# Patient Record
Sex: Male | Born: 1955 | Race: Black or African American | Hispanic: No | Marital: Married | Smoking: Never smoker
Health system: Southern US, Community
[De-identification: ages and names within clinical notes are randomized; demographics above are authoritative.]

## PROBLEM LIST (undated history)

## (undated) DIAGNOSIS — R05 Cough: Secondary | ICD-10-CM

## (undated) DIAGNOSIS — Z973 Presence of spectacles and contact lenses: Secondary | ICD-10-CM

## (undated) DIAGNOSIS — E739 Lactose intolerance, unspecified: Secondary | ICD-10-CM

## (undated) DIAGNOSIS — M549 Dorsalgia, unspecified: Secondary | ICD-10-CM

## (undated) DIAGNOSIS — M545 Low back pain, unspecified: Secondary | ICD-10-CM

## (undated) DIAGNOSIS — E119 Type 2 diabetes mellitus without complications: Secondary | ICD-10-CM

## (undated) DIAGNOSIS — M791 Myalgia, unspecified site: Secondary | ICD-10-CM

## (undated) DIAGNOSIS — G8929 Other chronic pain: Secondary | ICD-10-CM

## (undated) DIAGNOSIS — C61 Malignant neoplasm of prostate: Secondary | ICD-10-CM

## (undated) DIAGNOSIS — L853 Xerosis cutis: Secondary | ICD-10-CM

## (undated) DIAGNOSIS — J301 Allergic rhinitis due to pollen: Secondary | ICD-10-CM

## (undated) DIAGNOSIS — M7989 Other specified soft tissue disorders: Secondary | ICD-10-CM

## (undated) DIAGNOSIS — Z91018 Allergy to other foods: Secondary | ICD-10-CM

## (undated) DIAGNOSIS — E785 Hyperlipidemia, unspecified: Secondary | ICD-10-CM

## (undated) DIAGNOSIS — M436 Torticollis: Secondary | ICD-10-CM

## (undated) DIAGNOSIS — R059 Cough, unspecified: Secondary | ICD-10-CM

## (undated) DIAGNOSIS — H43399 Other vitreous opacities, unspecified eye: Secondary | ICD-10-CM

## (undated) DIAGNOSIS — H409 Unspecified glaucoma: Secondary | ICD-10-CM

## (undated) DIAGNOSIS — K219 Gastro-esophageal reflux disease without esophagitis: Secondary | ICD-10-CM

## (undated) DIAGNOSIS — G479 Sleep disorder, unspecified: Secondary | ICD-10-CM

## (undated) DIAGNOSIS — N529 Male erectile dysfunction, unspecified: Secondary | ICD-10-CM

## (undated) DIAGNOSIS — Z972 Presence of dental prosthetic device (complete) (partial): Secondary | ICD-10-CM

## (undated) DIAGNOSIS — N189 Chronic kidney disease, unspecified: Secondary | ICD-10-CM

## (undated) DIAGNOSIS — L299 Pruritus, unspecified: Secondary | ICD-10-CM

## (undated) DIAGNOSIS — H539 Unspecified visual disturbance: Secondary | ICD-10-CM

## (undated) DIAGNOSIS — G629 Polyneuropathy, unspecified: Secondary | ICD-10-CM

## (undated) DIAGNOSIS — M2550 Pain in unspecified joint: Secondary | ICD-10-CM

## (undated) DIAGNOSIS — E559 Vitamin D deficiency, unspecified: Secondary | ICD-10-CM

## (undated) DIAGNOSIS — G4730 Sleep apnea, unspecified: Secondary | ICD-10-CM

## (undated) DIAGNOSIS — R972 Elevated prostate specific antigen [PSA]: Secondary | ICD-10-CM

## (undated) DIAGNOSIS — R21 Rash and other nonspecific skin eruption: Secondary | ICD-10-CM

## (undated) DIAGNOSIS — R252 Cramp and spasm: Secondary | ICD-10-CM

## (undated) DIAGNOSIS — N289 Disorder of kidney and ureter, unspecified: Secondary | ICD-10-CM

## (undated) DIAGNOSIS — M1990 Unspecified osteoarthritis, unspecified site: Secondary | ICD-10-CM

## (undated) DIAGNOSIS — I10 Essential (primary) hypertension: Secondary | ICD-10-CM

## (undated) DIAGNOSIS — R12 Heartburn: Secondary | ICD-10-CM

## (undated) HISTORY — DX: Torticollis: M43.6

## (undated) HISTORY — DX: Cough: R05

## (undated) HISTORY — DX: Other vitreous opacities, unspecified eye: H43.399

## (undated) HISTORY — DX: Pruritus, unspecified: L29.9

## (undated) HISTORY — DX: Unspecified visual disturbance: H53.9

## (undated) HISTORY — DX: Myalgia, unspecified site: M79.10

## (undated) HISTORY — DX: Allergy to other foods: Z91.018

## (undated) HISTORY — DX: Sleep disorder, unspecified: G47.9

## (undated) HISTORY — DX: Chronic kidney disease, unspecified: N18.9

## (undated) HISTORY — DX: Other specified soft tissue disorders: M79.89

## (undated) HISTORY — PX: COLONOSCOPY: SHX174

## (undated) HISTORY — PX: EYE SURGERY: SHX253

## (undated) HISTORY — DX: Cough, unspecified: R05.9

## (undated) HISTORY — DX: Type 2 diabetes mellitus without complications: E11.9

## (undated) HISTORY — DX: Rash and other nonspecific skin eruption: R21

## (undated) HISTORY — DX: Allergic rhinitis due to pollen: J30.1

## (undated) HISTORY — DX: Disorder of kidney and ureter, unspecified: N28.9

## (undated) HISTORY — DX: Pain in unspecified joint: M25.50

## (undated) HISTORY — PX: PROSTATE BIOPSY: SHX241

## (undated) HISTORY — DX: Low back pain: M54.5

## (undated) HISTORY — DX: Xerosis cutis: L85.3

## (undated) HISTORY — DX: Vitamin D deficiency, unspecified: E55.9

## (undated) HISTORY — PX: DENTAL SURGERY: SHX609

## (undated) HISTORY — DX: Hyperlipidemia, unspecified: E78.5

## (undated) HISTORY — DX: Heartburn: R12

## (undated) HISTORY — DX: Cramp and spasm: R25.2

## (undated) HISTORY — DX: Lactose intolerance, unspecified: E73.9

## (undated) HISTORY — DX: Low back pain, unspecified: M54.50

---

## 1997-12-12 ENCOUNTER — Ambulatory Visit (HOSPITAL_COMMUNITY): Admission: RE | Admit: 1997-12-12 | Discharge: 1997-12-12 | Payer: Self-pay | Admitting: Nephrology

## 1998-01-04 ENCOUNTER — Emergency Department (HOSPITAL_COMMUNITY): Admission: EM | Admit: 1998-01-04 | Discharge: 1998-01-04 | Payer: Self-pay | Admitting: Emergency Medicine

## 1998-02-12 ENCOUNTER — Ambulatory Visit (HOSPITAL_COMMUNITY): Admission: RE | Admit: 1998-02-12 | Discharge: 1998-02-12 | Payer: Self-pay | Admitting: Nephrology

## 1998-04-10 ENCOUNTER — Ambulatory Visit (HOSPITAL_COMMUNITY): Admission: RE | Admit: 1998-04-10 | Discharge: 1998-04-10 | Payer: Self-pay | Admitting: Neurosurgery

## 1998-04-24 ENCOUNTER — Ambulatory Visit (HOSPITAL_COMMUNITY): Admission: RE | Admit: 1998-04-24 | Discharge: 1998-04-24 | Payer: Self-pay | Admitting: Neurosurgery

## 1998-05-08 ENCOUNTER — Ambulatory Visit (HOSPITAL_COMMUNITY): Admission: RE | Admit: 1998-05-08 | Discharge: 1998-05-08 | Payer: Self-pay | Admitting: Neurosurgery

## 1998-09-08 ENCOUNTER — Encounter: Admission: RE | Admit: 1998-09-08 | Discharge: 1998-12-07 | Payer: Self-pay

## 1998-09-30 ENCOUNTER — Emergency Department (HOSPITAL_COMMUNITY): Admission: EM | Admit: 1998-09-30 | Discharge: 1998-09-30 | Payer: Self-pay | Admitting: Emergency Medicine

## 1998-09-30 ENCOUNTER — Encounter: Payer: Self-pay | Admitting: Emergency Medicine

## 2000-02-04 ENCOUNTER — Encounter: Payer: Self-pay | Admitting: Nephrology

## 2000-02-04 ENCOUNTER — Ambulatory Visit (HOSPITAL_COMMUNITY): Admission: RE | Admit: 2000-02-04 | Discharge: 2000-02-04 | Payer: Self-pay | Admitting: Nephrology

## 2000-02-18 ENCOUNTER — Ambulatory Visit (HOSPITAL_COMMUNITY): Admission: RE | Admit: 2000-02-18 | Discharge: 2000-02-18 | Payer: Self-pay | Admitting: Nephrology

## 2000-02-18 ENCOUNTER — Encounter: Payer: Self-pay | Admitting: Nephrology

## 2000-03-03 ENCOUNTER — Ambulatory Visit (HOSPITAL_COMMUNITY): Admission: RE | Admit: 2000-03-03 | Discharge: 2000-03-03 | Payer: Self-pay | Admitting: Nephrology

## 2000-03-03 ENCOUNTER — Encounter: Payer: Self-pay | Admitting: Nephrology

## 2000-11-20 ENCOUNTER — Ambulatory Visit (HOSPITAL_BASED_OUTPATIENT_CLINIC_OR_DEPARTMENT_OTHER): Admission: RE | Admit: 2000-11-20 | Discharge: 2000-11-20 | Payer: Self-pay | Admitting: Nephrology

## 2001-02-06 ENCOUNTER — Ambulatory Visit (HOSPITAL_BASED_OUTPATIENT_CLINIC_OR_DEPARTMENT_OTHER): Admission: RE | Admit: 2001-02-06 | Discharge: 2001-02-06 | Payer: Self-pay | Admitting: Nephrology

## 2013-01-22 ENCOUNTER — Emergency Department (HOSPITAL_COMMUNITY): Payer: Medicare Other

## 2013-01-22 ENCOUNTER — Emergency Department (HOSPITAL_COMMUNITY)
Admission: EM | Admit: 2013-01-22 | Discharge: 2013-01-22 | Disposition: A | Discharge status: Home / Self Care | Payer: Medicare Other | Attending: Emergency Medicine | Admitting: Emergency Medicine

## 2013-01-22 ENCOUNTER — Encounter (HOSPITAL_COMMUNITY): Payer: Self-pay | Admitting: Emergency Medicine

## 2013-01-22 DIAGNOSIS — Y929 Unspecified place or not applicable: Secondary | ICD-10-CM | POA: Insufficient documentation

## 2013-01-22 DIAGNOSIS — I10 Essential (primary) hypertension: Secondary | ICD-10-CM | POA: Insufficient documentation

## 2013-01-22 DIAGNOSIS — Z79899 Other long term (current) drug therapy: Secondary | ICD-10-CM | POA: Insufficient documentation

## 2013-01-22 DIAGNOSIS — H409 Unspecified glaucoma: Secondary | ICD-10-CM | POA: Insufficient documentation

## 2013-01-22 DIAGNOSIS — S82001A Unspecified fracture of right patella, initial encounter for closed fracture: Secondary | ICD-10-CM

## 2013-01-22 DIAGNOSIS — Z7982 Long term (current) use of aspirin: Secondary | ICD-10-CM | POA: Insufficient documentation

## 2013-01-22 DIAGNOSIS — W19XXXA Unspecified fall, initial encounter: Secondary | ICD-10-CM | POA: Insufficient documentation

## 2013-01-22 DIAGNOSIS — Y9389 Activity, other specified: Secondary | ICD-10-CM | POA: Insufficient documentation

## 2013-01-22 DIAGNOSIS — S82009A Unspecified fracture of unspecified patella, initial encounter for closed fracture: Secondary | ICD-10-CM | POA: Insufficient documentation

## 2013-01-22 HISTORY — DX: Essential (primary) hypertension: I10

## 2013-01-22 HISTORY — DX: Unspecified glaucoma: H40.9

## 2013-01-22 MED ORDER — OXYCODONE-ACETAMINOPHEN 5-325 MG PO TABS: 1.0000 | ORAL_TABLET | ORAL | Status: DC | PRN

## 2013-01-22 MED ORDER — IBUPROFEN 200 MG PO TABS
600.0000 mg | ORAL_TABLET | Freq: Once | ORAL | Status: AC
  Administered 2013-01-22: 600 mg via ORAL
  Filled 2013-01-22: qty 3

## 2013-01-22 MED ORDER — OXYCODONE-ACETAMINOPHEN 5-325 MG PO TABS
2.0000 | ORAL_TABLET | Freq: Once | ORAL | Status: AC
  Administered 2013-01-22: 2 via ORAL
  Filled 2013-01-22: qty 2
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
## 2013-01-23 ENCOUNTER — Other Ambulatory Visit: Payer: Self-pay | Admitting: Orthopedic Surgery

## 2013-01-23 ENCOUNTER — Encounter (HOSPITAL_BASED_OUTPATIENT_CLINIC_OR_DEPARTMENT_OTHER): Payer: Self-pay | Admitting: *Deleted

## 2013-01-23 NOTE — Progress Notes (Signed)
Pt goes to the [facility]-dr woods-has been a yr-no cardiology problems-does have sleep apnea and uses a cpap-to bring all meds and cpap and overnight bag in case anesthesia wants him to stay overnight -will plan to go home if ok-will need istat-ekg-add on here after 430pm

## 2013-01-23 NOTE — Progress Notes (Signed)
Pt has had very little surgeries-no problems
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
## 2013-01-24 ENCOUNTER — Ambulatory Visit (HOSPITAL_BASED_OUTPATIENT_CLINIC_OR_DEPARTMENT_OTHER): Payer: Medicare Other | Admitting: *Deleted

## 2013-01-24 ENCOUNTER — Encounter (HOSPITAL_BASED_OUTPATIENT_CLINIC_OR_DEPARTMENT_OTHER): Payer: Self-pay | Admitting: *Deleted

## 2013-01-24 ENCOUNTER — Ambulatory Visit (HOSPITAL_BASED_OUTPATIENT_CLINIC_OR_DEPARTMENT_OTHER)
Admission: RE | Admit: 2013-01-24 | Discharge: 2013-01-24 | Disposition: A | Discharge status: Home / Self Care | Payer: Medicare Other | Source: Ambulatory Visit | Attending: Orthopedic Surgery | Admitting: Orthopedic Surgery

## 2013-01-24 ENCOUNTER — Encounter (HOSPITAL_BASED_OUTPATIENT_CLINIC_OR_DEPARTMENT_OTHER)
Admission: RE | Disposition: A | Discharge status: Home / Self Care | Payer: Self-pay | Source: Ambulatory Visit | Attending: Orthopedic Surgery

## 2013-01-24 DIAGNOSIS — Z7982 Long term (current) use of aspirin: Secondary | ICD-10-CM | POA: Insufficient documentation

## 2013-01-24 DIAGNOSIS — M545 Low back pain, unspecified: Secondary | ICD-10-CM | POA: Insufficient documentation

## 2013-01-24 DIAGNOSIS — H409 Unspecified glaucoma: Secondary | ICD-10-CM | POA: Insufficient documentation

## 2013-01-24 DIAGNOSIS — I10 Essential (primary) hypertension: Secondary | ICD-10-CM | POA: Insufficient documentation

## 2013-01-24 DIAGNOSIS — G4730 Sleep apnea, unspecified: Secondary | ICD-10-CM | POA: Insufficient documentation

## 2013-01-24 DIAGNOSIS — N289 Disorder of kidney and ureter, unspecified: Secondary | ICD-10-CM | POA: Insufficient documentation

## 2013-01-24 DIAGNOSIS — S82001A Unspecified fracture of right patella, initial encounter for closed fracture: Secondary | ICD-10-CM

## 2013-01-24 DIAGNOSIS — S82009A Unspecified fracture of unspecified patella, initial encounter for closed fracture: Secondary | ICD-10-CM | POA: Insufficient documentation

## 2013-01-24 DIAGNOSIS — Y9389 Activity, other specified: Secondary | ICD-10-CM | POA: Insufficient documentation

## 2013-01-24 DIAGNOSIS — K219 Gastro-esophageal reflux disease without esophagitis: Secondary | ICD-10-CM | POA: Insufficient documentation

## 2013-01-24 DIAGNOSIS — Z79899 Other long term (current) drug therapy: Secondary | ICD-10-CM | POA: Insufficient documentation

## 2013-01-24 DIAGNOSIS — W010XXA Fall on same level from slipping, tripping and stumbling without subsequent striking against object, initial encounter: Secondary | ICD-10-CM | POA: Insufficient documentation

## 2013-01-24 HISTORY — DX: Presence of dental prosthetic device (complete) (partial): Z97.2

## 2013-01-24 HISTORY — DX: Other chronic pain: G89.29

## 2013-01-24 HISTORY — DX: Polyneuropathy, unspecified: G62.9

## 2013-01-24 HISTORY — DX: Dorsalgia, unspecified: M54.9

## 2013-01-24 HISTORY — DX: Unspecified osteoarthritis, unspecified site: M19.90

## 2013-01-24 HISTORY — PX: ORIF PATELLA: SHX5033

## 2013-01-24 HISTORY — DX: Gastro-esophageal reflux disease without esophagitis: K21.9

## 2013-01-24 HISTORY — DX: Sleep apnea, unspecified: G47.30

## 2013-01-24 HISTORY — DX: Presence of spectacles and contact lenses: Z97.3

## 2013-01-24 LAB — POCT I-STAT, CHEM 8
Creatinine, Ser: 1.2 mg/dL (ref 0.50–1.35)
HCT: 44 % (ref 39.0–52.0)
Hemoglobin: 15 g/dL (ref 13.0–17.0)
Potassium: 3.8 mEq/L (ref 3.5–5.1)
Sodium: 141 mEq/L (ref 135–145)
TCO2: 25 mmol/L (ref 0–100)

## 2013-01-24 SURGERY — OPEN REDUCTION INTERNAL FIXATION (ORIF) PATELLA
Anesthesia: General | Site: Knee | Laterality: Right | Wound class: Clean

## 2013-01-24 MED ORDER — OXYCODONE-ACETAMINOPHEN 5-325 MG PO TABS
1.0000 | ORAL_TABLET | ORAL | Status: DC | PRN
Start: 2013-01-24 — End: 2013-06-11

## 2013-01-24 MED ORDER — DEXAMETHASONE SODIUM PHOSPHATE 10 MG/ML IJ SOLN
INTRAMUSCULAR | Status: DC | PRN
  Administered 2013-01-24: 10 mg via INTRAVENOUS

## 2013-01-24 MED ORDER — OXYCODONE HCL 5 MG PO TABS: 5.0000 mg | ORAL_TABLET | Freq: Once | ORAL | Status: DC | PRN

## 2013-01-24 MED ORDER — FENTANYL CITRATE 0.05 MG/ML IJ SOLN
INTRAMUSCULAR | Status: DC | PRN
  Administered 2013-01-24 (×4): 25 ug via INTRAVENOUS

## 2013-01-24 MED ORDER — LIDOCAINE HCL (CARDIAC) 20 MG/ML IV SOLN
INTRAVENOUS | Status: DC | PRN
  Administered 2013-01-24: 60 mg via INTRAVENOUS

## 2013-01-24 MED ORDER — METOCLOPRAMIDE HCL 5 MG/ML IJ SOLN: 10.0000 mg | Freq: Once | INTRAMUSCULAR | Status: DC | PRN

## 2013-01-24 MED ORDER — BUPIVACAINE-EPINEPHRINE PF 0.5-1:200000 % IJ SOLN
INTRAMUSCULAR | Status: DC | PRN
  Administered 2013-01-24: 25 mL

## 2013-01-24 MED ORDER — HYDROMORPHONE HCL PF 1 MG/ML IJ SOLN: 0.2500 mg | INTRAMUSCULAR | Status: DC | PRN

## 2013-01-24 MED ORDER — OXYCODONE HCL 5 MG/5ML PO SOLN: 5.0000 mg | Freq: Once | ORAL | Status: DC | PRN

## 2013-01-24 MED ORDER — DEXTROSE-NACL 5-0.45 % IV SOLN: INTRAVENOUS | Status: DC

## 2013-01-24 MED ORDER — ONDANSETRON HCL 4 MG/2ML IJ SOLN
INTRAMUSCULAR | Status: DC | PRN
  Administered 2013-01-24: 4 mg via INTRAVENOUS

## 2013-01-24 MED ORDER — PROPOFOL 10 MG/ML IV BOLUS
INTRAVENOUS | Status: DC | PRN
  Administered 2013-01-24: 250 mg via INTRAVENOUS

## 2013-01-24 MED ORDER — LACTATED RINGERS IV SOLN
INTRAVENOUS | Status: DC
  Administered 2013-01-24 (×2): via INTRAVENOUS

## 2013-01-24 MED ORDER — FENTANYL CITRATE 0.05 MG/ML IJ SOLN
50.0000 ug | INTRAMUSCULAR | Status: DC | PRN
  Administered 2013-01-24: 100 ug via INTRAVENOUS

## 2013-01-24 MED ORDER — CEFAZOLIN SODIUM-DEXTROSE 2-3 GM-% IV SOLR
2.0000 g | INTRAVENOUS | Status: AC
  Administered 2013-01-24: 2 g via INTRAVENOUS

## 2013-01-24 MED ORDER — MIDAZOLAM HCL 2 MG/2ML IJ SOLN
1.0000 mg | INTRAMUSCULAR | Status: DC | PRN
  Administered 2013-01-24: 2 mg via INTRAVENOUS

## 2013-01-24 SURGICAL SUPPLY — 85 items
BANDAGE ELASTIC 4 VELCRO ST LF (GAUZE/BANDAGES/DRESSINGS) ×2 IMPLANT
BANDAGE ELASTIC 6 VELCRO ST LF (GAUZE/BANDAGES/DRESSINGS) IMPLANT
BANDAGE ESMARK 6X9 LF (GAUZE/BANDAGES/DRESSINGS) IMPLANT
BIT DRILL 3/32DIAX5INL DISPOSE (BIT) IMPLANT
BIT DRILL 3/32DX5IN DISP (BIT) ×1
BIT DRILL 5/64X5 DISP (BIT) ×1 IMPLANT
BIT DRILL CANN 3.5X160 QC (BIT) ×1 IMPLANT
BIT DRILL JACOB END 9/64INX5IN (BIT) ×1 IMPLANT
BLADE SURG 10 STRL SS (BLADE) ×3 IMPLANT
BLADE SURG 15 STRL LF DISP TIS (BLADE) ×1 IMPLANT
BLADE SURG 15 STRL SS (BLADE) ×2
BNDG CMPR 9X4 STRL LF SNTH (GAUZE/BANDAGES/DRESSINGS)
BNDG CMPR 9X6 STRL LF SNTH (GAUZE/BANDAGES/DRESSINGS) ×1
BNDG COHESIVE 4X5 TAN STRL (GAUZE/BANDAGES/DRESSINGS) ×2 IMPLANT
BNDG ESMARK 4X9 LF (GAUZE/BANDAGES/DRESSINGS) IMPLANT
BNDG ESMARK 6X9 LF (GAUZE/BANDAGES/DRESSINGS) ×2
CHLORAPREP W/TINT 26ML (MISCELLANEOUS) ×2 IMPLANT
COVER TABLE BACK 60X90 (DRAPES) ×1 IMPLANT
CUFF TOURNIQUET SINGLE 34IN LL (TOURNIQUET CUFF) ×1 IMPLANT
DECANTER SPIKE VIAL GLASS SM (MISCELLANEOUS) IMPLANT
DRAPE EXTREMITY T 121X128X90 (DRAPE) ×2 IMPLANT
DRAPE INCISE IOBAN 66X45 STRL (DRAPES) IMPLANT
DRAPE OEC MINIVIEW 54X84 (DRAPES) ×2 IMPLANT
DRAPE U 20/CS (DRAPES) ×1 IMPLANT
DRAPE U-SHAPE 47X51 STRL (DRAPES) ×2 IMPLANT
DRILL BIT 3/32DIAX5INL DISPOSE (BIT) ×2
ELECT REM PT RETURN 9FT ADLT (ELECTROSURGICAL) ×2
ELECTRODE REM PT RTRN 9FT ADLT (ELECTROSURGICAL) ×1 IMPLANT
GAUZE SPONGE 4X4 16PLY XRAY LF (GAUZE/BANDAGES/DRESSINGS) IMPLANT
GAUZE XEROFORM 1X8 LF (GAUZE/BANDAGES/DRESSINGS) ×2 IMPLANT
GLOVE BIO SURGEON STRL SZ7 (GLOVE) ×2 IMPLANT
GLOVE BIO SURGEON STRL SZ7.5 (GLOVE) ×2 IMPLANT
GLOVE BIOGEL PI IND STRL 7.0 (GLOVE) ×1 IMPLANT
GLOVE BIOGEL PI IND STRL 8 (GLOVE) ×1 IMPLANT
GLOVE BIOGEL PI INDICATOR 7.0 (GLOVE) ×1
GLOVE BIOGEL PI INDICATOR 8 (GLOVE) ×1
GOWN PREVENTION PLUS XLARGE (GOWN DISPOSABLE) ×2 IMPLANT
GUIDEWIRE THREADED 150MM (WIRE) ×3 IMPLANT
IMMOBILIZER KNEE 24 THIGH 36 (MISCELLANEOUS) IMPLANT
IMMOBILIZER KNEE 24 UNIV (MISCELLANEOUS)
KNEE WRAP E Z 3 GEL PACK (MISCELLANEOUS) IMPLANT
KWIRE 4.0 X .062IN (WIRE) IMPLANT
NDL 1/2 CIR CATGUT .05X1.09 (NEEDLE) IMPLANT
NDL HYPO 25X1 1.5 SAFETY (NEEDLE) IMPLANT
NEEDLE 1/2 CIR CATGUT .05X1.09 (NEEDLE) ×4 IMPLANT
NEEDLE HYPO 25X1 1.5 SAFETY (NEEDLE) IMPLANT
NS IRRIG 1000ML POUR BTL (IV SOLUTION) ×2 IMPLANT
PACK ARTHROSCOPY DSU (CUSTOM PROCEDURE TRAY) ×1 IMPLANT
PAD CAST 4YDX4 CTTN HI CHSV (CAST SUPPLIES) ×1 IMPLANT
PADDING CAST ABS 4INX4YD NS (CAST SUPPLIES) ×1
PADDING CAST ABS COTTON 4X4 ST (CAST SUPPLIES) ×1 IMPLANT
PADDING CAST COTTON 4X4 STRL (CAST SUPPLIES) ×2
PASSER SUT SWANSON 36MM LOOP (INSTRUMENTS) ×1 IMPLANT
PENCIL BUTTON HOLSTER BLD 10FT (ELECTRODE) ×2 IMPLANT
SCREW CANN P.T. 24MM (Screw) ×1 IMPLANT
SCREW CANN P.T. 30MM (Screw) ×2 IMPLANT
SLEEVE SCD COMPRESS KNEE MED (MISCELLANEOUS) IMPLANT
SPONGE GAUZE 4X4 12PLY (GAUZE/BANDAGES/DRESSINGS) ×2 IMPLANT
SPONGE LAP 18X18 X RAY DECT (DISPOSABLE) ×2 IMPLANT
SPONGE LAP 4X18 X RAY DECT (DISPOSABLE) IMPLANT
STAPLER VISISTAT 35W (STAPLE) IMPLANT
STOCKINETTE IMPERVIOUS LG (DRAPES) ×1 IMPLANT
SUCTION FRAZIER TIP 10 FR DISP (SUCTIONS) IMPLANT
SUT FIBERWIRE #2 38 T-5 BLUE (SUTURE) ×4
SUT FIBERWIRE #5 38 CONV NDL (SUTURE)
SUT MON AB 4-0 PC3 18 (SUTURE) IMPLANT
SUT VIC AB 0 CT1 27 (SUTURE) ×2
SUT VIC AB 0 CT1 27XBRD ANBCTR (SUTURE) ×1 IMPLANT
SUT VIC AB 1 CT1 27 (SUTURE) ×4
SUT VIC AB 1 CT1 27XBRD ANBCTR (SUTURE) IMPLANT
SUT VIC AB 2-0 CT1 27 (SUTURE)
SUT VIC AB 2-0 CT1 TAPERPNT 27 (SUTURE) IMPLANT
SUT VIC AB 2-0 SH 27 (SUTURE)
SUT VIC AB 2-0 SH 27XBRD (SUTURE) IMPLANT
SUT VIC AB 3-0 FS2 27 (SUTURE) ×1 IMPLANT
SUTURE FIBERWR #2 38 T-5 BLUE (SUTURE) IMPLANT
SUTURE FIBERWR #5 38 CONV NDL (SUTURE) IMPLANT
SYR BULB 3OZ (MISCELLANEOUS) ×2 IMPLANT
SYR BULB IRRIGATION 50ML (SYRINGE) ×2 IMPLANT
SYR CONTROL 10ML LL (SYRINGE) IMPLANT
TAP PITCH 25 3.5MM (TAP) ×1 IMPLANT
TOWEL OR 17X24 6PK STRL BLUE (TOWEL DISPOSABLE) ×6 IMPLANT
TUBE CONNECTING 20X1/4 (TUBING) IMPLANT
UNDERPAD 30X30 INCONTINENT (UNDERPADS AND DIAPERS) ×2 IMPLANT
YANKAUER SUCT BULB TIP NO VENT (SUCTIONS) ×2 IMPLANT

## 2013-01-24 NOTE — Progress Notes (Signed)
Assisted Dr. Gelene Mink with right, ultrasound guided, femoral block. Side rails up, monitors on throughout procedure. See vital signs in flow sheet. Tolerated Procedure well.

## 2013-01-24 NOTE — Op Note (Signed)
Pre Op Dx: Badly comminuted right patella fracture  Post Op Dx: Same  Procedure: Open reduction internal fixation right patella fracture using 3 distal to proximal parallel 3.5 mm cannulated screws and a #2 FiberWire running interlocking sutures.  Surgeon: Nestor Lewandowsky, MD  Assistant: Shirl Harris PA-C  Anesthesia: General, right femoral nerve block  EBL: Minimal  Fluids: 1500 cc of crystalloid  Tourniquet Time: One hour and 30 minutes  Indications: Patient fell 2 days ago and sustained a badly comminuted displaced right patellar fracture with a least 6 fragments the proximal fragment involved about half of the articular surface was in one piece. The distal fragments were in multiple pieces. In order to preserve function and decrease pain he is taken for open reduction and internal fixation risks and benefits of surgery discussed at length with the patient. The injury was closed and the skin is in good condition.  Procedure: Patient was identified by arm band and received preoperative IV antibiotics in the holding area as well as right femoral nerve block anesthesia. Taken to operating room one appropriate anesthetic monitors were attached and general LMA anesthesia induced with the patient in the supine position. Tourniquet applied high to the right thigh and right lower Charney prepped and draped in usual sterile fashion from the ankle to the tourniquet. Timeout procedure performed. Right lower trembly wrapped with an Esmarch bandage knee bent to 90 and tourniquet inflated to 350 mm of mercury. We began the operation by making an anterior midline incision starting 5 cm above the patella going over the patella and fracture and going down just distal to the tibial tubercle. Small bleeders in the skin and subcutaneous tissue identified and cauterized. We immediately encountered the fracture site as well as the medial and lateral retinacular tears. Soft tissue was trimmed back to the edge of  the proximal fracture and distally we noted 2 large fragments or amenable to lag screw fixation to the proximal fracture allowing [name] to reconstitute 90% of the articular surface. This was accomplished with 33.5 mm cannulated lag screws and good firm fixation was accomplished. We then set about repairing the smaller distal fragments which were still attached to the patellar tendon to the bed of the distal patella. This was accomplished using #2 FiberWire with medial to central running whipstitch and then a second one central to lateral running whipstitch. These were repaired back to the patella through parallel drill holes going through the repaired fragments and the major fragment and exiting proximally. After the 2 core sutures were tied the knee was taken through range of motion from 0-60 and there is no gapping noticed at the fracture or repair sites. We then took the suture over the top of the patella and placed 2 more whipstitch is in the tendon tying those distally doubling the strength of the repair. Photographic and x-ray documentation was made of the anatomic reduction of the articular surface tear at this point the wound is irrigated out normal saline solution the parapatellar arthrotomy and retinacular tears closed with running #1 Vicryl suture. The tourniquet was let down no significant bleeding was noted. The subcutaneous tissue was then closed with 0 and 3-0 undyed Vicryl suture with a subcuticular repair as the final layer. A dressing of Xerofoam 4 x 4 dressing sponges web roll Ace wrap and a knee immobilizer were applied. The patient was then awakened extubated and taken to the recovery without difficulty.

## 2013-01-24 NOTE — Transfer of Care (Signed)
Immediate Anesthesia Transfer of Care Note  Patient: Tanner Young  Procedure(s) Performed: Procedure(s) with comments: OPEN REDUCTION INTERNAL (ORIF) FIXATION PATELLA (Right) - orif right patella,patellectomy   Patient Location: PACU  Anesthesia Type:GA combined with regional for post-op pain  Level of Consciousness: sedated and patient cooperative  Airway & Oxygen Therapy: Patient Spontanous Breathing and Patient connected to face mask oxygen  Post-op Assessment: Report given to PACU RN and Post -op Vital signs reviewed and stable  Post vital signs: Reviewed and stable  Complications: No apparent anesthesia complications

## 2013-01-24 NOTE — Anesthesia Preprocedure Evaluation (Signed)
Anesthesia Evaluation  Patient identified by MRN, date of birth, ID band Patient awake    Reviewed: Allergy & Precautions, H&P , NPO status , Patient's Chart, lab work & pertinent test results, reviewed documented beta blocker date and time   Airway Mallampati: II TM Distance: >3 FB Neck ROM: full    Dental   Pulmonary sleep apnea ,  breath sounds clear to auscultation        Cardiovascular hypertension, On Medications Rhythm:regular     Neuro/Psych negative neurological ROS  negative psych ROS   GI/Hepatic negative GI ROS, Neg liver ROS, GERD-  Medicated and Controlled,  Endo/Other  negative endocrine ROS  Renal/GU negative Renal ROS  negative genitourinary   Musculoskeletal   Abdominal   Peds  Hematology negative hematology ROS (+)   Anesthesia Other Findings See surgeon's H&P   Reproductive/Obstetrics negative OB ROS                           Anesthesia Physical Anesthesia Plan  ASA: III  Anesthesia Plan: General   Post-op Pain Management:    Induction: Intravenous  Airway Management Planned: LMA  Additional Equipment:   Intra-op Plan:   Post-operative Plan: Extubation in OR  Informed Consent: I have reviewed the patients History and Physical, chart, labs and discussed the procedure including the risks, benefits and alternatives for the proposed anesthesia with the patient or authorized representative who has indicated his/her understanding and acceptance.   Dental Advisory Given  Plan Discussed with: CRNA and Surgeon  Anesthesia Plan Comments:         Anesthesia Quick Evaluation

## 2013-01-24 NOTE — Anesthesia Procedure Notes (Addendum)
Anesthesia Regional Block:  Femoral nerve block  Pre-Anesthetic Checklist: ,, timeout performed, Correct Patient, Correct Site, Correct Laterality, Correct Procedure, Correct Position, site marked, Risks and benefits discussed,  Surgical consent,  Pre-op evaluation,  At surgeon's request and post-op pain management  Laterality: Right  Prep: chloraprep       Needles:   Needle Type: Other     Needle Length: 9cm  Needle Gauge: 21    Additional Needles:  Procedures: ultrasound guided (picture in chart) and nerve stimulator Femoral nerve block  Nerve Stimulator or Paresthesia:  Response: quads, 0.3 mA,   Additional Responses:   Narrative:  Start time: 01/24/2013 12:35 PM End time: 01/24/2013 12:42 PM Injection made incrementally with aspirations every 5 mL.  Performed by: Personally  Anesthesiologist: C. Frederick MD  Additional Notes: Ultrasound guidance used to: id relevant anatomy, confirm needle position, local anesthetic spread, avoidance of vascular puncture. Picture saved. No complications. Block performed personally by Janetta Hora. Gelene Mink, MD    Femoral nerve block Procedure Name: LMA Insertion Date/Time: 01/24/2013 1:56 PM Performed by: Meyer Russel Pre-anesthesia Checklist: Patient identified, Emergency Drugs available, Suction available and Patient being monitored Patient Re-evaluated:Patient Re-evaluated prior to inductionOxygen Delivery Method: Circle System Utilized Preoxygenation: Pre-oxygenation with 100% oxygen Intubation Type: IV induction Ventilation: Mask ventilation without difficulty LMA: LMA inserted LMA Size: 5.0 Number of attempts: 1 Airway Equipment and Method: bite block Placement Confirmation: positive ETCO2 and breath sounds checked- equal and bilateral Tube secured with: Tape Dental Injury: Teeth and Oropharynx as per pre-operative assessment

## 2013-01-24 NOTE — Anesthesia Postprocedure Evaluation (Signed)
Anesthesia Post Note  Patient: Tanner Young  Procedure(s) Performed: Procedure(s) (LRB): OPEN REDUCTION INTERNAL (ORIF) FIXATION PATELLA (Right)  Anesthesia type: General  Patient location: PACU  Post pain: Pain level controlled  Post assessment: Patient's Cardiovascular Status Stable  Last Vitals:  Filed Vitals:   01/24/13 1637  BP:   Pulse: 71  Temp:   Resp: 14    Post vital signs: Reviewed and stable  Level of consciousness: alert  Complications: No apparent anesthesia complications

## 2013-01-24 NOTE — Interval H&P Note (Signed)
History and Physical Interval Note:  01/24/2013 12:38 PM  Tanner Young  has presented today for surgery, with the diagnosis of right patella fx  The various methods of treatment have been discussed with the patient and family. After consideration of risks, benefits and other options for treatment, the patient has consented to  Procedure(s) with comments: OPEN REDUCTION INTERNAL (ORIF) FIXATION PATELLA (Right) - orif right patella,patellectomy  as a surgical intervention .  The patient's history has been reviewed, patient examined, no change in status, stable for surgery.  I have reviewed the patient's chart and labs.  Questions were answered to the patient's satisfaction.     Nestor Lewandowsky

## 2013-01-25 ENCOUNTER — Encounter (HOSPITAL_BASED_OUTPATIENT_CLINIC_OR_DEPARTMENT_OTHER): Payer: Self-pay | Admitting: Orthopedic Surgery

## 2013-06-11 ENCOUNTER — Encounter (HOSPITAL_COMMUNITY): Payer: Self-pay | Admitting: Emergency Medicine

## 2013-06-11 ENCOUNTER — Emergency Department (HOSPITAL_COMMUNITY)
Admission: EM | Admit: 2013-06-11 | Discharge: 2013-06-11 | Disposition: A | Discharge status: Home / Self Care | Payer: Medicare Other | Attending: Emergency Medicine | Admitting: Emergency Medicine

## 2013-06-11 ENCOUNTER — Emergency Department (HOSPITAL_COMMUNITY): Payer: Medicare Other

## 2013-06-11 DIAGNOSIS — W010XXA Fall on same level from slipping, tripping and stumbling without subsequent striking against object, initial encounter: Secondary | ICD-10-CM | POA: Insufficient documentation

## 2013-06-11 DIAGNOSIS — Y929 Unspecified place or not applicable: Secondary | ICD-10-CM | POA: Insufficient documentation

## 2013-06-11 DIAGNOSIS — K219 Gastro-esophageal reflux disease without esophagitis: Secondary | ICD-10-CM | POA: Insufficient documentation

## 2013-06-11 DIAGNOSIS — Z79899 Other long term (current) drug therapy: Secondary | ICD-10-CM | POA: Insufficient documentation

## 2013-06-11 DIAGNOSIS — M129 Arthropathy, unspecified: Secondary | ICD-10-CM | POA: Insufficient documentation

## 2013-06-11 DIAGNOSIS — I10 Essential (primary) hypertension: Secondary | ICD-10-CM | POA: Insufficient documentation

## 2013-06-11 DIAGNOSIS — S82209A Unspecified fracture of shaft of unspecified tibia, initial encounter for closed fracture: Secondary | ICD-10-CM | POA: Insufficient documentation

## 2013-06-11 DIAGNOSIS — G4730 Sleep apnea, unspecified: Secondary | ICD-10-CM | POA: Insufficient documentation

## 2013-06-11 DIAGNOSIS — S82191A Other fracture of upper end of right tibia, initial encounter for closed fracture: Secondary | ICD-10-CM

## 2013-06-11 DIAGNOSIS — Y9389 Activity, other specified: Secondary | ICD-10-CM | POA: Insufficient documentation

## 2013-06-11 DIAGNOSIS — Z7982 Long term (current) use of aspirin: Secondary | ICD-10-CM | POA: Insufficient documentation

## 2013-06-11 DIAGNOSIS — G589 Mononeuropathy, unspecified: Secondary | ICD-10-CM | POA: Insufficient documentation

## 2013-06-11 DIAGNOSIS — G8929 Other chronic pain: Secondary | ICD-10-CM | POA: Insufficient documentation

## 2013-06-11 DIAGNOSIS — W108XXA Fall (on) (from) other stairs and steps, initial encounter: Secondary | ICD-10-CM | POA: Insufficient documentation

## 2013-06-11 MED ORDER — OXYCODONE-ACETAMINOPHEN 5-325 MG PO TABS: 1.0000 | ORAL_TABLET | Freq: Four times a day (QID) | ORAL | Status: DC | PRN

## 2013-06-11 NOTE — ED Notes (Signed)
Patient transported to X-ray 

## 2013-06-11 NOTE — ED Notes (Addendum)
Pt  missed a step while going downstairs, felt a "pop" in his R knee and tumbled down 1 step.  No loc.   Denies back, neck pain.  Hx of surgery to R knee in June and is still in therapy.  Per ems, only minor abrasions.  Though pt has abrasions to L knee, it is his R knee that hurts.

## 2013-06-11 NOTE — Progress Notes (Signed)
Orthopedic Tech Progress Note Patient Details:  Tanner Young 15-May-1956 [id number]  Ortho Devices Type of Ortho Device: Knee Immobilizer Ortho Device/Splint Interventions: Application   Shawnie Pons 06/11/2013, 3:39 PM

## 2013-06-11 NOTE — ED Notes (Signed)
Patient is alert and orientedx4.  Patient was explained discharge instructions and they understood them with no questions.  The patient's wife, Eriel Doyon is taking the patient home.

## 2013-06-11 NOTE — ED Provider Notes (Signed)
CSN: [id number]     Arrival date & time 06/11/13  1040 History   First MD Initiated Contact with Patient 06/11/13 1051     Chief Complaint  Patient presents with  . Fall  . Knee Pain   (Consider location/radiation/quality/duration/timing/severity/associated sxs/prior Treatment) Patient is a 57 y.o. male presenting with fall and knee pain. The history is provided by the patient and a relative. No language interpreter was used.  Fall This is a new problem. The current episode started less than 1 hour ago. The problem occurs rarely. The problem has not changed since onset.Pertinent negatives include no chest pain, no abdominal pain, no headaches and no shortness of breath. Nothing aggravates the symptoms. Nothing relieves the symptoms. He has tried nothing for the symptoms. The treatment provided no relief.  Knee Pain Location:  Knee Time since incident:  1 hour Injury: yes   Mechanism of injury: fall   Fall:    Fall occurred:  Down stairs   Height of fall:  3 ft   Impact surface:  Hard floor   Point of impact:  Knees   Entrapped after fall: no   Knee location:  R knee Pain details:    Quality:  Dull   Radiates to:  Does not radiate   Duration:  1 hour   Timing:  Constant   Progression:  Unchanged Chronicity:  Recurrent Dislocation: no   Foreign body present:  No foreign bodies Tetanus status:  Up to date Relieved by:  Rest Worsened by:  Bearing weight and flexion Associated symptoms: no back pain, no fatigue and no fever   Risk factors comment:  Recent patella fracuture   Past Medical History  Diagnosis Date  . Hypertension   . Glaucoma, both eyes   . Sleep apnea     uses a cpap  . Arthritis   . Chronic back pain   . Wears glasses   . Wears partial dentures     top mid partial-flipper  . Neuropathy     rt leg from DDD  . GERD (gastroesophageal reflux disease)    Past Surgical History  Procedure Laterality Date  . Dental surgery      extractions  . Colonoscopy     . Orif patella Right 01/24/2013    Procedure: OPEN REDUCTION INTERNAL (ORIF) FIXATION PATELLA;  Surgeon: Nestor Lewandowsky, MD;  Location: [facility];  Service: Orthopedics;  Laterality: Right;  orif right patella,patellectomy    No family history on file. History  Substance Use Topics  . Smoking status: Never Smoker   . Smokeless tobacco: Not on file  . Alcohol Use: No    Review of Systems  Constitutional: Negative for fever, activity change, appetite change and fatigue.  HENT: Negative for congestion, facial swelling, rhinorrhea and trouble swallowing.   Eyes: Negative for photophobia and pain.  Respiratory: Negative for cough, chest tightness and shortness of breath.   Cardiovascular: Negative for chest pain and leg swelling.  Gastrointestinal: Negative for nausea, vomiting, abdominal pain, diarrhea and constipation.  Endocrine: Negative for polydipsia and polyuria.  Genitourinary: Negative for dysuria, urgency, decreased urine volume and difficulty urinating.  Musculoskeletal: Negative for back pain and gait problem.  Skin: Negative for color change, rash and wound.  Allergic/Immunologic: Negative for immunocompromised state.  Neurological: Negative for dizziness, facial asymmetry, speech difficulty, weakness, numbness and headaches.  Psychiatric/Behavioral: Negative for confusion, decreased concentration and agitation.    Allergies  Shellfish allergy  Home Medications   Current Outpatient  Rx  Name  Route  Sig  Dispense  Refill  . amLODipine (NORVASC) 10 MG tablet   Oral   Take 10 mg by mouth daily.          [name] aspirin EC 81 MG tablet   Oral   Take 81 mg by mouth at bedtime.          . benazepril (LOTENSIN) 40 MG tablet   Oral   Take 20 mg by mouth daily.         . brimonidine (ALPHAGAN) 0.15 % ophthalmic solution   Both Eyes   Place 1 drop into both eyes 2 (two) times daily at 10 AM and 5 PM.         . cholecalciferol (VITAMIN D) 1000 UNITS  tablet   Oral   Take 1,000 Units by mouth daily.         . dorzolamide-timolol (COSOPT) 22.3-6.8 MG/ML ophthalmic solution   Both Eyes   Place 1 drop into both eyes 2 (two) times daily.          [name] gabapentin (NEURONTIN) 600 MG tablet   Oral   Take 600 mg by mouth 3 (three) times daily as needed (for nerve pain).         [name] HYDROcodone-acetaminophen (NORCO/VICODIN) 5-325 MG per tablet   Oral   Take 1 tablet by mouth every 6 (six) hours as needed for pain.         [name] ketotifen (REFRESH EYE ITCH RELIEF) 0.025 % ophthalmic solution   Both Eyes   Place 1 drop into both eyes daily as needed (for eyes).         [name] latanoprost (XALATAN) 0.005 % ophthalmic solution   Both Eyes   Place 1 drop into both eyes at bedtime.         [name] omeprazole (PRILOSEC) 20 MG capsule   Oral   Take 20 mg by mouth at bedtime.         [name] PRESCRIPTION MEDICATION   Topical   Apply 1 application topically at bedtime. "face wash"         . PRESCRIPTION MEDICATION   Topical   Apply 1 application topically daily. Shampoo to head         . PRESCRIPTION MEDICATION   Topical   Apply 1 application topically daily. Cream to feet         . PRESCRIPTION MEDICATION   Topical   Apply 1 application topically daily. Cream to back         . Rizatriptan Benzoate (MAXALT PO)   Oral   Take 1 capsule by mouth daily.         . simvastatin (ZOCOR) 20 MG tablet   Oral   Take 10 mg by mouth at bedtime.         . triamterene-hydrochlorothiazide (MAXZIDE) 75-50 MG per tablet   Oral   Take 0.5 tablets by mouth daily.         . Zinc Oxide (DESITIN CREAMY EX)   Apply externally   Apply 1 application topically daily as needed (to face and chest).         [name] oxyCODONE-acetaminophen (PERCOCET) 5-325 MG per tablet   Oral   Take 1 tablet by mouth every 6 (six) hours as needed for pain.   10 tablet   0    BP 104/70  Pulse 53  Temp(Src) 98.5 F (36.9 C) (Oral)  Resp 18  Ht 5\' 10"  (1.778  m)   Wt 250 lb (113.399 kg)  BMI 35.87 kg/m2  SpO2 99% Physical Exam  Constitutional: He is oriented to person, place, and time. He appears well-developed and well-nourished. No distress.  HENT:  Head: Normocephalic and atraumatic.  Mouth/Throat: No oropharyngeal exudate.  Eyes: Pupils are equal, round, and reactive to light.  Neck: Normal range of motion. Neck supple.  Cardiovascular: Normal rate, regular rhythm and normal heart sounds.  Exam reveals no gallop and no friction rub.   No murmur heard. Pulmonary/Chest: Effort normal and breath sounds normal. No respiratory distress. He has no wheezes. He has no rales.  Abdominal: Soft. Bowel sounds are normal. He exhibits no distension and no mass. There is no tenderness. There is no rebound and no guarding.  Musculoskeletal: Normal range of motion. He exhibits no edema.       Right knee: Tenderness found. Lateral joint line and patellar tendon tenderness noted.  Neurological: He is alert and oriented to person, place, and time.  Skin: Skin is warm and dry.  Psychiatric: He has a normal mood and affect.    ED Course  Procedures (including critical care time) Labs Review Labs Reviewed - No data to display Imaging Review Dg Knee Complete 4 Views Right  06/11/2013   CLINICAL DATA:  Recent traumatic injury with knee pain  EXAM: RIGHT KNEE - COMPLETE 4+ VIEW  COMPARISON:  01/22/2013  FINDINGS: There are changes consistent with the patient's known history of prior patellar fracture with fixation. Multiple fixation screws are seen. A lucency is noted in the tibial tubercle which is new from the prior exam which may be related to the most recent injury. Correlation to point tenderness is recommended. No other focal abnormality is seen.  IMPRESSION: Postoperative changes.  Questionable avulsion fracture from the tibial tubercle. Clinical correlation is recommended.   Electronically Signed   By: Alcide Clever M.D.   On: 06/11/2013 12:07    EKG  Interpretation   None       MDM   1. Fracture of tibial tubercle, right, initial encounter    Pt is a 57 y.o. male with Pmhx as above who presents with R knee pain after a mechanical fall just PTA down 1 step.  Otherwise well.  +ttp over lateral knee line & patella.  Hx of patellar fx.  NVI distally.  XR shows stable patella, questionable avulsion fracture from the tibial tubercle. He does have ttp over this site.   Have spoken w/ pt's orthopedist, Dr. Turner Daniels who will see him in office this week, recommends knee immobilizer.  Return precautions given for new or worsening symptoms including worsening pain, redness, fever.         Shanna Cisco, MD 06/11/13 1622

## 2014-06-28 IMAGING — CR DG KNEE COMPLETE 4+V*R*
4 series · 4 of 4 positions shown · non-contrast
Comparison: None.

CLINICAL DATA: Fall, anterior knee pain, patellar fracture

RIGHT KNEE - COMPLETE 4+ VIEW

[x knee ap right]
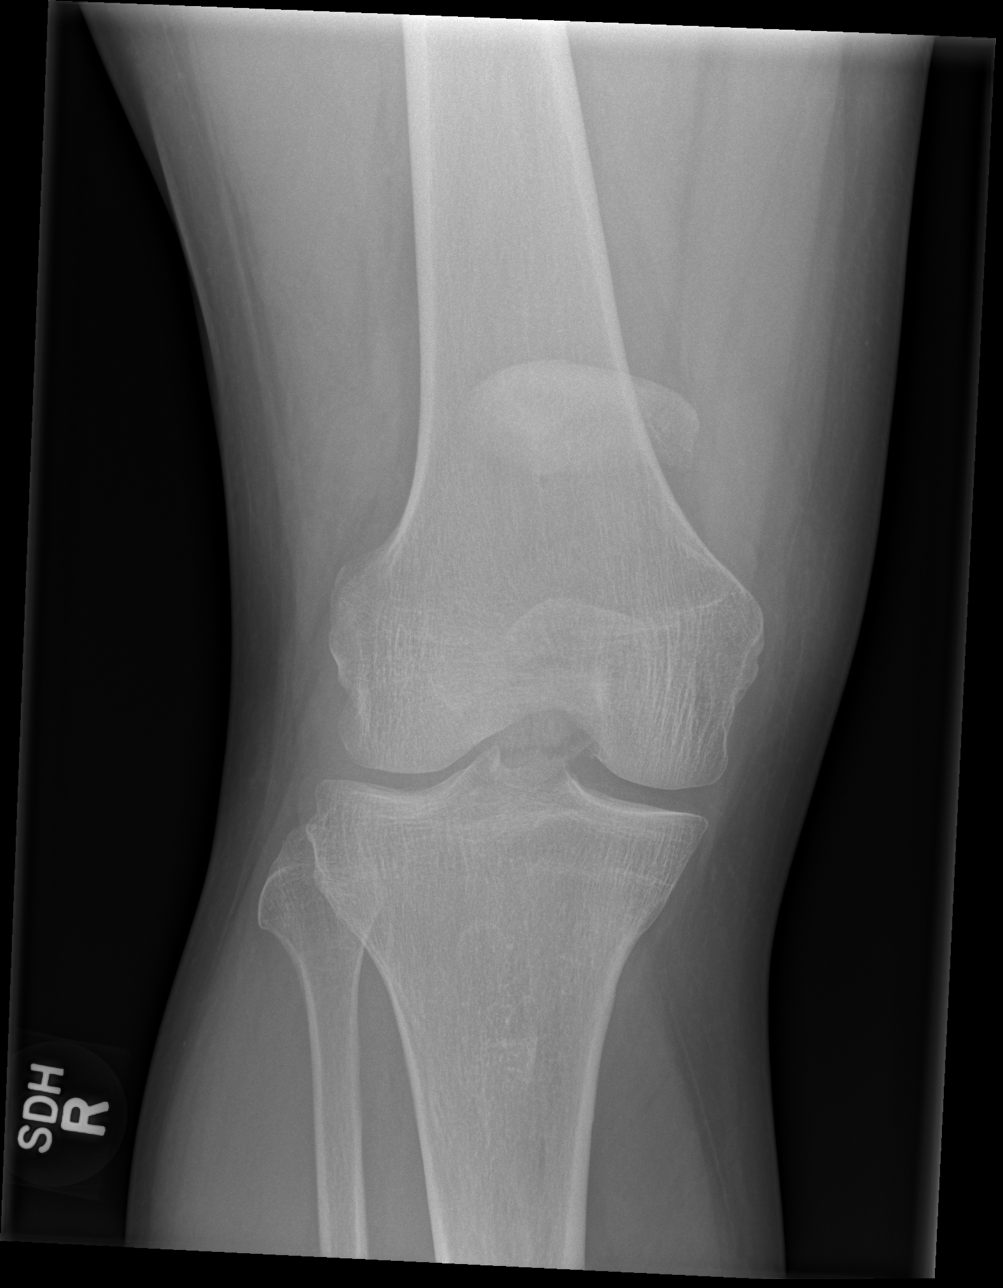

[x knee obl right (1 of 2)]
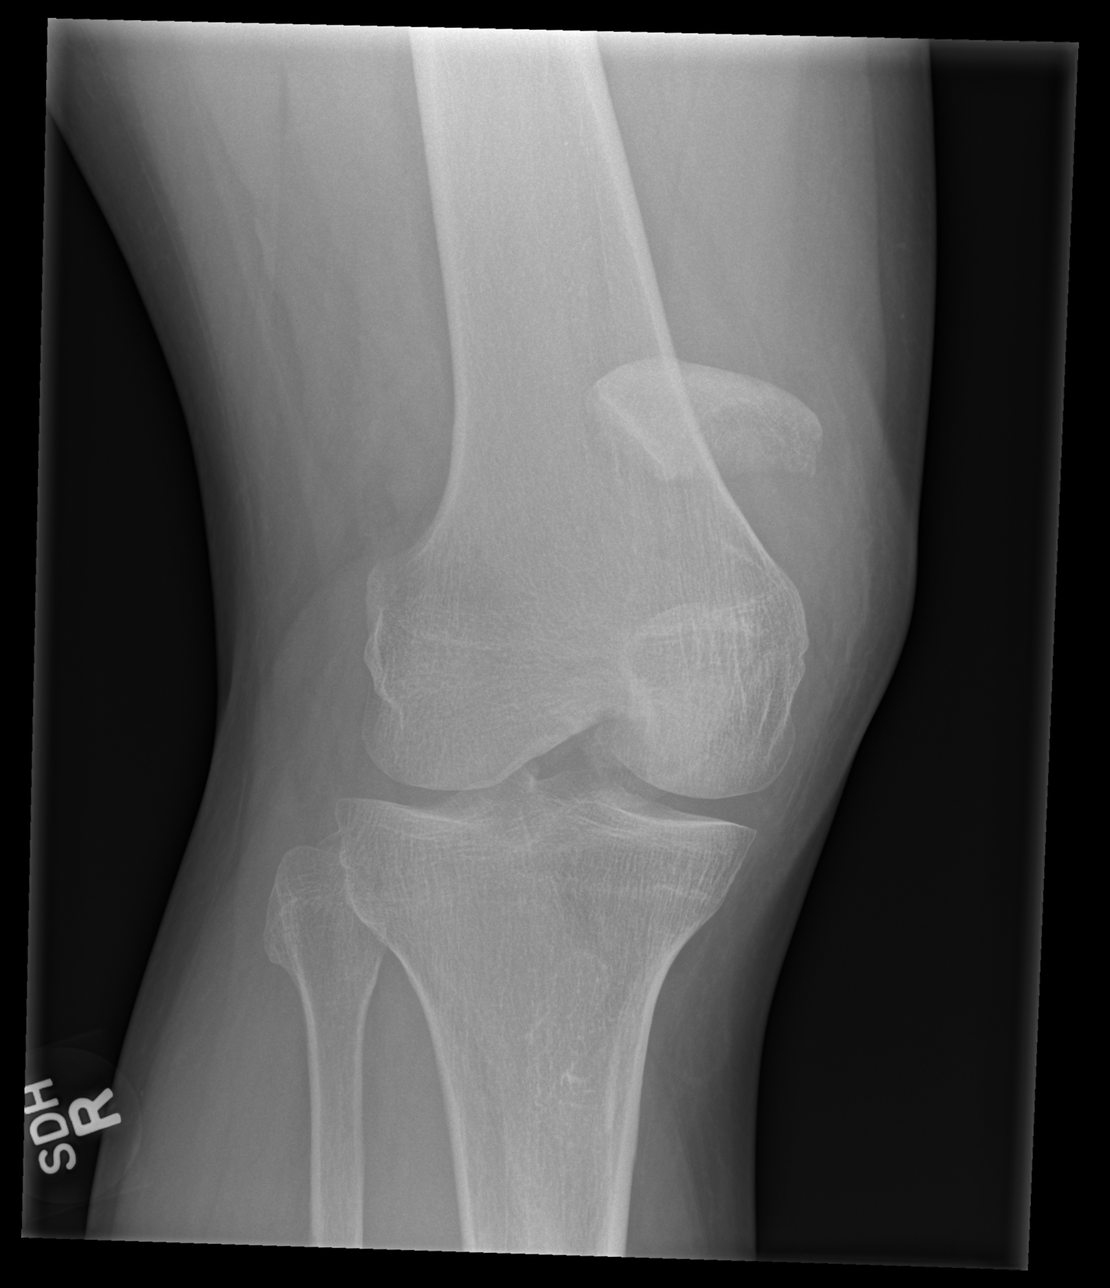

[x knee obl right (2 of 2)]
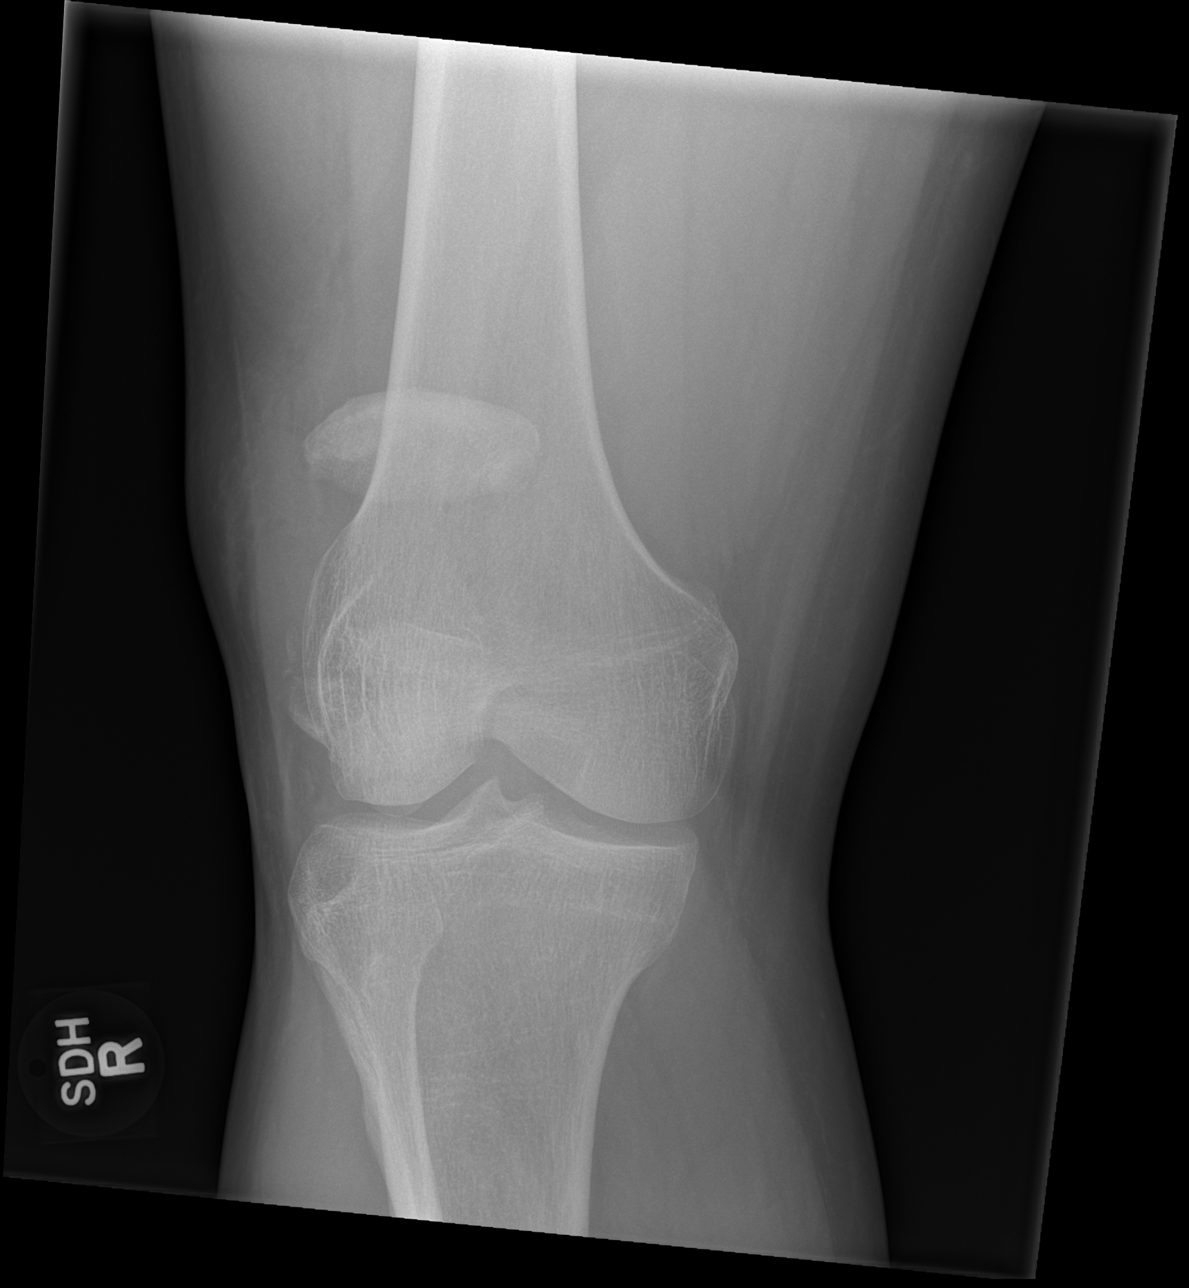

[x knee lat right]
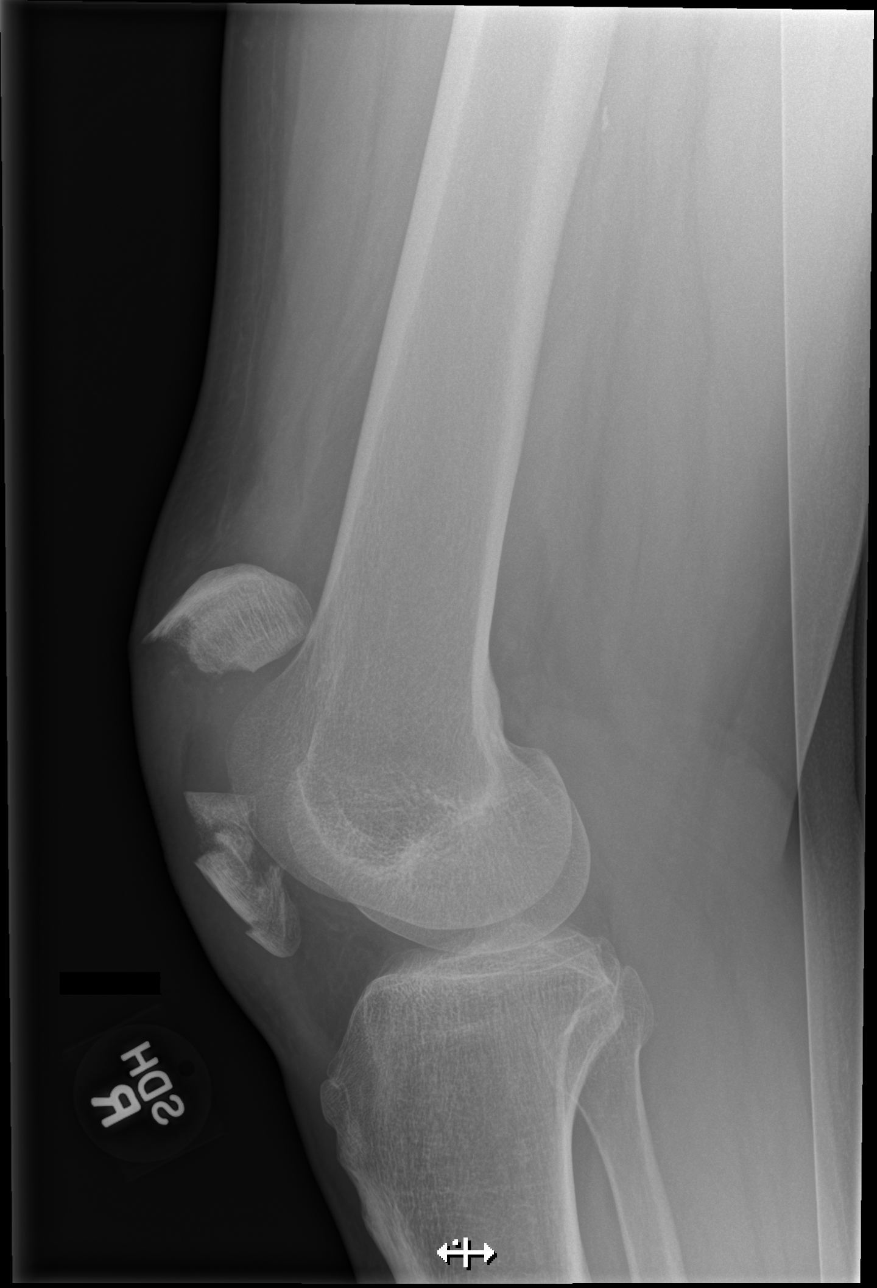

[4 of 4 positions shown; findings below may reference images not displayed]

FINDINGS: Comminuted patellar fracture.  Approximately 4.5 cm of
distraction of the dominant fracture fragments.

Suspected lipohemarthrosis.
IMPRESSION: Comminuted patellar fracture, as described above.

## 2015-11-06 DIAGNOSIS — M62838 Other muscle spasm: Secondary | ICD-10-CM | POA: Diagnosis not present

## 2015-11-06 DIAGNOSIS — M533 Sacrococcygeal disorders, not elsewhere classified: Secondary | ICD-10-CM | POA: Diagnosis not present

## 2015-11-06 DIAGNOSIS — M4806 Spinal stenosis, lumbar region: Secondary | ICD-10-CM | POA: Diagnosis not present

## 2015-11-06 DIAGNOSIS — G8929 Other chronic pain: Secondary | ICD-10-CM | POA: Diagnosis not present

## 2015-11-06 DIAGNOSIS — M5137 Other intervertebral disc degeneration, lumbosacral region: Secondary | ICD-10-CM | POA: Diagnosis not present

## 2015-11-06 DIAGNOSIS — M545 Low back pain: Secondary | ICD-10-CM | POA: Diagnosis not present

## 2015-11-11 DIAGNOSIS — M25561 Pain in right knee: Secondary | ICD-10-CM | POA: Diagnosis not present

## 2015-12-15 DIAGNOSIS — H40051 Ocular hypertension, right eye: Secondary | ICD-10-CM | POA: Diagnosis not present

## 2015-12-15 DIAGNOSIS — H40052 Ocular hypertension, left eye: Secondary | ICD-10-CM | POA: Diagnosis not present

## 2015-12-15 DIAGNOSIS — H47239 Glaucomatous optic atrophy, unspecified eye: Secondary | ICD-10-CM | POA: Diagnosis not present

## 2015-12-15 DIAGNOSIS — H401133 Primary open-angle glaucoma, bilateral, severe stage: Secondary | ICD-10-CM | POA: Diagnosis not present

## 2016-03-08 DIAGNOSIS — H401123 Primary open-angle glaucoma, left eye, severe stage: Secondary | ICD-10-CM | POA: Diagnosis not present

## 2016-03-08 DIAGNOSIS — H401113 Primary open-angle glaucoma, right eye, severe stage: Secondary | ICD-10-CM | POA: Diagnosis not present

## 2016-03-08 DIAGNOSIS — H04123 Dry eye syndrome of bilateral lacrimal glands: Secondary | ICD-10-CM | POA: Diagnosis not present

## 2016-03-08 DIAGNOSIS — H47239 Glaucomatous optic atrophy, unspecified eye: Secondary | ICD-10-CM | POA: Diagnosis not present

## 2016-04-01 DIAGNOSIS — Z23 Encounter for immunization: Secondary | ICD-10-CM | POA: Diagnosis not present

## 2016-05-20 DIAGNOSIS — M545 Low back pain: Secondary | ICD-10-CM | POA: Diagnosis not present

## 2016-05-20 DIAGNOSIS — G8929 Other chronic pain: Secondary | ICD-10-CM | POA: Diagnosis not present

## 2016-05-20 DIAGNOSIS — M48061 Spinal stenosis, lumbar region without neurogenic claudication: Secondary | ICD-10-CM | POA: Diagnosis not present

## 2016-05-20 DIAGNOSIS — M5137 Other intervertebral disc degeneration, lumbosacral region: Secondary | ICD-10-CM | POA: Diagnosis not present

## 2016-08-30 DIAGNOSIS — H401123 Primary open-angle glaucoma, left eye, severe stage: Secondary | ICD-10-CM | POA: Diagnosis not present

## 2016-08-30 DIAGNOSIS — H401113 Primary open-angle glaucoma, right eye, severe stage: Secondary | ICD-10-CM | POA: Diagnosis not present

## 2016-08-30 DIAGNOSIS — H2511 Age-related nuclear cataract, right eye: Secondary | ICD-10-CM | POA: Diagnosis not present

## 2016-08-30 DIAGNOSIS — H2513 Age-related nuclear cataract, bilateral: Secondary | ICD-10-CM | POA: Diagnosis not present

## 2016-08-30 DIAGNOSIS — H25013 Cortical age-related cataract, bilateral: Secondary | ICD-10-CM | POA: Diagnosis not present

## 2016-09-14 DIAGNOSIS — H2511 Age-related nuclear cataract, right eye: Secondary | ICD-10-CM | POA: Diagnosis not present

## 2016-09-14 DIAGNOSIS — H25811 Combined forms of age-related cataract, right eye: Secondary | ICD-10-CM | POA: Diagnosis not present

## 2016-09-14 DIAGNOSIS — H25011 Cortical age-related cataract, right eye: Secondary | ICD-10-CM | POA: Diagnosis not present

## 2016-10-04 DIAGNOSIS — H2512 Age-related nuclear cataract, left eye: Secondary | ICD-10-CM | POA: Diagnosis not present

## 2016-10-04 DIAGNOSIS — H25012 Cortical age-related cataract, left eye: Secondary | ICD-10-CM | POA: Diagnosis not present

## 2016-10-19 DIAGNOSIS — H2512 Age-related nuclear cataract, left eye: Secondary | ICD-10-CM | POA: Diagnosis not present

## 2016-10-19 DIAGNOSIS — H25812 Combined forms of age-related cataract, left eye: Secondary | ICD-10-CM | POA: Diagnosis not present

## 2016-11-02 DIAGNOSIS — M48061 Spinal stenosis, lumbar region without neurogenic claudication: Secondary | ICD-10-CM | POA: Diagnosis not present

## 2016-11-02 DIAGNOSIS — M545 Low back pain: Secondary | ICD-10-CM | POA: Diagnosis not present

## 2016-11-02 DIAGNOSIS — M5137 Other intervertebral disc degeneration, lumbosacral region: Secondary | ICD-10-CM | POA: Diagnosis not present

## 2016-11-02 DIAGNOSIS — G8929 Other chronic pain: Secondary | ICD-10-CM | POA: Diagnosis not present

## 2016-11-02 DIAGNOSIS — M533 Sacrococcygeal disorders, not elsewhere classified: Secondary | ICD-10-CM | POA: Diagnosis not present

## 2017-01-13 DIAGNOSIS — H40043 Steroid responder, bilateral: Secondary | ICD-10-CM | POA: Diagnosis not present

## 2017-01-13 DIAGNOSIS — H40053 Ocular hypertension, bilateral: Secondary | ICD-10-CM | POA: Diagnosis not present

## 2017-01-13 DIAGNOSIS — H401132 Primary open-angle glaucoma, bilateral, moderate stage: Secondary | ICD-10-CM | POA: Diagnosis not present

## 2017-02-14 DIAGNOSIS — H40043 Steroid responder, bilateral: Secondary | ICD-10-CM | POA: Diagnosis not present

## 2017-02-14 DIAGNOSIS — H401132 Primary open-angle glaucoma, bilateral, moderate stage: Secondary | ICD-10-CM | POA: Diagnosis not present

## 2017-02-14 DIAGNOSIS — H40053 Ocular hypertension, bilateral: Secondary | ICD-10-CM | POA: Diagnosis not present

## 2017-02-14 DIAGNOSIS — Z961 Presence of intraocular lens: Secondary | ICD-10-CM | POA: Diagnosis not present

## 2017-04-21 DIAGNOSIS — H401132 Primary open-angle glaucoma, bilateral, moderate stage: Secondary | ICD-10-CM | POA: Diagnosis not present

## 2017-04-21 DIAGNOSIS — H40043 Steroid responder, bilateral: Secondary | ICD-10-CM | POA: Diagnosis not present

## 2017-04-21 DIAGNOSIS — H40053 Ocular hypertension, bilateral: Secondary | ICD-10-CM | POA: Diagnosis not present

## 2017-04-21 DIAGNOSIS — H04123 Dry eye syndrome of bilateral lacrimal glands: Secondary | ICD-10-CM | POA: Diagnosis not present

## 2017-05-23 DIAGNOSIS — H4051X3 Glaucoma secondary to other eye disorders, right eye, severe stage: Secondary | ICD-10-CM | POA: Diagnosis not present

## 2017-05-23 DIAGNOSIS — H4052X2 Glaucoma secondary to other eye disorders, left eye, moderate stage: Secondary | ICD-10-CM | POA: Diagnosis not present

## 2017-05-23 DIAGNOSIS — H4010X4 Unspecified open-angle glaucoma, indeterminate stage: Secondary | ICD-10-CM | POA: Insufficient documentation

## 2017-05-30 DIAGNOSIS — H4052X2 Glaucoma secondary to other eye disorders, left eye, moderate stage: Secondary | ICD-10-CM | POA: Diagnosis not present

## 2017-05-30 DIAGNOSIS — H4051X3 Glaucoma secondary to other eye disorders, right eye, severe stage: Secondary | ICD-10-CM | POA: Diagnosis not present

## 2017-05-30 DIAGNOSIS — M5137 Other intervertebral disc degeneration, lumbosacral region: Secondary | ICD-10-CM | POA: Diagnosis not present

## 2017-05-30 DIAGNOSIS — G8929 Other chronic pain: Secondary | ICD-10-CM | POA: Diagnosis not present

## 2017-05-30 DIAGNOSIS — M545 Low back pain: Secondary | ICD-10-CM | POA: Diagnosis not present

## 2017-06-22 ENCOUNTER — Encounter: Payer: Self-pay | Admitting: Neurology

## 2017-06-23 DIAGNOSIS — M48061 Spinal stenosis, lumbar region without neurogenic claudication: Secondary | ICD-10-CM | POA: Diagnosis not present

## 2017-06-23 DIAGNOSIS — Z91013 Allergy to seafood: Secondary | ICD-10-CM | POA: Diagnosis not present

## 2017-06-23 DIAGNOSIS — E7849 Other hyperlipidemia: Secondary | ICD-10-CM | POA: Diagnosis not present

## 2017-06-23 DIAGNOSIS — K219 Gastro-esophageal reflux disease without esophagitis: Secondary | ICD-10-CM | POA: Diagnosis not present

## 2017-06-23 DIAGNOSIS — H4089 Other specified glaucoma: Secondary | ICD-10-CM | POA: Diagnosis not present

## 2017-06-23 DIAGNOSIS — I129 Hypertensive chronic kidney disease with stage 1 through stage 4 chronic kidney disease, or unspecified chronic kidney disease: Secondary | ICD-10-CM | POA: Diagnosis not present

## 2017-06-23 DIAGNOSIS — G4730 Sleep apnea, unspecified: Secondary | ICD-10-CM | POA: Diagnosis not present

## 2017-06-23 DIAGNOSIS — Z79899 Other long term (current) drug therapy: Secondary | ICD-10-CM | POA: Diagnosis not present

## 2017-06-23 DIAGNOSIS — M5137 Other intervertebral disc degeneration, lumbosacral region: Secondary | ICD-10-CM | POA: Diagnosis not present

## 2017-06-23 DIAGNOSIS — N189 Chronic kidney disease, unspecified: Secondary | ICD-10-CM | POA: Diagnosis not present

## 2017-06-23 DIAGNOSIS — G4733 Obstructive sleep apnea (adult) (pediatric): Secondary | ICD-10-CM | POA: Diagnosis not present

## 2017-06-23 DIAGNOSIS — H4051X3 Glaucoma secondary to other eye disorders, right eye, severe stage: Secondary | ICD-10-CM | POA: Diagnosis not present

## 2017-08-02 ENCOUNTER — Ambulatory Visit (INDEPENDENT_AMBULATORY_CARE_PROVIDER_SITE_OTHER): Payer: Medicare Other | Admitting: Neurology

## 2017-08-02 ENCOUNTER — Encounter: Payer: Self-pay | Admitting: Neurology

## 2017-08-02 VITALS — BP 155/104 | HR 70 | Ht 70.0 in | Wt 302.0 lb

## 2017-08-02 DIAGNOSIS — Z6841 Body Mass Index (BMI) 40.0 and over, adult: Secondary | ICD-10-CM | POA: Diagnosis not present

## 2017-08-02 DIAGNOSIS — G4719 Other hypersomnia: Secondary | ICD-10-CM

## 2017-08-02 DIAGNOSIS — G4733 Obstructive sleep apnea (adult) (pediatric): Secondary | ICD-10-CM

## 2017-08-02 DIAGNOSIS — Z789 Other specified health status: Secondary | ICD-10-CM

## 2017-08-02 NOTE — Progress Notes (Signed)
Subjective:    Patient ID: Tanner Young is a 61 y.o. male.  HPI     Star Age, MD, PhD [facility] [address] [place], [place] [address]  Dear Dr. Rosana Hoes,   I saw your patient, Tanner Young, upon your kind request, in my neurologic clinic today for initial consultation of his sleep disorder, in particular, concern for underlying obstructive sleep apnea. The patient is accompanied by his wife today. As you know, Tanner Young is a 61 year old right-handed gentleman with an underlying medical history of hypertension, reflux disease, chronic low back pain, arthritis, glaucoma, and obesity, who was previously diagnosed with obstructive sleep apnea. Prior sleep study results are not available for my review today. I reviewed your office notes, which you kindly included. He has been on CPAP therapy, but has not been using his machinve very often. A CPAP download was reviewed today from 06/28/2016 through 06/22/2017 which is a total of one year during which time he used his CPAP 4 days, indicating noncompliance, pressure set at 16 cm. Leak was very high with the 95th percentile at 90.9 L/m. Residual AHI at 4.5 per hour. His Epworth sleepiness score is 15 out of 24 today, fatigue score is 44 out of 63. He is married and lives with his wife. They have 5 children. He is a nonsmoker and drinks alcohol in the form of wine occasionally. He drinks caffeine in the form of tea  He has no telltale hx of RLS symptoms. No FHx known of OSA to him. He does not wake up rested and per wife is very restless, has breathing pauses and very loud snoring, which bothers her. He has no night to night nocturia. He does not typically have morning headaches. He has been having allergies and congestion and difficulty breathing through his nose, he has had drainage. He has most of his care through the [place] and has seen an allergy specialist as well. As far as his CPAP, he eventually gave  up on it because it was difficult for him to use it, the mask was leaking. He believes that he had sleep study testing nearly 10 years ago. His current machine is at least 63 years old. He has used a wide Mirage Fx nasal mask.   His Past Medical History Is Significant For: Past Medical History:  Diagnosis Date  . Arthritis   . Chronic back pain   . GERD (gastroesophageal reflux disease)   . Glaucoma, both eyes   . Hypertension   . Neuropathy    rt leg from DDD  . Sleep apnea    uses a cpap  . Wears glasses   . Wears partial dentures    top mid partial-flipper    His Past Surgical History Is Significant For: Past Surgical History:  Procedure Laterality Date  . COLONOSCOPY    . DENTAL SURGERY     extractions  . ORIF PATELLA Right 01/24/2013   Procedure: OPEN REDUCTION INTERNAL (ORIF) FIXATION PATELLA;  Surgeon: Kerin Salen, MD;  Location: [facility];  Service: Orthopedics;  Laterality: Right;  orif right patella,patellectomy     His Family History Is Significant For: No family history on file.  His Social History Is Significant For: Social History   Socioeconomic History  . Marital status: Married    Spouse name: Not on file  . Number of children: Not on file  . Years of education: Not on file  . Highest education  level: Not on file  Social Needs  . Financial resource strain: Not on file  . Food insecurity - worry: Not on file  . Food insecurity - inability: Not on file  . Transportation needs - medical: Not on file  . Transportation needs - non-medical: Not on file  Occupational History  . Not on file  Tobacco Use  . Smoking status: Never Smoker  . Smokeless tobacco: Never Used  Substance and Sexual Activity  . Alcohol use: No  . Drug use: No  . Sexual activity: Not on file  Other Topics Concern  . Not on file  Social History Narrative  . Not on file    His Allergies Are:  Allergies  Allergen Reactions  . Shellfish Allergy Itching and  Nausea And Vomiting    Shrimp   :   His Current Medications Are:  Outpatient Encounter Medications as of 08/02/2017  Medication Sig  . amLODipine (NORVASC) 10 MG tablet Take 10 mg by mouth daily.   [name] aspirin EC 81 MG tablet Take 81 mg by mouth at bedtime.   . benazepril (LOTENSIN) 40 MG tablet Take 20 mg by mouth daily.  . brimonidine (ALPHAGAN) 0.15 % ophthalmic solution Place 1 drop into both eyes 2 (two) times daily at 10 AM and 5 PM.  . cetirizine (ZYRTEC) 10 MG tablet Take 10 mg by mouth daily.  . cholecalciferol (VITAMIN D) 1000 UNITS tablet Take 1,000 Units by mouth daily.  . dorzolamide-timolol (COSOPT) 22.3-6.8 MG/ML ophthalmic solution Place 1 drop into both eyes 2 (two) times daily.   . fluticasone (FLONASE) 50 MCG/ACT nasal spray Place 2 sprays into both nostrils daily.  [name] gabapentin (NEURONTIN) 300 MG capsule Take 300 mg by mouth at bedtime.  [name] ketotifen (REFRESH EYE ITCH RELIEF) 0.025 % ophthalmic solution Place 1 drop into both eyes daily as needed (for eyes).  [name] latanoprost (XALATAN) 0.005 % ophthalmic solution Place 1 drop into both eyes at bedtime.  . meloxicam (MOBIC) 15 MG tablet Take 15 mg by mouth daily.  [name] moxifloxacin (VIGAMOX) 0.5 % ophthalmic solution 1 drop 3 (three) times daily.  [name] omeprazole (PRILOSEC) 20 MG capsule Take 20 mg by mouth at bedtime.  [name] PRESCRIPTION MEDICATION Apply 1 application topically at bedtime. "face wash"  . PRESCRIPTION MEDICATION Apply 1 application topically daily. Shampoo to head  . PRESCRIPTION MEDICATION Apply 1 application topically daily. Cream to feet  . PRESCRIPTION MEDICATION Apply 1 application topically daily. Cream to back  . simvastatin (ZOCOR) 20 MG tablet Take 10 mg by mouth at bedtime.  . triamterene-hydrochlorothiazide (MAXZIDE) 75-50 MG per tablet Take 0.5 tablets by mouth daily.  . Zinc Oxide (DESITIN CREAMY EX) Apply 1 application topically daily as needed (to face and chest).  [name] HYDROcodone-acetaminophen  (NORCO/VICODIN) 5-325 MG per tablet Take 1 tablet by mouth every 6 (six) hours as needed for pain.  . [DISCONTINUED] gabapentin (NEURONTIN) 600 MG tablet Take 600 mg by mouth 3 (three) times daily as needed (for nerve pain).  . [DISCONTINUED] oxyCODONE-acetaminophen (PERCOCET) 5-325 MG per tablet Take 1 tablet by mouth every 6 (six) hours as needed for pain.  . [DISCONTINUED] Rizatriptan Benzoate (MAXALT PO) Take 1 capsule by mouth daily.   No facility-administered encounter medications on file as of 08/02/2017.   : Review of Systems:  Out of a complete 14 point review of systems, all are reviewed and negative with the exception of these symptoms as listed below:  Review of Systems  Neurological:  Pt presents today to discuss his sleep. Pt has been on cpap for at least 5 years. He uses it sporadically. Pt's last sleep study has been greater than 5 years ago. Pt uses [facility].  Epworth Sleepiness Scale 0= would never doze 1= slight chance of dozing 2= moderate chance of dozing 3= high chance of dozing  Sitting and reading: 3 Watching TV: 3 Sitting inactive in a public place (ex. Theater or meeting): 2 As a passenger in a car for an hour without a break: 1 Lying down to rest in the afternoon: 2 Sitting and talking to someone: 1 Sitting quietly after lunch (no alcohol): 3 In a car, while stopped in traffic: 0 Total: 15     Objective:  Neurological Exam  Physical Exam Physical Examination:   Vitals:   08/02/17 0904  BP: (!) 155/104  Pulse: 70    General Examination: The patient is a very pleasant 61 y.o. male in no acute distress. He appears well-developed and well-nourished and well groomed.   HEENT: Normocephalic, atraumatic, pupils are equal, round and reactive to light and accommodation. He wears corrective eyeglasses. He had recent eye surgery on the right with stent/tube placement for glaucoma. with sharp disc margins noted. Extraocular tracking is good without  limitation to gaze excursion or nystagmus noted. Normal smooth pursuit is noted. Hearing is grossly intact. Face is symmetric with normal facial animation and normal facial sensation. Speech is clear with no dysarthria noted. There is no hypophonia. There is no lip, neck/head, jaw or voice tremor. Neck is supple with full range of passive and active motion. There are no carotid bruits on auscultation. Oropharynx exam reveals: moderate mouth dryness, adequate dental hygiene and moderate airway crowding, due tonsils of 2+ bilaterally, uvula is mildly larger. Mallampati is class II. Tongue protrudes centrally and palate elevates symmetrically. Neck circumference is 19-5/8 inches. He has a slight under and skewed bite. Nasal inspection reveals bilateral inferior turbinate hypertrophy, mild erythema, mild mucosal bogginess, no septal deviation.   Chest: Clear to auscultation without wheezing, rhonchi or crackles noted.  Heart: S1+S2+0, regular and normal without murmurs, rubs or gallops noted.   Abdomen: Soft, non-tender and non-distended with normal bowel sounds appreciated on auscultation.  Extremities: There is no pitting edema in the distal lower extremities bilaterally. Pedal pulses are intact.  Skin: Warm and dry without trophic changes noted.  Musculoskeletal: exam reveals no obvious joint deformities, tenderness or joint swelling or erythema, with the exception of hardware in place right knee, right knee slightly enlarged.   Neurologically:  Mental status: The patient is awake, alert and oriented in all 4 spheres. His immediate and remote memory, attention, language skills and fund of knowledge are appropriate. There is no evidence of aphasia, agnosia, apraxia or anomia. Speech is clear with normal prosody and enunciation. Thought process is linear. Mood is normal and affect is normal.  Cranial nerves II - XII are as described above under HEENT exam. In addition: shoulder shrug is normal with  equal shoulder height noted. Motor exam: Normal bulk, strength and tone is noted. There is no drift, tremor or rebound. Romberg is negative. Reflexes are 2+ throughout. Fine motor skills and coordination: intact with normal finger taps, normal hand movements, normal rapid alternating patting, normal foot taps and normal foot agility.  Cerebellar testing: No dysmetria or intention tremor on finger to nose testing. Heel to shin is unremarkable bilaterally. There is no truncal or gait ataxia.  Sensory exam: intact to light touch in  the upper and lower extremities.  Gait, station and balance: He stands easily. No veering to one side is noted. No leaning to one side is noted. Posture is age-appropriate and stance is narrow based. Gait shows normal stride length and normal pace. No problems turning are noted. Tandem walk is Somewhat difficult in the beginning.                Assessment and Plan:  In summary, Tanner Young is a very pleasant 61 y.o.-year old male with an underlying medical history of hypertension, reflux disease, chronic low back pain, arthritis, glaucoma, and obesity, who presents for consultation of his obstructive sleep apnea. He carries a prior diagnosis of this but has not been on treatment with his CPAP. He has an older machine, sleep study testing may of been 5-10 years ago. We did not find records in our system. He is currently not using his CPAP machine. Pressure is also high at 16, leak was very high as well during the 4 days last year he used his machine. He will need reevaluation and may benefit from CPAP therapy versus BiPAP therapy depending on the pressure and severe day of his OSA. He would be willing to get retested with a sleep study.  I had a long chat with the patient and his wife about my findings and the diagnosis of OSA, its prognosis and treatment options. We talked about medical treatments, surgical interventions and non-pharmacological approaches. I explained in  particular the risks and ramifications of untreated moderate to severe OSA, especially with respect to developing cardiovascular disease down the Road, including congestive heart failure, difficult to treat hypertension, cardiac arrhythmias, or stroke. Even type 2 diabetes has, in part, been linked to untreated OSA. Symptoms of untreated OSA include daytime sleepiness, memory problems, mood irritability and mood disorder such as depression and anxiety, lack of energy, as well as recurrent headaches, especially morning headaches. We talked about trying to maintain a healthy lifestyle in general, as well as the importance of weight control. I encouraged the patient to eat healthy, exercise daily and keep well hydrated, to keep a scheduled bedtime and wake time routine, to not skip any meals and eat healthy snacks in between meals. I advised the patient not to drive when feeling sleepy. I recommended the following at this time: sleep study with potential positive airway pressure titration. (We will score hypopneas at 4%).   I explained the sleep test procedure to the patient and also outlined possible surgical and non-surgical treatment options of OSA, including the use of a custom-made dental device (which would require a referral to a specialist dentist or oral surgeon), upper airway surgical options, such as pillar implants, radiofrequency surgery, tongue base surgery, and UPPP (which would involve a referral to an ENT surgeon). Rarely, jaw surgery such as mandibular advancement may be considered.  I also explained the CPAP treatment option to the patient, who indicated that he would be willing to try CPAP again if the need arises. I explained the importance of being compliant with PAP treatment, not only for insurance purposes but primarily to improve His symptoms, and for the patient's long term health benefit, including to reduce Her cardiovascular risks. I answered all their questions today and the patient  and his wife were in agreement. I would like to see him back after the sleep study is completed and encouraged him to call with any interim questions, concerns, problems or updates.   Thank you very much for  allowing me to participate in the care of this nice patient. If I can be of any further assistance to you please do not hesitate to call me at [phone].  Sincerely,   Star Age, MD, PhD

## 2017-08-02 NOTE — Patient Instructions (Signed)
Thank you for choosing [facility] for your sleep related care! It was nice to meet you today! I appreciate that you entrust me with your sleep related healthcare concerns. I hope, I was able to address at least some of your concerns today, and that I can help you feel reassured and also get better.    Here is what we discussed today and what we came up with as our plan for you:    Based on your symptoms and your exam I believe you are still at risk for obstructive sleep apnea or OSA, and I think we should proceed with a sleep study to determine how severe your sleep apnea is and to get you back on track with CPAP. You may qualify for a new machine.  Please remember, the risks and ramifications of moderate to severe obstructive sleep apnea or OSA are: Cardiovascular disease, including congestive heart failure, stroke, difficult to control hypertension, arrhythmias, and even type 2 diabetes has been linked to untreated OSA. Sleep apnea causes disruption of sleep and sleep deprivation in most cases, which, in turn, can cause recurrent headaches, problems with memory, mood, concentration, focus, and vigilance. Most people with untreated sleep apnea report excessive daytime sleepiness, which can affect their ability to drive. Please do not drive if you feel sleepy.   I will likely see you back after your sleep study to go over the test results and where to go from there. We will call you after your sleep study to advise about the results (most likely, you will hear from Garner, my nurse) and to set up an appointment at the time, as necessary.    Our sleep lab administrative assistant, Arrie Aran will call you to schedule your sleep study. If you don't hear back from her by about 2 weeks from now, please feel free to call her at [phone]. You can leave a message with your phone number and concerns, if you get the voicemail box. She will call back as soon as possible.

## 2017-08-17 DIAGNOSIS — H35361 Drusen (degenerative) of macula, right eye: Secondary | ICD-10-CM | POA: Diagnosis not present

## 2017-08-17 DIAGNOSIS — H43811 Vitreous degeneration, right eye: Secondary | ICD-10-CM | POA: Diagnosis not present

## 2017-08-17 DIAGNOSIS — H353131 Nonexudative age-related macular degeneration, bilateral, early dry stage: Secondary | ICD-10-CM | POA: Diagnosis not present

## 2017-08-17 DIAGNOSIS — H35362 Drusen (degenerative) of macula, left eye: Secondary | ICD-10-CM | POA: Diagnosis not present

## 2017-09-15 ENCOUNTER — Ambulatory Visit (INDEPENDENT_AMBULATORY_CARE_PROVIDER_SITE_OTHER): Payer: Medicare Other | Admitting: Neurology

## 2017-09-15 DIAGNOSIS — Z6841 Body Mass Index (BMI) 40.0 and over, adult: Secondary | ICD-10-CM

## 2017-09-15 DIAGNOSIS — G4733 Obstructive sleep apnea (adult) (pediatric): Secondary | ICD-10-CM | POA: Diagnosis not present

## 2017-09-15 DIAGNOSIS — G4720 Circadian rhythm sleep disorder, unspecified type: Secondary | ICD-10-CM

## 2017-09-15 DIAGNOSIS — G4719 Other hypersomnia: Secondary | ICD-10-CM

## 2017-09-15 DIAGNOSIS — Z789 Other specified health status: Secondary | ICD-10-CM

## 2017-09-19 ENCOUNTER — Other Ambulatory Visit: Payer: Self-pay | Admitting: Neurology

## 2017-09-19 DIAGNOSIS — Z6841 Body Mass Index (BMI) 40.0 and over, adult: Secondary | ICD-10-CM

## 2017-09-19 DIAGNOSIS — G4719 Other hypersomnia: Secondary | ICD-10-CM

## 2017-09-19 DIAGNOSIS — G4733 Obstructive sleep apnea (adult) (pediatric): Secondary | ICD-10-CM

## 2017-09-19 NOTE — Progress Notes (Signed)
Patient referred by his dentist, Dr. Everlene Farrier, seen by me on 08/02/17, split night sleep study on 09/15/17. Please call and notify patient that the recent sleep study confirmed the diagnosis of severe OSA. He did fairly well with CPAP during the study with improvement of the respiratory events. But an optimal pressure was not determined. Therefore, I would like to bring patient back for a full night titration study with CPAP or BiPAP. In the interim, I would like to start him on CPAP therapy at home by prescribing a machine for home use at a pressure of 18 cm. I placed the order in the chart.  Please advise patient that we need a follow up appointment with either myself or one of our nurse practitioners in about 10 weeks post set-up to check for how the patient is feeling and how well the patient is using the machine, etc. Please go ahead and schedule the appointment, while you have the patient on the phone and make sure patient understands the importance of keeping this window for the FU appointment, as it is often an insurance requirement. Failing to adhere to this may result in losing coverage for sleep apnea treatment, at which point most patients are left with a choice of returning the machine or paying out of pocket (and we want neither of this to happen!).  Please re-enforce the importance of compliance with treatment and the need for [name] to monitor compliance data - again an insurance requirement and usually a good feedback for the patient as far as how they are doing.  Also remind patient, that any PAP machine or mask issues should be first addressed with the DME company, who provided the machine/mask.  Please ask if patient has a preference regarding DME company, may depend on the insurance too.  Please arrange for CPAP set up at home through a DME company of patient's choice.  Once you have spoken to the patient you can close the phone encounter. Please fax/route report to referring provider, thanks,    Star Age, MD, PhD [facility])

## 2017-09-19 NOTE — Procedures (Signed)
PATIENT'S NAME:  Tanner Young, Tanner Young DOB:      06/08/1956      MR#:    [id number]     DATE OF RECORDING: 09/15/2017 REFERRING M.D.:  Ranelle Oyster, DMD Study Performed:  Split-Night Titration Study HISTORY: 62 year old man with a history of hypertension, reflux disease, chronic low back pain, arthritis, glaucoma, and obesity, who was previously diagnosed with obstructive sleep apnea. He has not been using his CPAP. The patient endorsed the Epworth Sleepiness Scale at 15 points. The patient's weight 302 pounds with a height of 70 (inches), resulting in a BMI of 43.2 kg/m2. The patient's neck circumference measured 19.5 inches.  CURRENT MEDICATIONS: Norvasc, Aspirin, Lotensin, Alphagan, Zyrtec, Vitamin D, Cosopt, Flonase, Neurontin, Ketotifen, Xalatan, Mobic, Vigomax, Prilosec, Maxzide, Zinc oxide cream, Vicodin  PROCEDURE:  This is a multichannel digital polysomnogram utilizing the Somnostar 11.2 system.  Electrodes and sensors were applied and monitored per AASM Specifications.   EEG, EOG, Chin and Limb EMG, were sampled at 200 Hz.  ECG, Snore and Nasal Pressure, Thermal Airflow, Respiratory Effort, CPAP Flow and Pressure, Oximetry was sampled at 50 Hz. Digital video and audio were recorded.      BASELINE STUDY WITHOUT CPAP RESULTS:  Lights Out was at 22:14 and Lights On at 05:13, split start at 00:20, epoch 262. Total recording time (TRT) was 130.5, with a total sleep time (TST) of 120 minutes.   The patient's sleep latency was 4 minutes.  REM sleep was absent. The sleep efficiency was 92. %.    SLEEP ARCHITECTURE: WASO (Wake after sleep onset) was 5 minutes, Stage N1 was 4.5 minutes, Stage N2 was 115.5 minutes, Stage N3 was 0 minutes and Stage R (REM sleep) was 0 minutes.  The percentages were Stage N1 3.8%, Stage N2 96.3%, which is increased, Stage N3 and Stage R (REM sleep) were absent.  The arousals were noted as: 8 were spontaneous, 0 were associated with PLMs, 87 were associated with respiratory  events.  Audio and video analysis did not show any abnormal or unusual movements, behaviors, phonations or vocalizations. The patient took 1 bathroom break for the night. Moderate to loud snoring was noted. The EKG was in keeping with normal sinus rhythm (NSR).   RESPIRATORY ANALYSIS:  There were a total of 140 respiratory events:  113 obstructive apneas, 0 central apneas and 5 mixed apneas with a total of 118 apneas and an apnea index (AI) of 59. There were 22 hypopneas with a hypopnea index of 11. The patient also had 0 respiratory event related arousals (RERAs).  Snoring was noted.     The total APNEA/HYPOPNEA INDEX (AHI) was 70/hour and the total RESPIRATORY DISTURBANCE INDEX was 70 /hour.  0 events occurred in REM sleep and 49 events in NREM. The REM AHI was n/a versus a non-REM AHI of 70 /hour. The patient spent 353 minutes sleep time in the supine position 18 minutes in non-supine. The supine AHI was 69.1 /hour versus a non-supine AHI of 77.2 /hour.  OXYGEN SATURATION & C02:  The wake baseline 02 saturation was 96%, with the lowest being 73%. Time spent below 89% saturation equaled 108 minutes.  PERIODIC LIMB MOVEMENTS: The patient had a total of 0 Periodic Limb Movements.  The Periodic Limb Movement (PLM) index was 0 /hour and the PLM Arousal index was 0 /hour.  TITRATION STUDY WITH CPAP RESULTS:   The patient was fitted with a medium Simplus FFM, CPAP was initiated at 5 cmH20 with heated humidity per AASM split  night standards and pressure was advanced to 18 cmH20 because of hypopneas, apneas and desaturations. He did not achieve any sleep on 18 cm. At a PAP pressure of 16 cmH20, the AHI was 45.9/hour and O2 nadir was 85% with supine REM sleep achieved.    Total recording time (TRT) was 289 minutes, with a total sleep time (TST) of 250.5 minutes. The patient's sleep latency was 24 minutes. REM latency was 32 minutes.  The sleep efficiency was 86.7 %.    SLEEP ARCHITECTURE: Wake after  sleep was 31 minutes, Stage N1 14.5 minutes, Stage N2 137.5 minutes, Stage N3 29 minutes and Stage R (REM sleep) 69.5 minutes. The percentages were: Stage N1 5.8%, Stage N2 54.9%, Stage N3 11.6% and Stage R (REM sleep) 27.7%.   The arousals were noted as: 25 were spontaneous, 0 were associated with PLMs, 31 were associated with respiratory events.  RESPIRATORY ANALYSIS:  There were a total of 88 respiratory events: 2 obstructive apneas, 25 central apneas and 2 mixed apneas with a total of 29 apneas and an apnea index (AI) of 6.9. There were 59 hypopneas with a hypopnea index of 14.1 /hour. The patient also had 0 respiratory event related arousals (RERAs).      The total APNEA/HYPOPNEA INDEX  (AHI) was 21.1 /hour and the total RESPIRATORY DISTURBANCE INDEX was 21.1 /hour.  31 events occurred in REM sleep and 57 events in NREM. The REM AHI was 26.8 /hour versus a non-REM AHI of 18.9 /hour. REM sleep was achieved on a pressure of  cm/h2o (AHI was  .) The patient spent 99% of total sleep time in the supine position. The supine AHI was 20.6 /hour, versus a non-supine AHI of 51.4/hour.  OXYGEN SATURATION & C02:  The wake baseline 02 saturation was 94%, with the lowest being 78%. Time spent below 89% saturation equaled 21 minutes.  PERIODIC LIMB MOVEMENTS: The patient had a total of 0 Periodic Limb Movements. The Periodic Limb Movement (PLM) index was 0 /hour and the PLM Arousal index was 0 /hour.  Post-study, the patient indicated that sleep was better than usual.  POLYSOMNOGRAPHY IMPRESSION :   1. Obstructive Sleep Apnea (OSA)  2. Dysfunctions associated with sleep stages or arousals from sleep  RECOMMENDATIONS:  1. This patient has severe obstructive sleep apnea and responded to CPAP therapy. However, the titration portion of the study was not fully successful. I will recommend a full night CPAP/BiPAP titration study to optimize treatment. In the interim, I will start the patient on home CPAP  therapy at a pressure of 18 cm via medium FFM, with heated humidity. The patient should be reminded to be fully compliant with PAP therapy to improve sleep related symptoms and decrease long term cardiovascular risks. Please note that untreated obstructive sleep apnea carries additional perioperative morbidity. Patients with significant obstructive sleep apnea should receive perioperative PAP therapy and the surgeons and particularly the anesthesiologist should be informed of the diagnosis and the severity of the sleep disordered breathing. 2. The use of narcotic pain medication should be minimized or eliminated, if possible. Aggressive weight loss should be promoted.  3. This study shows sleep fragmentation and abnormal sleep stage percentages; these are nonspecific findings and per se do not signify an intrinsic sleep disorder or a cause for the patient's sleep-related symptoms. Causes include (but are not limited to) the first night effect of the sleep study, circadian rhythm disturbances, medication effect or an underlying mood disorder or medical problem.  4. The  patient should be cautioned not to drive, work at heights, or operate dangerous or heavy equipment when tired or sleepy. Review and reiteration of good sleep hygiene measures should be pursued with any patient. 5. The patient will be seen in follow-up by Dr. Rexene Alberts at [facility] for discussion of the test results and further management strategies. The referring provider will be notified of the test results.  I certify that I have reviewed the entire raw data recording prior to the issuance of this report in accordance with the Standards of Accreditation of the American Academy of Sleep Medicine (AASM)   Star Age, MD, PhD Diplomat, American Board of Psychiatry and Neurology (Neurology and Sleep Medicine)

## 2017-09-19 NOTE — Addendum Note (Signed)
Addended by: Star Age on: 09/19/2017 08:03 AM   Modules accepted: Orders

## 2017-09-20 ENCOUNTER — Telehealth: Payer: Self-pay

## 2017-09-20 NOTE — Telephone Encounter (Signed)
-----   Message from Star Age, MD sent at 09/19/2017  8:01 AM EST ----- Patient referred by his dentist, Dr. Everlene Farrier, seen by me on 08/02/17, split night sleep study on 09/15/17. Please call and notify patient that the recent sleep study confirmed the diagnosis of severe OSA. He did fairly well with CPAP during the study with improvement of the respiratory events. But an optimal pressure was not determined. Therefore, I would like to bring patient back for a full night titration study with CPAP or BiPAP. In the interim, I would like to start him on CPAP therapy at home by prescribing a machine for home use at a pressure of 18 cm. I placed the order in the chart.  Please advise patient that we need a follow up appointment with either myself or one of our nurse practitioners in about 10 weeks post set-up to check for how the patient is feeling and how well the patient is using the machine, etc. Please go ahead and schedule the appointment, while you have the patient on the phone and make sure patient understands the importance of keeping this window for the FU appointment, as it is often an insurance requirement. Failing to adhere to this may result in losing coverage for sleep apnea treatment, at which point most patients are left with a choice of returning the machine or paying out of pocket (and we want neither of this to happen!).  Please re-enforce the importance of compliance with treatment and the need for [name] to monitor compliance data - again an insurance requirement and usually a good feedback for the patient as far as how they are doing.  Also remind patient, that any PAP machine or mask issues should be first addressed with the DME company, who provided the machine/mask.  Please ask if patient has a preference regarding DME company, may depend on the insurance too.  Please arrange for CPAP set up at home through a DME company of patient's choice.  Once you have spoken to the patient you can close the  phone encounter. Please fax/route report to referring provider, thanks,   Star Age, MD, PhD [facility])

## 2017-09-20 NOTE — Telephone Encounter (Signed)
I called pt to discuss. No answer, left a message asking him to call me back. 

## 2017-09-20 NOTE — Telephone Encounter (Signed)
I called pt. I advised pt that Dr. Rexene Alberts reviewed their sleep study results and found that pt has severe osa, did fairly well with the cpap during his study, but an optimal pressure was not found. Dr. Rexene Alberts recommends that pt start a cpap at home and also return for a cpap or bipap titration as well. I reviewed PAP compliance expectations with the pt. Pt is agreeable to starting a CPAP. I advised pt that an order will be sent to a DME, [facility], and [facility] will call the pt within about one week after they file with the pt's insurance. [facility] will show the pt how to use the machine, fit for masks, and troubleshoot the CPAP if needed. A follow up appt was made for insurance purposes with Dr. Rexene Alberts on 11/21/17 at 1:00pm. Pt verbalized understanding to arrive 15 minutes early and bring their CPAP. A letter with all of this information in it will be mailed to the pt as a reminder. I verified with the pt that the address we have on file is correct. Pt verbalized understanding of results. Pt had no questions at this time but was encouraged to call back if questions arise.

## 2017-10-18 ENCOUNTER — Ambulatory Visit (INDEPENDENT_AMBULATORY_CARE_PROVIDER_SITE_OTHER): Payer: Medicare Other | Admitting: Neurology

## 2017-10-18 DIAGNOSIS — G4719 Other hypersomnia: Secondary | ICD-10-CM

## 2017-10-18 DIAGNOSIS — G4733 Obstructive sleep apnea (adult) (pediatric): Secondary | ICD-10-CM | POA: Diagnosis not present

## 2017-10-18 DIAGNOSIS — Z6841 Body Mass Index (BMI) 40.0 and over, adult: Secondary | ICD-10-CM

## 2017-10-18 DIAGNOSIS — G4720 Circadian rhythm sleep disorder, unspecified type: Secondary | ICD-10-CM

## 2017-10-24 ENCOUNTER — Other Ambulatory Visit: Payer: Self-pay | Admitting: Neurology

## 2017-10-24 DIAGNOSIS — G4733 Obstructive sleep apnea (adult) (pediatric): Secondary | ICD-10-CM

## 2017-10-24 NOTE — Procedures (Signed)
PATIENT'S NAME:  Tanner Young, Tanner Young DOB:      1956-06-08      MR#:    [id number]     DATE OF RECORDING: 10/18/2017 REFERRING M.D.:  Ranelle Oyster, DMD Study Performed:   CPAP  Titration HISTORY: 62 year old man with a history of hypertension, reflux disease, chronic low back pain, arthritis, glaucoma, and obesity, who returns for a PAP titration study as his attempted split night study on 09/15/17 was not fully successful. His baseline AHI was 70/hour and O2 nadir was 73%. The patient endorsed the Epworth Sleepiness Scale at 15/24 points. The patient's weight 302 pounds with a height of 70 (inches), resulting in a BMI of 43.2 kg/m2. The patient's neck circumference measured 19 inches.  CURRENT MEDICATIONS: Norvasc, Aspirin, Lotensin, Alphagan, Zyrtec, Vitamin D, Cosopt, Flonase, Neurontin, Ketotifen, Xalatan, Mobic, Vigomax, Prilosec, Maxzide, Zinc oxide cream, Vicodin.   PROCEDURE:  This is a multichannel digital polysomnogram utilizing the SomnoStar 11.2 system.  Electrodes and sensors were applied and monitored per AASM Specifications.   EEG, EOG, Chin and Limb EMG, were sampled at 200 Hz.  ECG, Snore and Nasal Pressure, Thermal Airflow, Respiratory Effort, CPAP Flow and Pressure, Oximetry was sampled at 50 Hz. Digital video and audio were recorded.      The patient used a medium F30 FFM. CPAP was initiated at 8 cmH20 with heated humidity per AASM standards and pressure was advanced to 16 cmH20 because of hypopneas, apneas and desaturations.  At a PAP pressure of 16 cmH20, there was a reduction of the AHI to 0/hour with supine REM sleep achieved and O2 nadir of 90%.   Lights Out was at 21:57 and Lights On at 05:18. Total recording time (TRT) was 433.5 minutes, with a total sleep time (TST) of 392 minutes. The patient's sleep latency was 7.5 minutes with 1.5 minutes of wake time after sleep onset. REM latency was 46.5 minutes. The sleep efficiency was 90.4 %.    SLEEP ARCHITECTURE: WASO (Wake after sleep  onset) was 41.5 minutes with overall mild sleep fragmentation noted and one longer period of wakefulness. There were 16.5 minutes in Stage N1, 277.5 minutes Stage N2, 0 minutes Stage N3 and 100 minutes in Stage REM.  The percentage of Stage N1 was 4.2%, Stage N2 was 70.4%, which is increased, Stage N3 was absent, and Stage R (REM sleep) was 25.4%, which is high normal. The arousals were noted as: 20 were spontaneous, 0 were associated with PLMs, 48 were associated with respiratory events.  Audio and video analysis did not show any abnormal or unusual movements, behaviors, phonations or vocalizations. The patient took no bathroom breaks. The EKG was in keeping with normal sinus rhythm (NSR).  RESPIRATORY ANALYSIS:  There was a total of 45 respiratory events: 0 obstructive apneas, 0 central apneas and 0 mixed apneas with a total of 0 apneas and an apnea index (AI) of 0 /hour. There were 45 hypopneas with a hypopnea index of 6.9/hour. The patient also had 0 respiratory event related arousals (RERAs).      The total APNEA/HYPOPNEA INDEX  (AHI) was 6.9 /hour and the total RESPIRATORY DISTURBANCE INDEX was 6.9 .hour  24 events occurred in REM sleep and 21 events in NREM. The REM AHI was 14.4 /hour versus a non-REM AHI of 4.3 /hour.  The patient spent 394 minutes of total sleep time in the supine position and 0 minutes in non-supine. The supine AHI was 6.9, versus a non-supine AHI of 0.0.  OXYGEN SATURATION & C02:  The baseline 02 saturation was 0%, with the lowest being 78%. Time spent below 89% saturation equaled 51 minutes.  PERIODIC LIMB MOVEMENTS: The patient had a total of 6 Periodic Limb Movements. The Periodic Limb Movement (PLM) index was .9 and the PLM Arousal index was 0 /hour.  Post-study, the patient indicated that sleep was better than usual.   POLYSOMNOGRAPHY IMPRESSION :  1. Obstructive Sleep Apnea (OSA)  2. Dysfunctions associated with sleep stages or arousals from sleep    RECOMMENDATIONS: 1. This study demonstrates resolution of the patient's obstructive sleep apnea with CPAP therapy. I will, therefore, start the patient on home CPAP treatment at a pressure of 16 cm via medium FFM with heated humidity. The patient should be reminded to be fully compliant with PAP therapy to improve sleep related symptoms and decrease long term cardiovascular risks. The patient should be reminded, that it may take up to 3 months to get fully used to using PAP with all planned sleep. The earlier full compliance is achieved, the better long term compliance tends to be. Please note that untreated obstructive sleep apnea carries additional perioperative morbidity. Patients with significant obstructive sleep apnea should receive perioperative PAP therapy and the surgeons and particularly the anesthesiologist should be informed of the diagnosis and the severity of the sleep disordered breathing. 2. The use of narcotic pain medication should be minimized or eliminated, if possible. Aggressive weight loss should be promoted.  3. This study shows sleep fragmentation and abnormal sleep stage percentages; these are nonspecific findings and per se do not signify an intrinsic sleep disorder or a cause for the patient's sleep-related symptoms. Causes include (but are not limited to) the first night effect of the sleep study, circadian rhythm disturbances, medication effect or an underlying mood disorder or medical problem.  4. The patient should be cautioned not to drive, work at heights, or operate dangerous or heavy equipment when tired or sleepy. Review and reiteration of good sleep hygiene measures should be pursued with any patient. 5. The patient will be seen in follow-up by Dr. Rexene Alberts at [facility] for discussion of the test results and further management strategies. The referring provider will be notified of the test results.   I certify that I have reviewed the entire raw data recording prior to the  issuance of this report in accordance with the Standards of Accreditation of the American Academy of Sleep Medicine (AASM)    Star Age, MD, PhD Diplomat, American Board of Psychiatry and Neurology (Neurology and Sleep Medicine)

## 2017-10-24 NOTE — Progress Notes (Signed)
Patient referred by his dentist, Dr. Everlene Farrier, seen by me on 08/02/17, attempted split night sleep study on 09/15/17. Patient had a CPAP titration study on 10/18/17.  Please call and inform patient that I have entered an order for treatment with positive airway pressure (PAP) treatment for obstructive sleep apnea (OSA). He did well during the latest sleep study with CPAP. We will, therefore have him use CPAP at 16 cm at home. he may have a DME co by now. I will see the patient back in follow-up in about 10 weeks. Please also explain to the patient that I will be looking out for compliance data, which can be downloaded from the machine (stored on an SD card, that is inserted in the machine) or via remote access through a modem, that is built into the machine. At the time of the followup appointment we will discuss sleep study results and how it is going with PAP treatment at home. Please advise patient to bring His machine at the time of the first FU visit, even though this is cumbersome. Bringing the machine for every visit after that will likely not be needed, but often helps for the first visit to troubleshoot if needed. Please re-enforce the importance of compliance with treatment and the need for [name] to monitor compliance data - often an insurance requirement and actually good feedback for the patient as far as how they are doing.  Also remind patient, that any interim PAP machine or mask issues should be first addressed with the DME company, as they can often help better with technical and mask fit issues. Please ask if patient has a preference regarding DME company.  Please also make sure, the patient has a follow-up appointment with me in about 10 weeks from the setup date, thanks. May see one of our nurse practitioners if needed for proper timing of the FU appointment.  Please fax or rout report to the referring provider. Thanks,   Star Age, MD, PhD [facility])

## 2017-10-25 ENCOUNTER — Telehealth: Payer: Self-pay

## 2017-10-25 NOTE — Telephone Encounter (Signed)
I called Tanner Young. I advised Tanner Young that Dr. Rexene Alberts reviewed their sleep study results and found that Tanner Young Tanner Young did well with the cpap during his latest sleep study. Dr. Rexene Alberts recommends that Tanner Young start a cpap at 16 cm H2O at home. I reviewed PAP compliance expectations with the Tanner Young. Tanner Young is agreeable to starting a CPAP. I advised Tanner Young that an order will be sent to a DME, [facility], and [facility] will call the Tanner Young within about one week after they file with the Tanner Young's insurance. [facility] will show the Tanner Young how to use the machine, fit for masks, and troubleshoot the CPAP if needed. A follow up appt was made for insurance purposes with Dr. Rexene Alberts on 11/21/17 at 1:00pm. Tanner Young verbalized understanding to arrive 15 minutes early and bring their CPAP. A letter with all of this information in it will be mailed to the Tanner Young as a reminder. I verified with the Tanner Young that the address we have on file is correct. Tanner Young verbalized understanding of results. Tanner Young had no questions at this time but was encouraged to call back if questions arise.

## 2017-10-25 NOTE — Telephone Encounter (Signed)
-----   Message from Star Age, MD sent at 10/24/2017 12:07 PM EDT ----- Patient referred by his dentist, Dr. Everlene Farrier, seen by me on 08/02/17, attempted split night sleep study on 09/15/17. Patient had a CPAP titration study on 10/18/17.  Please call and inform patient that I have entered an order for treatment with positive airway pressure (PAP) treatment for obstructive sleep apnea (OSA). He did well during the latest sleep study with CPAP. We will, therefore have him use CPAP at 16 cm at home. he may have a DME co by now. I will see the patient back in follow-up in about 10 weeks. Please also explain to the patient that I will be looking out for compliance data, which can be downloaded from the machine (stored on an SD card, that is inserted in the machine) or via remote access through a modem, that is built into the machine. At the time of the followup appointment we will discuss sleep study results and how it is going with PAP treatment at home. Please advise patient to bring His machine at the time of the first FU visit, even though this is cumbersome. Bringing the machine for every visit after that will likely not be needed, but often helps for the first visit to troubleshoot if needed. Please re-enforce the importance of compliance with treatment and the need for [name] to monitor compliance data - often an insurance requirement and actually good feedback for the patient as far as how they are doing.  Also remind patient, that any interim PAP machine or mask issues should be first addressed with the DME company, as they can often help better with technical and mask fit issues. Please ask if patient has a preference regarding DME company.  Please also make sure, the patient has a follow-up appointment with me in about 10 weeks from the setup date, thanks. May see one of our nurse practitioners if needed for proper timing of the FU appointment.  Please fax or rout report to the referring provider. Thanks,    Star Age, MD, PhD [facility])

## 2017-11-17 ENCOUNTER — Encounter: Payer: Self-pay | Admitting: Neurology

## 2017-11-21 ENCOUNTER — Encounter: Payer: Self-pay | Admitting: Neurology

## 2017-11-21 ENCOUNTER — Ambulatory Visit (INDEPENDENT_AMBULATORY_CARE_PROVIDER_SITE_OTHER): Payer: Medicare Other | Admitting: Neurology

## 2017-11-21 VITALS — BP 149/85 | HR 67 | Ht 70.0 in | Wt 315.0 lb

## 2017-11-21 DIAGNOSIS — G4733 Obstructive sleep apnea (adult) (pediatric): Secondary | ICD-10-CM

## 2017-11-21 DIAGNOSIS — H4051X3 Glaucoma secondary to other eye disorders, right eye, severe stage: Secondary | ICD-10-CM | POA: Diagnosis not present

## 2017-11-21 DIAGNOSIS — Z9989 Dependence on other enabling machines and devices: Secondary | ICD-10-CM | POA: Diagnosis not present

## 2017-11-21 DIAGNOSIS — H4052X2 Glaucoma secondary to other eye disorders, left eye, moderate stage: Secondary | ICD-10-CM | POA: Diagnosis not present

## 2017-11-21 NOTE — Progress Notes (Signed)
Subjective:    Patient ID: Tanner Young is a 62 y.o. male.  HPI     Interim history:   Mr. Weide is a 62 year old right-handed gentleman with an underlying medical history of hypertension, reflux disease, chronic low back pain, arthritis, glaucoma, and obesity, who presents for follow-up consultation of his obstructive sleep apnea, after recent sleep study testing and starting CPAP therapy. The patient is unaccompanied today. I first met him on 08/02/2017 at the request of his dentist, at which time he reported a prior diagnosis of obstructive sleep apnea but he does not using CPAP. He was invited for sleep study testing. He had a split-night sleep study, followed by a full night titration study. I went over his test results with him in detail today. Split-night sleep study from 09/15/2017 showed a baseline sleep efficiency of 92%, sleep latency was 4 minutes, REM sleep was absent. He had a elevated AHI of 70 per hour, average oxygen saturation of 96%, nadir was 73%. He was then fitted with a medium fullface mask. CPAP was started at 5 cm and advanced to 18 cm. He did not achieve any sleep on the final pressure, on a treatment pressure of 16 cm his AHI was highly elevated. Since he did not have a fully successful titration portion of the study, he was advised to return for a full night CPAP titration test separately. He had this on 10/18/2017, sleep efficiency was 90.4%, sleep latency 7.5 minutes and REM latency was 46.5 minutes. CPAP was initiated at 8 cm and titrated to 16 cm. On the final titration pressure his AHI was 0 per hour, supine REM sleep was achieved and with a nadir was 90%. He had no slow-wave sleep during the study but REM sleep was 25.4%. He had no significant PLMS. Based on his test results he was advised to start home CPAP therapy at a pressure of 16 cm. We cautioned him regarding ongoing use of narcotic pain medication and the importance of losing weight to reduce the severity of  his sleep-disordered breathing.   Today, 11/21/2017: I reviewed his CPAP compliance data from 10/19/2017 to 11/17/2017 which is a total of 30 days, during which time he used his CPAP 30 days with percent used days greater than 4 hours at 100%, indicating superb compliance with an average usage of 7 hours and 10 minutes, residual AHI at goal at 3.4 per hour, leak acceptable with the 95th percentile at 11.7 L/m on a pressure of 16 cm with EPR of 3. He reports doing better, and feeling improved, less sleepy during the day, sleeps quietly. Still has some Norco, but only as needed, not daily. Able to tolerate the pressure in the mask. Wife is very pleased with how well he is sleeping and how quiet the machine is.  The patient's allergies, current medications, family history, past medical history, past social history, past surgical history and problem list were reviewed and updated as appropriate.   Previously:   08/02/2017: (He) was previously diagnosed with obstructive sleep apnea. Prior sleep study results are not available for my review today. I reviewed your office notes, which you kindly included. He has been on CPAP therapy, but has not been using his machine very often. A CPAP download was reviewed today from 06/28/2016 through 06/22/2017 which is a total of one year during which time he used his CPAP 4 days, indicating noncompliance, pressure set at 16 cm. Leak was very high with the 95th percentile at 90.9 L/m.  Residual AHI at 4.5 per hour. His Epworth sleepiness score is 15 out of 24 today, fatigue score is 44 out of 63. He is married and lives with his wife. They have 5 children. He is a nonsmoker and drinks alcohol in the form of wine occasionally. He drinks caffeine in the form of tea  He has no telltale hx of RLS symptoms. No FHx known of OSA to him. He does not wake up rested and per wife is very restless, has breathing pauses and very loud snoring, which bothers her. He has no night to night  nocturia. He does not typically have morning headaches. He has been having allergies and congestion and difficulty breathing through his nose, he has had drainage. He has most of his care through the [place] and has seen an allergy specialist as well. As far as his CPAP, he eventually gave up on it because it was difficult for him to use it, the mask was leaking. He believes that he had sleep study testing nearly 10 years ago. His current machine is at least 64 years old. He has used a wide Mirage Fx nasal mask.    His Past Medical History Is Significant For: Past Medical History:  Diagnosis Date  . Arthritis   . Chronic back pain   . GERD (gastroesophageal reflux disease)   . Glaucoma, both eyes   . Hypertension   . Neuropathy    rt leg from DDD  . Sleep apnea    uses a cpap  . Wears glasses   . Wears partial dentures    top mid partial-flipper    His Past Surgical History Is Significant For: Past Surgical History:  Procedure Laterality Date  . COLONOSCOPY    . DENTAL SURGERY     extractions  . ORIF PATELLA Right 01/24/2013   Procedure: OPEN REDUCTION INTERNAL (ORIF) FIXATION PATELLA;  Surgeon: Kerin Salen, MD;  Location: [facility];  Service: Orthopedics;  Laterality: Right;  orif right patella,patellectomy     His Family History Is Significant For: No family history on file.  His Social History Is Significant For: Social History   Socioeconomic History  . Marital status: Married    Spouse name: Not on file  . Number of children: Not on file  . Years of education: Not on file  . Highest education level: Not on file  Occupational History  . Not on file  Social Needs  . Financial resource strain: Not on file  . Food insecurity:    Worry: Not on file    Inability: Not on file  . Transportation needs:    Medical: Not on file    Non-medical: Not on file  Tobacco Use  . Smoking status: Never Smoker  . Smokeless tobacco: Never Used  Substance and Sexual  Activity  . Alcohol use: No  . Drug use: No  . Sexual activity: Not on file  Lifestyle  . Physical activity:    Days per week: Not on file    Minutes per session: Not on file  . Stress: Not on file  Relationships  . Social connections:    Talks on phone: Not on file    Gets together: Not on file    Attends religious service: Not on file    Active member of club or organization: Not on file    Attends meetings of clubs or organizations: Not on file    Relationship status: Not on file  Other Topics Concern  .  Not on file  Social History Narrative  . Not on file    His Allergies Are:  Allergies  Allergen Reactions  . Shellfish Allergy Itching and Nausea And Vomiting    Shrimp   :   His Current Medications Are:  Outpatient Encounter Medications as of 11/21/2017  Medication Sig  . amLODipine (NORVASC) 10 MG tablet Take 10 mg by mouth daily.   . brimonidine (ALPHAGAN) 0.15 % ophthalmic solution Place 1 drop into both eyes 2 (two) times daily at 10 AM and 5 PM.  . cetirizine (ZYRTEC) 10 MG tablet Take 10 mg by mouth daily.  . cholecalciferol (VITAMIN D) 1000 UNITS tablet Take 1,000 Units by mouth daily.  . dorzolamide-timolol (COSOPT) 22.3-6.8 MG/ML ophthalmic solution Place 1 drop into both eyes 2 (two) times daily.   [name] Fexofenadine HCl (MUCINEX ALLERGY PO) Take by mouth.  . fluticasone (FLONASE) 50 MCG/ACT nasal spray Place 2 sprays into both nostrils daily.  [name] gabapentin (NEURONTIN) 300 MG capsule Take 300 mg by mouth at bedtime.  [name] guaifenesin (HUMIBID E) 400 MG TABS tablet Take 800 mg by mouth 2 (two) times daily.  [name] HYDROcodone-acetaminophen (NORCO/VICODIN) 5-325 MG per tablet Take 1 tablet by mouth every 6 (six) hours as needed for pain.  [name] ketotifen (REFRESH EYE ITCH RELIEF) 0.025 % ophthalmic solution Place 1 drop into both eyes daily as needed (for eyes).  Donnie Coffin POTASSIUM PO Take by mouth.  . meloxicam (MOBIC) 15 MG tablet Take 15 mg by mouth daily.  [name]  moxifloxacin (VIGAMOX) 0.5 % ophthalmic solution 1 drop 3 (three) times daily.  [name] omeprazole (PRILOSEC) 20 MG capsule Take 20 mg by mouth at bedtime.  [name] PRESCRIPTION MEDICATION Apply 1 application topically at bedtime. "face wash"  . PRESCRIPTION MEDICATION Apply 1 application topically daily. Shampoo to head  . PRESCRIPTION MEDICATION Apply 1 application topically daily. Cream to feet  . PRESCRIPTION MEDICATION Apply 1 application topically daily. Cream to back  . simvastatin (ZOCOR) 20 MG tablet Take 10 mg by mouth at bedtime.  . triamterene-hydrochlorothiazide (MAXZIDE) 75-50 MG per tablet Take 0.5 tablets by mouth daily.  . Zinc Oxide (DESITIN CREAMY EX) Apply 1 application topically daily as needed (to face and chest).  . [DISCONTINUED] aspirin EC 81 MG tablet Take 81 mg by mouth at bedtime.   . [DISCONTINUED] benazepril (LOTENSIN) 40 MG tablet Take 20 mg by mouth daily.  . [DISCONTINUED] latanoprost (XALATAN) 0.005 % ophthalmic solution Place 1 drop into both eyes at bedtime.   No facility-administered encounter medications on file as of 11/21/2017.   :  Review of Systems:  Out of a complete 14 point review of systems, all are reviewed and negative with the exception of these symptoms as listed below: Review of Systems  Neurological:       Pt presents today to discuss his cpap. Pt and wife report that pt is doing much better.    Objective:  Neurological Exam  Physical Exam Physical Examination:   Vitals:   11/21/17 1308  BP: (!) 149/85  Pulse: 67    General Examination: The patient is a very pleasant 62 y.o. male in no acute distress. He appears well-developed and well-nourished and well groomed. Good spirits.   HEENT: Normocephalic, atraumatic, pupils are equal, round and reactive to light and accommodation. He wears corrective eyeglasses. He had recent eye surgery on the right with stent/tube placement for glaucoma. Extraocular tracking is good without limitation to gaze  excursion or nystagmus noted. Normal  smooth pursuit is noted. Hearing is grossly intact. Face is symmetric with normal facial animation and normal facial sensation. Speech is clear with no dysarthria noted. There is no hypophonia. There is no lip, neck/head, jaw or voice tremor. Neck is supple with full range of passive and active motion. There are no carotid bruits on auscultation. Oropharynx exam reveals: no significant mouth dryness, adequate dental hygiene and moderate airway crowding. Mallampati is class II. Tongue protrudes centrally and palate elevates symmetrically.   Chest: Clear to auscultation without wheezing, rhonchi or crackles noted.  Heart: S1+S2+0, regular and normal without murmurs, rubs or gallops noted.   Abdomen: Soft, non-tender and non-distended with normal bowel sounds appreciated on auscultation.  Extremities: There is no obvious change.   Skin: Warm and dry without trophic changes noted.  Musculoskeletal: exam reveals no obvious joint deformities, tenderness or joint swelling or erythema, with the exception of hardware in place right knee, no obvious change.    Neurologically:  Mental status: The patient is awake, alert and oriented in all 4 spheres. His immediate and remote memory, attention, language skills and fund of knowledge are appropriate. There is no evidence of aphasia, agnosia, apraxia or anomia. Speech is clear with normal prosody and enunciation. Thought process is linear. Mood is normal and affect is normal.  Cranial nerves II - XII are as described above under HEENT exam.  Motor exam: Normal bulk, strength and tone is noted. There is no drift, tremor or rebound. Romberg is negative. Reflexes are 2+ throughout. Fine motor skills and coordination: intact with normal finger taps, normal hand movements, normal rapid alternating patting, normal foot taps and normal foot agility.  Cerebellar testing: No dysmetria or intention tremor. There is no truncal or  gait ataxia.  Sensory exam: intact to light touch in the upper and lower extremities.  Gait, station and balance: He stands easily. No veering to one side is noted. No leaning to one side is noted. Posture is age-appropriate and stance is narrow based. Gait shows normal stride length and normal pace. No problems turning are noted.           Assessment and Plan:  In summary, STEAVEN WHOLEY is a very pleasant 62 year old male with an underlying medical history of hypertension, reflux disease, chronic low back pain, arthritis, glaucoma, and obesity, who presents for follow-up consultation of his severe obstructive sleep apnea. He had difficulty with his prior CPAP. He had a split-night sleep study since then in January 2019 and a subsequent full night titration study in March 2019. He is fully compliant with his CPAP and commended for his superb treatment adherence. He also indicates good results with respect to sleep consolidation, sleep quality and daytime somnolence, his nocturia is also better. His wife is very pleased with how he is doing and observes that he sleeps much quieter, his machine is very quiet as well. I again explained the risks and ramifications of untreated moderate to severe OSA, especially with respect to developing cardiovascular disease down the Road, including congestive heart failure, difficult to treat hypertension, cardiac arrhythmias, or stroke. Even type 2 diabetes has, in part, been linked to untreated OSA. Symptoms of untreated OSA include daytime sleepiness, memory problems, mood irritability and mood disorder such as depression and anxiety, lack of energy, as well as recurrent headaches, especially morning headaches. We talked about trying to maintain a healthy lifestyle in general, as well as the importance of weight control. I encouraged the patient to Continue to  work on weight loss. I again explained the importance of being compliant with PAP treatment, not only for  insurance purposes but primarily to improve His symptoms, and for the patient's long term health benefit, including to reduce Her cardiovascular risks.I suggested a 6 month follow-up, he can see one of our nurse practitioners that time, hopefully we can see him yearly thereafter as long as he continues to do well. I answered all their questions today and the patient and his wife were in agreement. I spent 25 minutes in total face-to-face time with the patient, more than 50% of which was spent in counseling and coordination of care, reviewing test results, reviewing medication and discussing or reviewing the diagnosis of OSA, its prognosis and treatment options. Pertinent laboratory and imaging test results that were available during this visit with the patient were reviewed by me and considered in my medical decision making (see chart for details).

## 2017-11-21 NOTE — Patient Instructions (Signed)
Please continue using your CPAP regularly. While your insurance requires that you use CPAP at least 4 hours each night on 70% of the nights, I recommend, that you not skip any nights and use it throughout the night if you can. Getting used to CPAP and staying with the treatment long term does take time and patience and discipline. Untreated obstructive sleep apnea when it is moderate to severe can have an adverse impact on cardiovascular health and raise her risk for heart disease, arrhythmias, hypertension, congestive heart failure, stroke and diabetes. Untreated obstructive sleep apnea causes sleep disruption, nonrestorative sleep, and sleep deprivation. This can have an impact on your day to day functioning and cause daytime sleepiness and impairment of cognitive function, memory loss, mood disturbance, and problems focussing. Using CPAP regularly can improve these symptoms.  Keep up the good work! We can see you in 6 months and hopefully yearly after that. You can see one of our nurse practitioners next time, as you are stable. I will see you after that.

## 2018-01-02 DIAGNOSIS — M48061 Spinal stenosis, lumbar region without neurogenic claudication: Secondary | ICD-10-CM | POA: Diagnosis not present

## 2018-01-20 ENCOUNTER — Ambulatory Visit (INDEPENDENT_AMBULATORY_CARE_PROVIDER_SITE_OTHER): Payer: Medicare Other | Admitting: Family Medicine

## 2018-01-20 ENCOUNTER — Encounter: Payer: Self-pay | Admitting: Family Medicine

## 2018-01-20 VITALS — BP 140/90 | HR 69 | Ht 70.0 in | Wt 318.2 lb

## 2018-01-20 DIAGNOSIS — I10 Essential (primary) hypertension: Secondary | ICD-10-CM | POA: Insufficient documentation

## 2018-01-20 DIAGNOSIS — M1991 Primary osteoarthritis, unspecified site: Secondary | ICD-10-CM | POA: Diagnosis not present

## 2018-01-20 DIAGNOSIS — Z9989 Dependence on other enabling machines and devices: Secondary | ICD-10-CM

## 2018-01-20 DIAGNOSIS — N189 Chronic kidney disease, unspecified: Secondary | ICD-10-CM

## 2018-01-20 DIAGNOSIS — Z125 Encounter for screening for malignant neoplasm of prostate: Secondary | ICD-10-CM

## 2018-01-20 DIAGNOSIS — R3911 Hesitancy of micturition: Secondary | ICD-10-CM | POA: Insufficient documentation

## 2018-01-20 DIAGNOSIS — G4733 Obstructive sleep apnea (adult) (pediatric): Secondary | ICD-10-CM

## 2018-01-20 DIAGNOSIS — M1990 Unspecified osteoarthritis, unspecified site: Secondary | ICD-10-CM | POA: Insufficient documentation

## 2018-01-20 DIAGNOSIS — M7989 Other specified soft tissue disorders: Secondary | ICD-10-CM

## 2018-01-20 LAB — PSA: PSA: 1.74 ng/mL (ref 0.10–4.00)

## 2018-01-20 LAB — COMPREHENSIVE METABOLIC PANEL
ALBUMIN: 4 g/dL (ref 3.5–5.2)
ALT: 24 U/L (ref 0–53)
AST: 21 U/L (ref 0–37)
Alkaline Phosphatase: 61 U/L (ref 39–117)
BILIRUBIN TOTAL: 0.7 mg/dL (ref 0.2–1.2)
BUN: 14 mg/dL (ref 6–23)
CO2: 25 mEq/L (ref 19–32)
Calcium: 9.5 mg/dL (ref 8.4–10.5)
Chloride: 103 mEq/L (ref 96–112)
Creatinine, Ser: 1.18 mg/dL (ref 0.40–1.50)
GFR: 80.06 mL/min (ref >60.00)
Glucose, Bld: 218 mg/dL — ABNORMAL HIGH (ref 70–99)
POTASSIUM: 4 meq/L (ref 3.5–5.1)
Sodium: 137 mEq/L (ref 135–145)
TOTAL PROTEIN: 6.7 g/dL (ref 6.0–8.3)

## 2018-01-20 LAB — CBC
HCT: 37.9 % — ABNORMAL LOW (ref 39.0–52.0)
Hemoglobin: 13 g/dL (ref 13.0–17.0)
MCHC: 34.4 g/dL (ref 30.0–36.0)
MCV: 88.7 fl (ref 78.0–100.0)
Platelets: 188 10*3/uL (ref 150.0–400.0)
RBC: 4.28 Mil/uL (ref 4.22–5.81)
RDW: 13.6 % (ref 11.5–15.5)
WBC: 5.2 10*3/uL (ref 4.0–10.5)

## 2018-01-20 LAB — TSH: TSH: 1.75 u[IU]/mL (ref 0.35–4.50)

## 2018-01-20 NOTE — Patient Instructions (Signed)
It was very nice to meet you today! Continue current medications We'll call you with lab results once they return You will receive a call to arrange for kidney ultrasound.    Edema Edema is when you have too much fluid in your body or under your skin. Edema may make your legs, feet, and ankles swell up. Swelling is also common in looser tissues, like around your eyes. This is a common condition. It gets more common as you get older. There are many possible causes of edema. Eating too much salt (sodium) and being on your feet or sitting for a long time can cause edema in your legs, feet, and ankles. Hot weather may make edema worse. Edema is usually painless. Your skin may look swollen or shiny. Follow these instructions at home:  Keep the swollen body part raised (elevated) above the level of your heart when you are sitting or lying down.  Do not sit still or stand for a long time.  Do not wear tight clothes. Do not wear garters on your upper legs.  Exercise your legs. This can help the swelling go down.  Wear elastic bandages or support stockings as told by your doctor.  Eat a low-salt (low-sodium) diet to reduce fluid as told by your doctor.  Depending on the cause of your swelling, you may need to limit how much fluid you drink (fluid restriction).  Take over-the-counter and prescription medicines only as told by your doctor. Contact a doctor if:  Treatment is not working.  You have heart, liver, or kidney disease and have symptoms of edema.  You have sudden and unexplained weight gain. Get help right away if:  You have shortness of breath or chest pain.  You cannot breathe when you lie down.  You have pain, redness, or warmth in the swollen areas.  You have heart, liver, or kidney disease and get edema all of a sudden.  You have a fever and your symptoms get worse all of a sudden. Summary  Edema is when you have too much fluid in your body or under your  skin.  Edema may make your legs, feet, and ankles swell up. Swelling is also common in looser tissues, like around your eyes.  Raise (elevate) the swollen body part above the level of your heart when you are sitting or lying down.  Follow your doctor's instructions about diet and how much fluid you can drink (fluid restriction). This information is not intended to replace advice given to you by your health care provider. Make sure you discuss any questions you have with your health care provider. Document Released: 01/19/2008 Document Revised: 08/20/2016 Document Reviewed: 08/20/2016 Elsevier Interactive Patient Education  2017 [employer].

## 2018-01-20 NOTE — Assessment & Plan Note (Signed)
Update renal function Renal US ordered.

## 2018-01-20 NOTE — Assessment & Plan Note (Signed)
Reports compliance with CPAP, followed by [facility].

## 2018-01-20 NOTE — Assessment & Plan Note (Signed)
Given prolonged nature I think DVT is unlikley This is probably from venous insufficiency but I have seen some instances where lymphatic or venous obstruction from prostate cancer/renal abnormalities have caused unilateral swelling.  Update PSA and Renal US ordered.

## 2018-01-20 NOTE — Assessment & Plan Note (Signed)
Would like to restart meloxicam Recheck renal function initially, will restart if appropriate.

## 2018-01-20 NOTE — Progress Notes (Signed)
Tanner Young - 62 y.o. male MRN [id number]  Date of birth: [birth year]  Subjective Chief Complaint  Patient presents with  . Leg Swelling  . Hypertension    HPI Tanner Young is a 62 y.o. male with a history of HTN, OA, CKD, GERD, glaucoma, HLD and OSA here today to establish with new PCP.  He participates in dual care with a PCP through the [place] as well.  He receives the majority of his medications through the [place] pharmacy.  His main concern today is swelling of the L leg.  He tells me that this began about 1 year ago and has been off and on with worsening if on his feet for long periods of time or if it is hot outside.  R leg does swell some but not as much as L leg.  He denies pain associated with this, sob, cough, chest pain/tightness, palpitations.    -HTN:  BP medications managed by PCP through [place].  Reports compliance with current medications and no signficant side effects including symptoms of hypotension.  He denies anginal symptoms, headaches or vision changes.   -CKD:  Previously followed by nephrology, however nephrologist retired.  Unsure of baseline kidney function.  Reports having biopsies a couple of times, unsure if he has had ultrasound previously.  Denies symptoms of urinary obstruction, changes in urinary output, or hematuria.    -OSA:  Followed and managed by [facility] neurology.  Reports compliance with CPAP use.    -OA:  OA of knees and hands.  S/p ORIF of patella of R leg.  Previous tx with meloxicam which worked well, still has a few at home that he uses occasionally.  Would like a full rx again.    ROS:  ROS completed and negative except as noted per HPI Allergies  Allergen Reactions  . Shellfish Allergy Itching and Nausea And Vomiting    Shrimp     Past Medical History:  Diagnosis Date  . Arthritis   . Chronic back pain   . Chronic kidney disease   . GERD (gastroesophageal reflux disease)   . Glaucoma, both eyes   . Hypertension   . Neuropathy    rt leg  from DDD  . Sleep apnea    uses a cpap  . Wears glasses   . Wears partial dentures    top mid partial-flipper    Past Surgical History:  Procedure Laterality Date  . COLONOSCOPY    . DENTAL SURGERY     extractions  . ORIF PATELLA Right 01/24/2013   Procedure: OPEN REDUCTION INTERNAL (ORIF) FIXATION PATELLA;  Surgeon: Kerin Salen, MD;  Location: [facility];  Service: Orthopedics;  Laterality: Right;  orif right patella,patellectomy     Social History   Socioeconomic History  . Marital status: Married    Spouse name: Not on file  . Number of children: Not on file  . Years of education: Not on file  . Highest education level: Not on file  Occupational History  . Not on file  Social Needs  . Financial resource strain: Not on file  . Food insecurity:    Worry: Not on file    Inability: Not on file  . Transportation needs:    Medical: Not on file    Non-medical: Not on file  Tobacco Use  . Smoking status: Never Smoker  . Smokeless tobacco: Never Used  Substance and Sexual Activity  . Alcohol use: No  . Drug use: No  .  Sexual activity: Not on file  Lifestyle  . Physical activity:    Days per week: Not on file    Minutes per session: Not on file  . Stress: Not on file  Relationships  . Social connections:    Talks on phone: Not on file    Gets together: Not on file    Attends religious service: Not on file    Active member of club or organization: Not on file    Attends meetings of clubs or organizations: Not on file    Relationship status: Not on file  Other Topics Concern  . Not on file  Social History Narrative  . Not on file    Family History  Problem Relation Age of Onset  . Arthritis Mother   . Hearing loss Mother   . Hyperlipidemia Mother   . Hypertension Mother   . Cancer Father   . Diabetes Brother   . Hyperlipidemia Brother   . Hypertension Brother     Health Maintenance  Topic Date Due  . Hepatitis C Screening  [birth year], 1955    . TETANUS/TDAP  08/06/1973  . COLONOSCOPY  08/06/2004  . HIV Screening  01/21/2019 (Originally 08/06/1969)  . INFLUENZA VACCINE  03/16/2018    ----------------------------------------------------------------------------------------------------------------------------------------------------------------------------------------------------------------- Physical Exam BP 140/90   Pulse 69   Ht 5\' 10"  (1.778 m)   Wt (!) 318 lb 3.2 oz (144.3 kg)   BMI 45.66 kg/m   Physical Exam  Constitutional: He is oriented to person, place, and time. He appears well-nourished. No distress.  HENT:  Head: Normocephalic and atraumatic.  Mouth/Throat: Oropharynx is clear and moist.  Eyes: No scleral icterus.  Neck: Neck supple. No thyromegaly present.  Cardiovascular: Normal rate, regular rhythm and normal heart sounds.  Pulmonary/Chest: Effort normal and breath sounds normal.  Abdominal: Soft. He exhibits no distension.  Musculoskeletal: Normal range of motion. He exhibits edema (Trace RLE, 1+ LLE).  Neurological: He is alert and oriented to person, place, and time.  Skin: Skin is warm and dry.  Psychiatric: He has a normal mood and affect. His behavior is normal.    ------------------------------------------------------------------------------------------------------------------------------------------------------------------------------------------------------------------- Assessment and Plan  OSA on CPAP Reports compliance with CPAP, followed by [facility].   Essential hypertension BP is a little high Follow low salt diet Continue current medications Update renal function  Chronic kidney disease Update renal function Renal US ordered.   Localized swelling of lower extremity Given prolonged nature I think DVT is unlikley This is probably from venous insufficiency but I have seen some instances where lymphatic or venous obstruction from prostate cancer/renal abnormalities have caused unilateral  swelling.  Update PSA and Renal US ordered.    Osteoarthritis Would like to restart meloxicam Recheck renal function initially, will restart if appropriate.

## 2018-01-20 NOTE — Assessment & Plan Note (Signed)
BP is a little high Follow low salt diet Continue current medications Update renal function

## 2018-01-23 ENCOUNTER — Ambulatory Visit (HOSPITAL_BASED_OUTPATIENT_CLINIC_OR_DEPARTMENT_OTHER)
Admission: RE | Admit: 2018-01-23 | Discharge: 2018-01-23 | Disposition: A | Discharge status: Home / Self Care | Payer: Medicare Other | Source: Ambulatory Visit | Attending: Family Medicine | Admitting: Family Medicine

## 2018-01-23 ENCOUNTER — Encounter (HOSPITAL_BASED_OUTPATIENT_CLINIC_OR_DEPARTMENT_OTHER): Payer: Self-pay

## 2018-01-23 DIAGNOSIS — N189 Chronic kidney disease, unspecified: Secondary | ICD-10-CM | POA: Diagnosis not present

## 2018-01-27 ENCOUNTER — Other Ambulatory Visit (INDEPENDENT_AMBULATORY_CARE_PROVIDER_SITE_OTHER): Payer: Medicare Other

## 2018-01-27 ENCOUNTER — Other Ambulatory Visit: Payer: Self-pay | Admitting: Family Medicine

## 2018-01-27 DIAGNOSIS — R739 Hyperglycemia, unspecified: Secondary | ICD-10-CM

## 2018-01-27 LAB — HEMOGLOBIN A1C: Hgb A1c MFr Bld: 7.6 % — ABNORMAL HIGH (ref 4.6–6.5)

## 2018-01-27 NOTE — Progress Notes (Signed)
Kidney ultrasound is normal Normal renal function Glucose is high and A1c returned showing that he has diabetes.  Please have him follow up with me to discuss treatment for this.

## 2018-01-30 ENCOUNTER — Ambulatory Visit (INDEPENDENT_AMBULATORY_CARE_PROVIDER_SITE_OTHER): Payer: Medicare Other | Admitting: Family Medicine

## 2018-01-30 ENCOUNTER — Encounter: Payer: Self-pay | Admitting: Family Medicine

## 2018-01-30 VITALS — BP 146/86 | HR 64 | Temp 98.9°F | Ht 70.0 in | Wt 308.8 lb

## 2018-01-30 DIAGNOSIS — E119 Type 2 diabetes mellitus without complications: Secondary | ICD-10-CM

## 2018-01-30 LAB — MICROALBUMIN / CREATININE URINE RATIO
CREATININE, U: 189.2 mg/dL
MICROALB/CREAT RATIO: 145.8 mg/g — AB (ref 0.0–30.0)
Microalb, Ur: 276 mg/dL — ABNORMAL HIGH (ref 0.0–1.9)

## 2018-01-30 MED ORDER — METFORMIN HCL ER 500 MG PO TB24: 500.0000 mg | ORAL_TABLET | Freq: Every day | ORAL | 1 refills | Status: DC

## 2018-01-30 MED ORDER — BLOOD GLUCOSE MONITOR KIT: PACK | 0 refills | Status: AC

## 2018-01-30 NOTE — Patient Instructions (Signed)
Type 2 Diabetes Mellitus, Diagnosis, Adult Type 2 diabetes (type 2 diabetes mellitus) is a long-term (chronic) disease. It may be caused by one or both of these problems:  Your body does not make enough of a hormone called insulin.  Your body does not react in a normal way to insulin that it makes.  Insulin lets sugars (glucose) go into cells in the body. This gives you energy. If you have type 2 diabetes, sugars cannot get into cells. This causes high blood sugar (hyperglycemia). Your doctor will set treatment goals for you. Generally, you should have these blood sugar levels:  Before meals (preprandial): 80-130 mg/dL (4.4-7.2 mmol/L).  After meals (postprandial): below 180 mg/dL (10 mmol/L).  A1c (hemoglobin A1c) level: less than 7%.  Follow these instructions at home: Questions to Ask Your Doctor  You may want to ask these questions:  Do I need to meet with a diabetes educator?  Where can I find a support group for people with diabetes?  What equipment will I need to care for myself at home?  What diabetes medicines do I need? When should I take them?  How often do I need to check my blood sugar?  What number can I call if I have questions?  When is my next doctor's visit?  General instructions  Take over-the-counter and prescription medicines only as told by your doctor.  Keep all follow-up visits as told by your doctor. This is important. Contact a doctor if:  Your blood sugar is at or above 240 mg/dL (13.3 mmol/L) for 2 days in a row.  You have been sick or have had a fever for 2 days or more and you are not getting better.  You have any of these problems for more than 6 hours: ? You cannot eat or drink. ? You feel sick to your stomach (nauseous). ? You throw up (vomit). ? You have watery poop (diarrhea). Get help right away if:  Your blood sugar is lower than 54 mg/dL (3 mmol/L).  You get confused.  You have trouble: ? Thinking  clearly. ? Breathing.  You have moderate or large ketone levels in your pee (urine). This information is not intended to replace advice given to you by your health care provider. Make sure you discuss any questions you have with your health care provider. Document Released: 05/11/2008 Document Revised: 01/08/2016 Document Reviewed: 09/05/2015 Elsevier Interactive Patient Education  2018 Elsevier Inc.   

## 2018-01-30 NOTE — Progress Notes (Signed)
Tanner Young - 62 y.o. male MRN [id number]  Date of birth: 02-Jul-1956  Subjective Chief Complaint  Patient presents with  . Follow-up    follow up A1C result. tdap? colonoscopy scheduled 09/19?    HPI Tanner Young is a 62 y.o. male here today for follow up of elevated A1c.  Seen for initial appt recently and had routine labs completed with A1c of 7.6%.  No prior history of diabetes and has not had elevated blood sugars in the past however does have family history of DM.  He has not had any symptoms including polyuria or polydipsia, neuropathic symptoms or increased vision changes.  Has had proteinuria previously with kidney biopsy x2 but recent renal function and [name] were normal.    ROS:  ROS completed and negative except as noted per HPI.  Allergies  Allergen Reactions  . Shellfish Allergy Itching and Nausea And Vomiting    Shrimp     Past Medical History:  Diagnosis Date  . Arthritis   . Chronic back pain   . Chronic kidney disease   . GERD (gastroesophageal reflux disease)   . Glaucoma, both eyes   . Hypertension   . Neuropathy    rt leg from DDD  . Sleep apnea    uses a cpap  . Wears glasses   . Wears partial dentures    top mid partial-flipper    Past Surgical History:  Procedure Laterality Date  . COLONOSCOPY    . DENTAL SURGERY     extractions  . EYE SURGERY    . ORIF PATELLA Right 01/24/2013   Procedure: OPEN REDUCTION INTERNAL (ORIF) FIXATION PATELLA;  Surgeon: Kerin Salen, MD;  Location: [facility];  Service: Orthopedics;  Laterality: Right;  orif right patella,patellectomy     Social History   Socioeconomic History  . Marital status: Married    Spouse name: Not on file  . Number of children: Not on file  . Years of education: Not on file  . Highest education level: Not on file  Occupational History  . Not on file  Social Needs  . Financial resource strain: Not on file  . Food insecurity:    Worry: Not on file    Inability:  Not on file  . Transportation needs:    Medical: Not on file    Non-medical: Not on file  Tobacco Use  . Smoking status: Never Smoker  . Smokeless tobacco: Never Used  Substance and Sexual Activity  . Alcohol use: No  . Drug use: No  . Sexual activity: Not on file  Lifestyle  . Physical activity:    Days per week: Not on file    Minutes per session: Not on file  . Stress: Not on file  Relationships  . Social connections:    Talks on phone: Not on file    Gets together: Not on file    Attends religious service: Not on file    Active member of club or organization: Not on file    Attends meetings of clubs or organizations: Not on file    Relationship status: Not on file  Other Topics Concern  . Not on file  Social History Narrative  . Not on file    Family History  Problem Relation Age of Onset  . Arthritis Mother   . Hearing loss Mother   . Hyperlipidemia Mother   . Hypertension Mother   . Cancer Father   . Diabetes Brother   .  Hyperlipidemia Brother   . Hypertension Brother     Health Maintenance  Topic Date Due  . Hepatitis C Screening  09-Sep-1955  . PNEUMOCOCCAL POLYSACCHARIDE VACCINE (1) 08/06/1956  . FOOT EXAM  08/06/1964  . OPHTHALMOLOGY EXAM  08/06/1964  . TETANUS/TDAP  08/06/1973  . COLONOSCOPY  08/06/2004  . HIV Screening  01/21/2019 (Originally 08/06/1969)  . INFLUENZA VACCINE  03/16/2018  . HEMOGLOBIN A1C  07/29/2018    ----------------------------------------------------------------------------------------------------------------------------------------------------------------------------------------------------------------- Physical Exam BP (!) 146/86   Pulse 64   Temp 98.9 F (37.2 C) (Oral)   Ht 5\' 10"  (1.778 m)   Wt (!) 308 lb 12.8 oz (140.1 kg)   SpO2 97%   BMI 44.31 kg/m   Physical Exam  Constitutional: He is oriented to person, place, and time. He appears well-nourished. No distress.  HENT:  Head: Normocephalic and atraumatic.    Mouth/Throat: Oropharynx is clear and moist.  Eyes: No scleral icterus.  Cardiovascular: Normal rate, regular rhythm and normal heart sounds.  Pulmonary/Chest: Effort normal and breath sounds normal.  Neurological: He is alert and oriented to person, place, and time.  Psychiatric: He has a normal mood and affect. His behavior is normal.    ------------------------------------------------------------------------------------------------------------------------------------------------------------------------------------------------------------------- Assessment and Plan  Type 2 diabetes mellitus without complication, without long-term current use of insulin (HCC) New diagnosis of diabetes Discussed treatment including lifestyle changes- Reduction in carbohydrate intake and regular exercise Recommend metformin initially, rx sent in Rx for glucometer, check glucose daily F/u with me in 6 weeks to review glucose/medication tolerance.  Instructed to call with any questions.

## 2018-01-30 NOTE — Assessment & Plan Note (Signed)
New diagnosis of diabetes Discussed treatment including lifestyle changes- Reduction in carbohydrate intake and regular exercise Recommend metformin initially, rx sent in Rx for glucometer, check glucose daily F/u with me in 6 weeks to review glucose/medication tolerance.  Instructed to call with any questions.

## 2018-02-01 ENCOUNTER — Other Ambulatory Visit: Payer: Self-pay | Admitting: Family Medicine

## 2018-02-01 MED ORDER — LISINOPRIL 10 MG PO TABS: 10.0000 mg | ORAL_TABLET | Freq: Every day | ORAL | 3 refills | Status: DC

## 2018-02-01 MED ORDER — MELOXICAM 15 MG PO TABS: 15.0000 mg | ORAL_TABLET | Freq: Every day | ORAL | 1 refills | Status: DC

## 2018-02-01 NOTE — Progress Notes (Signed)
Elevated microalbumin.  I would recommend that he start on lisinopril to help protect kidneys with diabetes.

## 2018-02-01 NOTE — Progress Notes (Signed)
Rx for lisinopril and meloxicam sent in.

## 2018-02-15 DIAGNOSIS — H4052X2 Glaucoma secondary to other eye disorders, left eye, moderate stage: Secondary | ICD-10-CM | POA: Diagnosis not present

## 2018-02-15 DIAGNOSIS — H4051X3 Glaucoma secondary to other eye disorders, right eye, severe stage: Secondary | ICD-10-CM | POA: Diagnosis not present

## 2018-03-06 ENCOUNTER — Encounter: Payer: Self-pay | Admitting: Family Medicine

## 2018-03-06 ENCOUNTER — Ambulatory Visit (INDEPENDENT_AMBULATORY_CARE_PROVIDER_SITE_OTHER): Payer: Medicare Other | Admitting: Family Medicine

## 2018-03-06 DIAGNOSIS — I10 Essential (primary) hypertension: Secondary | ICD-10-CM

## 2018-03-06 DIAGNOSIS — E119 Type 2 diabetes mellitus without complications: Secondary | ICD-10-CM | POA: Diagnosis not present

## 2018-03-06 NOTE — Assessment & Plan Note (Signed)
Well controlled Recommend continuation of current medication in combination with regular exercise and low salt diet.

## 2018-03-06 NOTE — Assessment & Plan Note (Signed)
Improved Glucometer readings from home indicate better control of blood sugars He will continue to work on healthy eating habits and weight loss.  F/u in 2 months, recheck a1c at that time.

## 2018-03-06 NOTE — Patient Instructions (Signed)
Diabetes Mellitus and Nutrition When you have diabetes (diabetes mellitus), it is very important to have healthy eating habits because your blood sugar (glucose) levels are greatly affected by what you eat and drink. Eating healthy foods in the appropriate amounts, at about the same times every day, can help you:  Control your blood glucose.  Lower your risk of heart disease.  Improve your blood pressure.  Reach or maintain a healthy weight.  Every person with diabetes is different, and each person has different needs for a meal plan. Your health care provider may recommend that you work with a diet and nutrition specialist (dietitian) to make a meal plan that is best for you. Your meal plan may vary depending on factors such as:  The calories you need.  The medicines you take.  Your weight.  Your blood glucose, blood pressure, and cholesterol levels.  Your activity level.  Other health conditions you have, such as heart or kidney disease.  How do carbohydrates affect me? Carbohydrates affect your blood glucose level more than any other type of food. Eating carbohydrates naturally increases the amount of glucose in your blood. Carbohydrate counting is a method for keeping track of how many carbohydrates you eat. Counting carbohydrates is important to keep your blood glucose at a healthy level, especially if you use insulin or take certain oral diabetes medicines. It is important to know how many carbohydrates you can safely have in each meal. This is different for every person. Your dietitian can help you calculate how many carbohydrates you should have at each meal and for snack. Foods that contain carbohydrates include:  Bread, cereal, rice, pasta, and crackers.  Potatoes and corn.  Peas, beans, and lentils.  Milk and yogurt.  Fruit and juice.  Desserts, such as cakes, cookies, ice cream, and candy.  How does alcohol affect me? Alcohol can cause a sudden decrease in blood  glucose (hypoglycemia), especially if you use insulin or take certain oral diabetes medicines. Hypoglycemia can be a life-threatening condition. Symptoms of hypoglycemia (sleepiness, dizziness, and confusion) are similar to symptoms of having too much alcohol. If your health care provider says that alcohol is safe for you, follow these guidelines:  Limit alcohol intake to no more than 1 drink per day for nonpregnant women and 2 drinks per day for men. One drink equals 12 oz of beer, 5 oz of wine, or 1 oz of hard liquor.  Do not drink on an empty stomach.  Keep yourself hydrated with water, diet soda, or unsweetened iced tea.  Keep in mind that regular soda, juice, and other mixers may contain a lot of sugar and must be counted as carbohydrates.  What are tips for following this plan? Reading food labels  Start by checking the serving size on the label. The amount of calories, carbohydrates, fats, and other nutrients listed on the label are based on one serving of the food. Many foods contain more than one serving per package.  Check the total grams (g) of carbohydrates in one serving. You can calculate the number of servings of carbohydrates in one serving by dividing the total carbohydrates by 15. For example, if a food has 30 g of total carbohydrates, it would be equal to 2 servings of carbohydrates.  Check the number of grams (g) of saturated and trans fats in one serving. Choose foods that have low or no amount of these fats.  Check the number of milligrams (mg) of sodium in one serving. Most people   should limit total sodium intake to less than 2,300 mg per day.  Always check the nutrition information of foods labeled as "low-fat" or "nonfat". These foods may be higher in added sugar or refined carbohydrates and should be avoided.  Talk to your dietitian to identify your daily goals for nutrients listed on the label. Shopping  Avoid buying canned, premade, or processed foods. These  foods tend to be high in fat, sodium, and added sugar.  Shop around the outside edge of the grocery store. This includes fresh fruits and vegetables, bulk grains, fresh meats, and fresh dairy. Cooking  Use low-heat cooking methods, such as baking, instead of high-heat cooking methods like deep frying.  Cook using healthy oils, such as olive, canola, or sunflower oil.  Avoid cooking with butter, cream, or high-fat meats. Meal planning  Eat meals and snacks regularly, preferably at the same times every day. Avoid going long periods of time without eating.  Eat foods high in fiber, such as fresh fruits, vegetables, beans, and whole grains. Talk to your dietitian about how many servings of carbohydrates you can eat at each meal.  Eat 4-6 ounces of lean protein each day, such as lean meat, chicken, fish, eggs, or tofu. 1 ounce is equal to 1 ounce of meat, chicken, or fish, 1 egg, or 1/4 cup of tofu.  Eat some foods each day that contain healthy fats, such as avocado, nuts, seeds, and fish. Lifestyle   Check your blood glucose regularly.  Exercise at least 30 minutes 5 or more days each week, or as told by your health care provider.  Take medicines as told by your health care provider.  Do not use any products that contain nicotine or tobacco, such as cigarettes and e-cigarettes. If you need help quitting, ask your health care provider.  Work with a counselor or diabetes educator to identify strategies to manage stress and any emotional and social challenges. What are some questions to ask my health care provider?  Do I need to meet with a diabetes educator?  Do I need to meet with a dietitian?  What number can I call if I have questions?  When are the best times to check my blood glucose? Where to find more information:  American Diabetes Association: diabetes.org/food-and-fitness/food  Academy of Nutrition and Dietetics:  www.eatright.org/resources/health/diseases-and-conditions/diabetes  National Institute of Diabetes and Digestive and Kidney Diseases (NIH): www.niddk.nih.gov/health-information/diabetes/overview/diet-eating-physical-activity Summary  A healthy meal plan will help you control your blood glucose and maintain a healthy lifestyle.  Working with a diet and nutrition specialist (dietitian) can help you make a meal plan that is best for you.  Keep in mind that carbohydrates and alcohol have immediate effects on your blood glucose levels. It is important to count carbohydrates and to use alcohol carefully. This information is not intended to replace advice given to you by your health care provider. Make sure you discuss any questions you have with your health care provider. Document Released: 04/29/2005 Document Revised: 09/06/2016 Document Reviewed: 09/06/2016 Elsevier Interactive Patient Education  2018 Elsevier Inc.  

## 2018-03-06 NOTE — Progress Notes (Signed)
Tanner Young - 62 y.o. male MRN [id number]  Date of birth: [birth year]  Subjective Chief Complaint  Patient presents with  . Follow-up    F/U for DM, been keeping track once a week, been consistant and going down.    HPI Tanner Young is a 62 y.o. male with a history of T2DM, GERD, CKD, HTN, Glaucoma and OSA here today for follow up of DM and HTN.    -DM:  Reports he is doing well.  Using a meter from the [place] and checking glucose weekly.  Readings have steadily decreased since addition of metformin from 140-150 to around 110.  He is tolerating metformin well and denies side effects at this time.  He sees an ophthalmologist regularly for glaucoma and believes he had a pneumovax earlier this year through the [place].  He and his wife are planning on starting a weight loss program and see a nutritionist.   -HTN:  Reports compliance with current medications without side effects.  Denies symptoms of hypotension.  BP at home has been well controlled.  He denies anginal symptoms, headache, increased vision change, shortness of breath, or edema.   ROS:  A comprehensive ROS was completed and negative except as noted per HPI Allergies  Allergen Reactions  . Shellfish Allergy Itching and Nausea And Vomiting    Shrimp     Past Medical History:  Diagnosis Date  . Arthritis   . Chronic back pain   . Chronic kidney disease   . GERD (gastroesophageal reflux disease)   . Glaucoma, both eyes   . Hypertension   . Neuropathy    rt leg from DDD  . Sleep apnea    uses a cpap  . Wears glasses   . Wears partial dentures    top mid partial-flipper    Past Surgical History:  Procedure Laterality Date  . COLONOSCOPY    . DENTAL SURGERY     extractions  . EYE SURGERY    . ORIF PATELLA Right 01/24/2013   Procedure: OPEN REDUCTION INTERNAL (ORIF) FIXATION PATELLA;  Surgeon: Kerin Salen, MD;  Location: [facility];  Service: Orthopedics;  Laterality: Right;  orif right  patella,patellectomy     Social History   Socioeconomic History  . Marital status: Married    Spouse name: Not on file  . Number of children: Not on file  . Years of education: Not on file  . Highest education level: Not on file  Occupational History  . Not on file  Social Needs  . Financial resource strain: Not on file  . Food insecurity:    Worry: Not on file    Inability: Not on file  . Transportation needs:    Medical: Not on file    Non-medical: Not on file  Tobacco Use  . Smoking status: Never Smoker  . Smokeless tobacco: Never Used  Substance and Sexual Activity  . Alcohol use: No  . Drug use: No  . Sexual activity: Not on file  Lifestyle  . Physical activity:    Days per week: Not on file    Minutes per session: Not on file  . Stress: Not on file  Relationships  . Social connections:    Talks on phone: Not on file    Gets together: Not on file    Attends religious service: Not on file    Active member of club or organization: Not on file    Attends meetings of clubs or organizations:  Not on file    Relationship status: Not on file  Other Topics Concern  . Not on file  Social History Narrative  . Not on file    Family History  Problem Relation Age of Onset  . Arthritis Mother   . Hearing loss Mother   . Hyperlipidemia Mother   . Hypertension Mother   . Cancer Father   . Diabetes Brother   . Hyperlipidemia Brother   . Hypertension Brother     Health Maintenance  Topic Date Due  . Hepatitis C Screening  1955/10/28  . PNEUMOCOCCAL POLYSACCHARIDE VACCINE (1) 08/06/1956  . FOOT EXAM  08/06/1964  . OPHTHALMOLOGY EXAM  08/06/1964  . TETANUS/TDAP  08/06/1973  . COLONOSCOPY  08/06/2004  . HIV Screening  01/21/2019 (Originally 08/06/1969)  . INFLUENZA VACCINE  03/16/2018  . HEMOGLOBIN A1C  07/29/2018     ----------------------------------------------------------------------------------------------------------------------------------------------------------------------------------------------------------------- Physical Exam BP 124/78 (BP Location: Left Arm, Patient Position: Sitting, Cuff Size: Large)   Pulse (!) 51   Temp 98.5 F (36.9 C) (Oral)   Ht 5\' 10"  (1.778 m)   Wt 289 lb 14.4 oz (131.5 kg)   SpO2 95%   BMI 41.60 kg/m   Physical Exam  Constitutional: He is oriented to person, place, and time. He appears well-nourished. No distress.  HENT:  Head: Normocephalic and atraumatic.  Mouth/Throat: Oropharynx is clear and moist.  Eyes: No scleral icterus.  Cardiovascular: Normal rate, regular rhythm and normal heart sounds.  Pulses:      Dorsalis pedis pulses are 2+ on the right side.       Posterior tibial pulses are 2+ on the right side.  Pulmonary/Chest: Effort normal and breath sounds normal.  Musculoskeletal: He exhibits no edema.       Right foot: There is normal range of motion and no deformity.       Left foot: There is normal range of motion and no deformity.  Feet:  Right Foot:  Protective Sensation: 4 sites tested. 4 sites sensed.  Skin Integrity: Negative for ulcer, blister, skin breakdown, erythema, warmth, callus or dry skin.  Left Foot:  Protective Sensation: 4 sites tested. 4 sites sensed.  Skin Integrity: Negative for ulcer, blister, skin breakdown, erythema, warmth, callus or dry skin.  Neurological: He is alert and oriented to person, place, and time.  Psychiatric: He has a normal mood and affect. His behavior is normal.    ------------------------------------------------------------------------------------------------------------------------------------------------------------------------------------------------------------------- Assessment and Plan  Essential hypertension Well controlled Recommend continuation of current medication in combination  with regular exercise and low salt diet.   Type 2 diabetes mellitus without complication, without long-term current use of insulin (HCC) Improved Glucometer readings from home indicate better control of blood sugars He will continue to work on healthy eating habits and weight loss.  F/u in 2 months, recheck a1c at that time.

## 2018-03-13 ENCOUNTER — Ambulatory Visit: Payer: 59 | Admitting: Family Medicine

## 2018-03-28 ENCOUNTER — Other Ambulatory Visit: Payer: Self-pay | Admitting: Family Medicine

## 2018-04-27 ENCOUNTER — Other Ambulatory Visit: Payer: Self-pay | Admitting: Family Medicine

## 2018-05-02 ENCOUNTER — Encounter (INDEPENDENT_AMBULATORY_CARE_PROVIDER_SITE_OTHER): Payer: Self-pay

## 2018-05-08 ENCOUNTER — Encounter (INDEPENDENT_AMBULATORY_CARE_PROVIDER_SITE_OTHER): Payer: Self-pay | Admitting: Bariatrics

## 2018-05-08 ENCOUNTER — Ambulatory Visit: Payer: Medicare Other | Admitting: Family Medicine

## 2018-05-08 ENCOUNTER — Ambulatory Visit (INDEPENDENT_AMBULATORY_CARE_PROVIDER_SITE_OTHER): Payer: Medicare Other | Admitting: Bariatrics

## 2018-05-08 VITALS — BP 96/62 | HR 64 | Temp 97.9°F | Ht 69.0 in | Wt 263.0 lb

## 2018-05-08 DIAGNOSIS — I10 Essential (primary) hypertension: Secondary | ICD-10-CM | POA: Diagnosis not present

## 2018-05-08 DIAGNOSIS — Z1331 Encounter for screening for depression: Secondary | ICD-10-CM | POA: Diagnosis not present

## 2018-05-08 DIAGNOSIS — E119 Type 2 diabetes mellitus without complications: Secondary | ICD-10-CM | POA: Diagnosis not present

## 2018-05-08 DIAGNOSIS — G4733 Obstructive sleep apnea (adult) (pediatric): Secondary | ICD-10-CM

## 2018-05-08 DIAGNOSIS — R5383 Other fatigue: Secondary | ICD-10-CM

## 2018-05-08 DIAGNOSIS — E7849 Other hyperlipidemia: Secondary | ICD-10-CM

## 2018-05-08 DIAGNOSIS — Z0289 Encounter for other administrative examinations: Secondary | ICD-10-CM

## 2018-05-08 DIAGNOSIS — E559 Vitamin D deficiency, unspecified: Secondary | ICD-10-CM | POA: Diagnosis not present

## 2018-05-08 DIAGNOSIS — R0602 Shortness of breath: Secondary | ICD-10-CM | POA: Diagnosis not present

## 2018-05-08 DIAGNOSIS — Z6839 Body mass index (BMI) 39.0-39.9, adult: Secondary | ICD-10-CM

## 2018-05-09 DIAGNOSIS — E119 Type 2 diabetes mellitus without complications: Secondary | ICD-10-CM | POA: Diagnosis not present

## 2018-05-09 DIAGNOSIS — H35363 Drusen (degenerative) of macula, bilateral: Secondary | ICD-10-CM | POA: Diagnosis not present

## 2018-05-09 DIAGNOSIS — H47231 Glaucomatous optic atrophy, right eye: Secondary | ICD-10-CM | POA: Diagnosis not present

## 2018-05-09 DIAGNOSIS — H401132 Primary open-angle glaucoma, bilateral, moderate stage: Secondary | ICD-10-CM | POA: Diagnosis not present

## 2018-05-09 LAB — LIPID PANEL WITH LDL/HDL RATIO
Cholesterol, Total: 162 mg/dL (ref 100–199)
HDL: 42 mg/dL (ref >39)
LDL Calculated: 95 mg/dL (ref 0–99)
LDL/HDL RATIO: 2.3 ratio (ref 0.0–3.6)
Triglycerides: 123 mg/dL (ref 0–149)
VLDL Cholesterol Cal: 25 mg/dL (ref 5–40)

## 2018-05-09 LAB — HEMOGLOBIN A1C
Est. average glucose Bld gHb Est-mCnc: 131 mg/dL
HEMOGLOBIN A1C: 6.2 % — AB (ref 4.8–5.6)

## 2018-05-09 LAB — TSH: TSH: 2.67 u[IU]/mL (ref 0.450–4.500)

## 2018-05-09 LAB — T3: T3 TOTAL: 116 ng/dL (ref 71–180)

## 2018-05-09 LAB — INSULIN, RANDOM: INSULIN: 19.3 u[IU]/mL (ref 2.6–24.9)

## 2018-05-09 LAB — VITAMIN D 25 HYDROXY (VIT D DEFICIENCY, FRACTURES): Vit D, 25-Hydroxy: 45.5 ng/mL (ref 30.0–100.0)

## 2018-05-09 LAB — T4, FREE: FREE T4: 1.16 ng/dL (ref 0.82–1.77)

## 2018-05-09 NOTE — Progress Notes (Signed)
Office: [phone]  /  Fax: [phone]   Dear Dr. Zigmund Daniel,   Thank you for referring Tanner Young to our clinic. The following note includes my evaluation and treatment recommendations.  HPI:   Chief Complaint: OBESITY    Tanner Young has been referred by [name], DO for consultation regarding his obesity and obesity related comorbidities.    Tanner Young (MR# [id number]) is a 62 y.o. male who presents on 05/09/2018 for obesity evaluation and treatment. Current BMI is Body mass index is 38.84 kg/m.[name] Tanner Young has been struggling with his weight for many years and has been unsuccessful in either losing weight, maintaining weight loss, or reaching his healthy weight goal. Tanner Young has been snacking between meals and Tanner Young has problems with portion control. Tanner Young does not have excessive polyphagia     Onofrio attended our information session and states Tanner Young is currently in the action stage of change and ready to dedicate time achieving and maintaining a healthier weight. Tanner Young is interested in becoming our patient and working on intensive lifestyle modifications including (but not limited to) diet, exercise and weight loss.    Tanner Young states his family eats meals together Tanner Young thinks his family will eat healthier with  him his desired weight loss is 79 lbs Tanner Young started gaining weight when she broke her knee and hurt her back his heaviest weight ever was 317 lbs. Tanner Young frequently eats larger portions than normal    Fatigue Tanner Young feels his energy is lower than it should be. This has worsened with weight gain and has not worsened recently. Tanner Young admits to daytime somnolence and denies waking up still tired. Patient has a diagnosis of obstructive sleep apnea and Tanner Young wears CPAP at night. His fatigue improved with wearing CPAP. Patent has a history of symptoms of daytime fatigue and hypertension. Patient generally gets 6 1/2 to 7 hours of sleep per night, and states they generally have restful  sleep. Snoring is present. Apneic episodes are present. Epworth Sleepiness Score is 8  Dyspnea on exertion Tanner Young notes increasing shortness of breath with activities such as walking  Or walking up hill and seems to be worsening over time with weight gain. Tanner Young notes getting out of breath sooner with activity than Tanner Young used to. This has not gotten worse recently. Tanner Young denies orthopnea.  Diabetes II  Tanner Young has a diagnosis of diabetes type II and was newly diagnosed June 2019. Tanner Young states fasting BGs range between 90 and 120's and Tanner Young denies any hypoglycemic episodes. Last A1c was at 7.6 on 01/27/18. Zohan is taking metformin without side effects. Tanner Young is attempting to work on intensive lifestyle modifications including diet, exercise, and weight loss to help control his blood glucose levels. Tanner Young denies polydipsia or polyuria.  Hyperlipidemia Tanner Young has hyperlipidemia and Tanner Young is attempting to improve his cholesterol levels with intensive lifestyle modification including a low saturated fat diet, exercise and weight loss. Tanner Young is taking Simvastatin currently without side effects. Tanner Young denies any chest pain, claudication or myalgias.  Hypertension Tanner Young is a 62 y.o. male with hypertension and Tanner Young is taking Amlodipine and Maxzide with no side effects. Tanner Young denies lightheadedness. Tanner Young is attempting to work on weight loss to help control his blood pressure with the goal of decreasing his risk of heart attack and stroke. [name] blood pressure is currently controlled.  Sleep Apnea Tanner Young has a diagnosis of sleep apnea and Tanner Young wears CPAP at night, which helps with energy. His fatigue has improved  with wearing CPAP.  Vitamin D deficiency Tanner Young has a diagnosis of vitamin D deficiency. Tanner Young is not currently taking vit D and denies nausea, vomiting or muscle weakness.  Depression Screen Tanner Young's Food and Mood (modified PHQ-9) score was  Depression screen PHQ 2/9 05/08/2018  Decreased  Interest 0  Down, Depressed, Hopeless 0  PHQ - 2 Score 0  Altered sleeping 2  Tired, decreased energy 2  Change in appetite 0  Feeling bad or failure about yourself  0  Trouble concentrating 0  Moving slowly or fidgety/restless 1  Suicidal thoughts 0  PHQ-9 Score 5  Difficult doing work/chores Not difficult at all    ALLERGIES: Allergies  Allergen Reactions  . Shellfish Allergy Itching and Nausea And Vomiting    Shrimp     MEDICATIONS: Current Outpatient Medications on File Prior to Visit  Medication Sig Dispense Refill  . amLODipine (NORVASC) 10 MG tablet Take 10 mg by mouth daily.     . blood glucose meter kit and supplies KIT Dispense based on patient and insurance preference. Use daily as directed to monitor blood sugar. (FOR ICD-9 250.00, 250.01). 1 each 0  . brimonidine (ALPHAGAN) 0.15 % ophthalmic solution Place 1 drop into both eyes 2 (two) times daily at 10 AM and 5 PM.    . cetirizine (ZYRTEC) 10 MG tablet Take 10 mg by mouth daily.    . cholecalciferol (VITAMIN D) 1000 UNITS tablet Take 1,000 Units by mouth daily.    [name] desonide (DESOWEN) 0.05 % cream Apply topically 2 (two) times daily.    . dorzolamide-timolol (COSOPT) 22.3-6.8 MG/ML ophthalmic solution Place 1 drop into both eyes 2 (two) times daily.     [name] gabapentin (NEURONTIN) 300 MG capsule Take 300 mg by mouth at bedtime.    [name] ketoconazole (NIZORAL) 2 % cream Apply 1 application topically daily.    . metFORMIN (GLUCOPHAGE-XR) 500 MG 24 hr tablet Take 1 tablet (500 mg total) by mouth daily with breakfast. 90 tablet 1  . moxifloxacin (VIGAMOX) 0.5 % ophthalmic solution 1 drop 3 (three) times daily.    [name] omeprazole (PRILOSEC) 20 MG capsule Take 20 mg by mouth at bedtime.    [name] PRESCRIPTION MEDICATION Apply 1 application topically daily. Cream to feet    . PRESCRIPTION MEDICATION Apply 1 application topically daily. Cream to back    . RHOPRESSA 0.02 % SOLN INT 1 GTT IN OU Q NIGHT  3  . sildenafil (VIAGRA) 100 MG  tablet Take 100 mg by mouth daily as needed for erectile dysfunction.    . simvastatin (ZOCOR) 20 MG tablet Take 10 mg by mouth at bedtime.    . terbinafine (LAMISIL) 1 % cream Apply 1 application topically 2 (two) times daily.    [name] triamcinolone ointment (KENALOG) 0.1 % Apply 1 application topically 2 (two) times daily.    [name] triamterene-hydrochlorothiazide (MAXZIDE) 75-50 MG per tablet Take 0.5 tablets by mouth daily.     No current facility-administered medications on file prior to visit.     PAST MEDICAL HISTORY: Past Medical History:  Diagnosis Date  . Arthritis   . Chronic back pain   . Chronic kidney disease   . Cough   . Diabetes mellitus type II, controlled ([place])   . Dry skin   . Floaters in visual field   . GERD (gastroesophageal reflux disease)   . Glaucoma, both eyes   . Hay fever   . Heartburn   . Hyperlipemia   . Hypertension   .  Itching   . Joint pain   . Kidney disease   . Lactose intolerance   . Leg cramp   . Low back pain   . Multiple food allergies   . Muscle pain   . Neuropathy    rt leg from DDD  . Rash   . Sleep apnea    uses a cpap  . Stiff neck   . Swelling of lower extremity   . Trouble in sleeping   . Vision changes   . Vitamin D deficiency   . Wears glasses   . Wears partial dentures    top mid partial-flipper    PAST SURGICAL HISTORY: Past Surgical History:  Procedure Laterality Date  . COLONOSCOPY    . DENTAL SURGERY     extractions  . EYE SURGERY    . ORIF PATELLA Right 01/24/2013   Procedure: OPEN REDUCTION INTERNAL (ORIF) FIXATION PATELLA;  Surgeon: Kerin Salen, MD;  Location: [facility];  Service: Orthopedics;  Laterality: Right;  orif right patella,patellectomy     SOCIAL HISTORY: Social History   Tobacco Use  . Smoking status: Never Smoker  . Smokeless tobacco: Never Used  Substance Use Topics  . Alcohol use: No  . Drug use: No    FAMILY HISTORY: Family History  Problem Relation Age of Onset  .  Arthritis Mother   . Hearing loss Mother   . Hyperlipidemia Mother   . Hypertension Mother   . Diabetes Mother   . Cancer Father   . Hypertension Father   . Diabetes Brother   . Hyperlipidemia Brother   . Hypertension Brother     ROS: Review of Systems  Constitutional: Positive for malaise/fatigue.  Respiratory: Positive for shortness of breath (on exertion).   Cardiovascular: Negative for chest pain, orthopnea and claudication.  Gastrointestinal: Negative for diarrhea, nausea and vomiting.  Genitourinary: Negative for frequency.  Musculoskeletal: Negative for myalgias.       Negative for muscle weakness  Neurological:       Negative for lightheadedness  Endo/Heme/Allergies: Negative for polydipsia.       Negative for hypoglycemia    PHYSICAL EXAM: Blood pressure 96/62, pulse 64, temperature 97.9 F (36.6 C), temperature source Oral, height '5\' 9"'$  (1.753 m), weight 263 lb (119.3 kg), SpO2 97 %. Body mass index is 38.84 kg/m. Physical Exam  Constitutional: Tanner Young is oriented to person, place, and time. Tanner Young appears well-developed and well-nourished.  HENT:  Head: Normocephalic and atraumatic.  Nose: Nose normal.  Mallanpati = 2  Eyes: EOM are normal. No scleral icterus.  Neck: Normal range of motion. Neck supple. No thyromegaly present.  Cardiovascular: Regular rhythm. Bradycardia present.  Pulmonary/Chest: Effort normal. No respiratory distress.  Abdominal: Soft. There is no tenderness.  + Obesity  Musculoskeletal: Normal range of motion. Tanner Young exhibits edema (trace edmea bilateral lower extremities).  Range of Motion normal in all 4 extremities  Neurological: Tanner Young is alert and oriented to person, place, and time. Coordination normal.  Skin: Skin is warm and dry.  Psychiatric: Tanner Young has a normal mood and affect. His behavior is normal.  Vitals reviewed.   RECENT LABS AND TESTS: BMET    Component Value Date/Time   NA 137 01/20/2018 1057   K 4.0 01/20/2018 1057   CL 103  01/20/2018 1057   CO2 25 01/20/2018 1057   GLUCOSE 218 (H) 01/20/2018 1057   BUN 14 01/20/2018 1057   CREATININE 1.18 01/20/2018 1057   CALCIUM 9.5 01/20/2018 1057  Lab Results  Component Value Date   HGBA1C 6.2 (H) 05/08/2018   Lab Results  Component Value Date   INSULIN 19.3 05/08/2018   CBC    Component Value Date/Time   WBC 5.2 01/20/2018 1057   RBC 4.28 01/20/2018 1057   HGB 13.0 01/20/2018 1057   HCT 37.9 (L) 01/20/2018 1057   PLT 188.0 01/20/2018 1057   MCV 88.7 01/20/2018 1057   MCHC 34.4 01/20/2018 1057   RDW 13.6 01/20/2018 1057   Iron/TIBC/Ferritin/ %Sat No results found for: IRON, TIBC, FERRITIN, IRONPCTSAT Lipid Panel     Component Value Date/Time   CHOL 162 05/08/2018 1043   TRIG 123 05/08/2018 1043   HDL 42 05/08/2018 1043   LDLCALC 95 05/08/2018 1043   Hepatic Function Panel     Component Value Date/Time   PROT 6.7 01/20/2018 1057   ALBUMIN 4.0 01/20/2018 1057   AST 21 01/20/2018 1057   ALT 24 01/20/2018 1057   ALKPHOS 61 01/20/2018 1057   BILITOT 0.7 01/20/2018 1057      Component Value Date/Time   TSH 2.670 05/08/2018 1043   TSH 1.75 01/20/2018 1057    ECG  shows NSR with a rate of 46 BPM INDIRECT CALORIMETER done today shows a VO2 of 313 and a REE of 2176.  His calculated basal metabolic rate is 2094 thus his basal metabolic rate is better than expected.    ASSESSMENT AND PLAN: Other fatigue - Plan: EKG 12-Lead, T3, T4, free, TSH  Shortness of breath on exertion  Essential hypertension  Obstructive sleep apnea syndrome  Vitamin D deficiency - Plan: VITAMIN D 25 Hydroxy (Vit-D Deficiency, Fractures)  Other hyperlipidemia - Plan: Lipid Panel With LDL/HDL Ratio  Type 2 diabetes mellitus without complication, without long-term current use of insulin (HCC) - Plan: Hemoglobin A1c, Insulin, random  Depression screening  Class 2 severe obesity with serious comorbidity and body mass index (BMI) of 39.0 to 39.9 in adult, unspecified  obesity type ([place])  PLAN: Fatigue Rice was informed that his fatigue may be related to obesity, depression or many other causes. Labs will be ordered, and in the meanwhile Tanner Young has agreed to work on diet and weight loss to help with fatigue and Tanner Young will continue his current exercise regimen and will increase exercise in the future.. Proper sleep hygiene was discussed including the need for 7-8 hours of quality sleep each night. A sleep study was not ordered based on symptoms and Epworth score.  Dyspnea on exertion Waris's shortness of breath appears to be obesity related and exercise induced. Tanner Young has agreed to work on weight loss and gradually increase exercise to treat his exercise induced shortness of breath. If Tanner Young follows our instructions and loses weight without improvement of his shortness of breath, we will plan to refer to pulmonology. We will monitor this condition regularly. Tanner Young agrees to this plan.  Diabetes II Tanner Young has been given extensive diabetes education by myself today including ideal fasting and post-prandial blood glucose readings, individual ideal Hgb A1c goals and hypoglycemia prevention. We discussed the importance of good blood sugar control to decrease the likelihood of diabetic complications such as nephropathy, neuropathy, limb loss, blindness, coronary artery disease, and death. We discussed the importance of intensive lifestyle modification including diet, exercise and weight loss as the first line treatment for diabetes. Tanner Young will work on decreasing simple carbohydrates and will increase his lean protein intake. We will check Hgb A1c and fasting insulin today. Tanner Young agrees to continue his diabetes  medications and will follow up at the agreed upon time. Krish will work on   Hyperlipidemia Lautaro was informed of the American Heart Association Guidelines emphasizing intensive lifestyle modifications as the first line treatment for hyperlipidemia. We discussed  many lifestyle modifications today in depth, and Hank will continue to work on decreasing carbohydrates for triglycerides. Tanner Young will work on decreasing saturated fats such as fatty red meat, butter and many fried foods for LD. Tanner Young will also increase vegetables and lean protein in his diet and work on exercise and weight loss efforts. Anton will continue Simvastatin. We will check LDL and total cholesterol today and Asa agrees to follow up as directed.  Hypertension We discussed sodium restriction, working on healthy weight loss, and a regular exercise program as the means to achieve improved blood pressure control. Tristian agreed with this plan and agreed to follow up as directed. We will continue to monitor his blood pressure as well as his progress with the above lifestyle modifications. Tanner Young will continue his anti hypertensive medications as prescribed and will watch for signs of hypotension as Tanner Young continues his lifestyle modifications.  Sleep Apnea Dorris will continue wearing his CPAP machine every night. We discussed the importance of CPAP for his general health and weight. Weight loss should improve his sleep apnea.  Vitamin D Deficiency Kipton was informed that low vitamin D levels contributes to fatigue and are associated with obesity, breast, and colon cancer. Tanner Young will follow up for routine testing of vitamin D, at least 2-3 times per year. We will check vitamin D level today and Griffin will increase vitamin D rich foods in his diet.  Depression Screen Elston had a mildly positive depression screening. Depression is commonly associated with obesity and often results in emotional eating behaviors. We will monitor this closely and work on CBT to help improve the non-hunger eating patterns. Referral to Psychology may be required if no improvement is seen as Tanner Young continues in our clinic.  Obesity Bruno is currently in the action stage of change and his goal is to continue with weight loss efforts.  I recommend Demarrion begin the structured treatment plan as follows:  Tanner Young will begin a consistent eating plan and has agreed to follow the Category 3 plan Felton has been instructed to eventually work up to a goal of 150 minutes of combined cardio and strengthening exercise per week for weight loss and overall health benefits. We discussed the following Behavioral Modification Strategies today: Tanner Young will pick better snacking choices, increase H2O intake, no skipping meals, increasing lean protein intake, decreasing simple carbohydrates , increasing vegetables, decrease eating out and work on meal planning and easy cooking plans   Tanner Young was informed of the importance of frequent follow up visits to maximize his success with intensive lifestyle modifications for his multiple health conditions. Tanner Young was informed we would discuss his lab results at his next visit unless there is a critical issue that needs to be addressed sooner. Aayan agreed to keep his next visit at the agreed upon time to discuss these results.    OBESITY BEHAVIORAL INTERVENTION VISIT  Today's visit was # 1   Starting weight: 263 lbs Starting date: 05/08/18 Today's weight : 263 lbs Today's date: 05/08/2018 Total lbs lost to date: 0 At least 15 minutes were spent on discussing the following behavioral intervention visit.   ASK: We discussed the diagnosis of obesity with Tanner Young today and Jaevion agreed to give [name] permission to discuss obesity behavioral modification therapy  today.  ASSESS: Eliyas has the diagnosis of obesity and his BMI today is 38.82 Gaylon is in the action stage of change   ADVISE: Olegario was educated on the multiple health risks of obesity as well as the benefit of weight loss to improve his health. Tanner Young was advised of the need for long term treatment and the importance of lifestyle modifications to improve his current health and to decrease his risk of future health problems.  AGREE: Multiple dietary  modification options and treatment options were discussed and  Jacson agreed to follow the recommendations documented in the above note.  ARRANGE: Kert was educated on the importance of frequent visits to treat obesity as outlined per CMS and USPSTF guidelines and agreed to schedule his next follow up appointment today.  Corey Skains, am acting as Location manager for [employer]. Owens Shark, DO  I have reviewed the above documentation for accuracy and completeness, and I agree with the above. -Jearld Lesch, DO

## 2018-05-10 ENCOUNTER — Encounter: Payer: Self-pay | Admitting: Family Medicine

## 2018-05-10 ENCOUNTER — Ambulatory Visit (INDEPENDENT_AMBULATORY_CARE_PROVIDER_SITE_OTHER): Payer: Medicare Other | Admitting: Family Medicine

## 2018-05-10 VITALS — BP 112/76 | HR 52 | Temp 98.3°F | Ht 70.0 in | Wt 268.6 lb

## 2018-05-10 DIAGNOSIS — E119 Type 2 diabetes mellitus without complications: Secondary | ICD-10-CM

## 2018-05-10 DIAGNOSIS — M1991 Primary osteoarthritis, unspecified site: Secondary | ICD-10-CM

## 2018-05-10 DIAGNOSIS — I10 Essential (primary) hypertension: Secondary | ICD-10-CM

## 2018-05-10 DIAGNOSIS — Z9989 Dependence on other enabling machines and devices: Secondary | ICD-10-CM | POA: Diagnosis not present

## 2018-05-10 DIAGNOSIS — G4733 Obstructive sleep apnea (adult) (pediatric): Secondary | ICD-10-CM | POA: Diagnosis not present

## 2018-05-10 NOTE — Patient Instructions (Signed)
Your diabetes, blood pressure and cholesterol are well controlled!  Keep up the good work!  Ask the [facility] about shingles vaccine.   I will see you back in about 6 months or sooner if needed

## 2018-05-10 NOTE — Progress Notes (Signed)
Tanner Young - 62 y.o. male MRN [id number]  Date of birth: 09-Feb-1956  Subjective Chief Complaint  Patient presents with  . Diabetes    HPI Tanner Young is a 62 y.o. male with history of T2DM, HTN, OSA, and OA here today for follow up.  He is co-managed through the [place] as well.  He is currently receiving the majority of his medications through the [place].  He has also started seeing bariatric medicine to help with weight loss.   -DM:  Current treatment with metformin, doing well with this. Had A1c a couple of days ago that was 6.2%, improved from previous check.  Blood sugars at home are consistently in the low 100's.  He denies symptoms of hypoglycemia, polyuria/polydipsia.  He is seeing a podiatrist.  He denies neuropathic symptoms.  Tanner Young is down about 20 lbs since July.   -HTN:  Current tx with benazepril and amlodipine, doing well with this.  He denies side effects including symptoms of hypotension or chronic cough.  He does monitor BP at home.   He is compliant with CPAP therapy.  He denies a high salt diet.   He has not had anginal symptoms, shortness of breath, palpitations, headache or vision changes.   -OA:  Using meloxicam as needed.  This seems to be working well for him.  Denies side effects from medication including GI upset.   Health maintenance:  Had flu vaccine and colonoscopy through [place] recently.  Has had pneumovax previously.  Interested in shingrix.   ROS:  A comprehensive ROS was completed and negative except as noted per HPI  Allergies  Allergen Reactions  . Shellfish Allergy Itching and Nausea And Vomiting    Shrimp     Past Medical History:  Diagnosis Date  . Arthritis   . Chronic back pain   . Chronic kidney disease   . Cough   . Diabetes mellitus type II, controlled ([place])   . Dry skin   . Floaters in visual field   . GERD (gastroesophageal reflux disease)   . Glaucoma, both eyes   . Hay fever   . Heartburn   . Hyperlipemia   . Hypertension   .  Itching   . Joint pain   . Kidney disease   . Lactose intolerance   . Leg cramp   . Low back pain   . Multiple food allergies   . Muscle pain   . Neuropathy    rt leg from DDD  . Rash   . Sleep apnea    uses a cpap  . Stiff neck   . Swelling of lower extremity   . Trouble in sleeping   . Vision changes   . Vitamin D deficiency   . Wears glasses   . Wears partial dentures    top mid partial-flipper    Past Surgical History:  Procedure Laterality Date  . COLONOSCOPY    . DENTAL SURGERY     extractions  . EYE SURGERY    . ORIF PATELLA Right 01/24/2013   Procedure: OPEN REDUCTION INTERNAL (ORIF) FIXATION PATELLA;  Surgeon: Kerin Salen, MD;  Location: [facility];  Service: Orthopedics;  Laterality: Right;  orif right patella,patellectomy     Social History   Socioeconomic History  . Marital status: Married    Spouse name: Runner, broadcasting/film/video  . Number of children: Not on file  . Years of education: Not on file  . Highest education level: Not on file  Occupational History  . Occupation: Retired  Scientific laboratory technician  . Financial resource strain: Not on file  . Food insecurity:    Worry: Not on file    Inability: Not on file  . Transportation needs:    Medical: Not on file    Non-medical: Not on file  Tobacco Use  . Smoking status: Never Smoker  . Smokeless tobacco: Never Used  Substance and Sexual Activity  . Alcohol use: No  . Drug use: No  . Sexual activity: Not on file  Lifestyle  . Physical activity:    Days per week: Not on file    Minutes per session: Not on file  . Stress: Not on file  Relationships  . Social connections:    Talks on phone: Not on file    Gets together: Not on file    Attends religious service: Not on file    Active member of club or organization: Not on file    Attends meetings of clubs or organizations: Not on file    Relationship status: Not on file  Other Topics Concern  . Not on file  Social History Narrative  . Not on file      Family History  Problem Relation Age of Onset  . Arthritis Mother   . Hearing loss Mother   . Hyperlipidemia Mother   . Hypertension Mother   . Diabetes Mother   . Cancer Father   . Hypertension Father   . Diabetes Brother   . Hyperlipidemia Brother   . Hypertension Brother     Health Maintenance  Topic Date Due  . Hepatitis C Screening  1956-05-27  . PNEUMOCOCCAL POLYSACCHARIDE VACCINE AGE 72-64 HIGH RISK  08/06/1956  . FOOT EXAM  08/06/1964  . TETANUS/TDAP  08/06/1973  . COLONOSCOPY  08/06/2004  . INFLUENZA VACCINE  03/16/2018  . HIV Screening  01/21/2019 (Originally 08/06/1969)  . HEMOGLOBIN A1C  11/06/2018  . URINE MICROALBUMIN  01/31/2019  . OPHTHALMOLOGY EXAM  02/16/2019    ----------------------------------------------------------------------------------------------------------------------------------------------------------------------------------------------------------------- Physical Exam BP 112/76   Pulse (!) 52   Temp 98.3 F (36.8 C)   Ht 5\' 10"  (1.778 m)   Wt 268 lb 9.6 oz (121.8 kg)   SpO2 97%   BMI 38.54 kg/m   Physical Exam  Constitutional: He is oriented to person, place, and time. He appears well-nourished. No distress.  HENT:  Head: Normocephalic and atraumatic.  Mouth/Throat: Oropharynx is clear and moist.  Eyes: No scleral icterus.  Neck: No thyromegaly present.  Cardiovascular: Normal rate, regular rhythm and normal heart sounds.  Pulmonary/Chest: Effort normal and breath sounds normal.  Musculoskeletal: He exhibits no tenderness.  Lymphadenopathy:    He has no cervical adenopathy.  Neurological: He is alert and oriented to person, place, and time. He exhibits normal muscle tone. Coordination normal.  Skin: Skin is warm and dry.  Psychiatric: He has a normal mood and affect. His behavior is normal.     ------------------------------------------------------------------------------------------------------------------------------------------------------------------------------------------------------------------- Assessment and Plan  Essential hypertension Blood pressure is at goal at for age and co-morbidities.  I recommend he continue medications.  In addition they were instructed to follow a low sodium diet with regular exercise to help to maintain adequate control of blood pressure.    Type 2 diabetes mellitus without complication, without long-term current use of insulin (HCC) Most recent A1c of  Lab Results  Component Value Date   HGBA1C 6.2 (H) 05/08/2018   indicates diabetes is well controlled.  he  will continue current medication.  Congratulated on weight loss.   Counseled on healthy, low carb diet and recommend frequent activity to help with maintaining good control of blood sugars.    OSA on CPAP Stable, followed by neurology.   Osteoarthritis Well controlled with meloxicam prn, continue.

## 2018-05-10 NOTE — Assessment & Plan Note (Signed)
Most recent A1c of  Lab Results  Component Value Date   HGBA1C 6.2 (H) 05/08/2018   indicates diabetes is well controlled.  he  will continue current medication.  Congratulated on weight loss.   Counseled on healthy, low carb diet and recommend frequent activity to help with maintaining good control of blood sugars.

## 2018-05-10 NOTE — Assessment & Plan Note (Signed)
Well controlled with meloxicam prn, continue.

## 2018-05-10 NOTE — Assessment & Plan Note (Signed)
Stable, followed by neurology.

## 2018-05-10 NOTE — Assessment & Plan Note (Signed)
Blood pressure is at goal at for age and co-morbidities.  I recommend he continue medications.  In addition they were instructed to follow a low sodium diet with regular exercise to help to maintain adequate control of blood pressure.

## 2018-05-19 DIAGNOSIS — H4051X3 Glaucoma secondary to other eye disorders, right eye, severe stage: Secondary | ICD-10-CM | POA: Diagnosis not present

## 2018-05-19 DIAGNOSIS — H4052X2 Glaucoma secondary to other eye disorders, left eye, moderate stage: Secondary | ICD-10-CM | POA: Diagnosis not present

## 2018-05-19 NOTE — Progress Notes (Addendum)
[facility]  PATIENT: Tanner Young DOB: 12-22-1955   REASON FOR VISIT: Follow-up for obstructive sleep apnea HISTORY FROM: Patient    HISTORY OF PRESENT ILLNESS: [name] is a 62 year old right-handed gentleman with an underlying medical history of hypertension, reflux disease, chronic low back pain, arthritis, glaucoma, and obesity, who presents for follow-up consultation of his obstructive sleep apnea, after recent sleep study testing and starting CPAP therapy. The patient is unaccompanied today. I first met him on 08/02/2017 at the request of his dentist, at which time he reported a prior diagnosis of obstructive sleep apnea but he does not using CPAP. He was invited for sleep study testing. He had a split-night sleep study, followed by a full night titration study. I went over his test results with him in detail today. Split-night sleep study from 09/15/2017 showed a baseline sleep efficiency of 92%, sleep latency was 4 minutes, REM sleep was absent. He had a elevated AHI of 70 per hour, average oxygen saturation of 96%, nadir was 73%. He was then fitted with a medium fullface mask. CPAP was started at 5 cm and advanced to 18 cm. He did not achieve any sleep on the final pressure, on a treatment pressure of 16 cm his AHI was highly elevated. Since he did not have a fully successful titration portion of the study, he was advised to return for a full night CPAP titration test separately. He had this on 10/18/2017, sleep efficiency was 90.4%, sleep latency 7.5 minutes and REM latency was 46.5 minutes. CPAP was initiated at 8 cm and titrated to 16 cm. On the final titration pressure his AHI was 0 per hour, supine REM sleep was achieved and with a nadir was 90%. He had no slow-wave sleep during the study but REM sleep was 25.4%. He had no significant PLMS. Based on his test results he was advised to start home CPAP therapy at a pressure of 16 cm. We cautioned him regarding  ongoing use of narcotic pain medication and the importance of losing weight to reduce the severity of his sleep-disordered breathing.   Today, 11/21/2017: I reviewed his CPAP compliance data from 10/19/2017 to 11/17/2017 which is a total of 30 days, during which time he used his CPAP 30 days with percent used days greater than 4 hours at 100%, indicating superb compliance with an average usage of 7 hours and 10 minutes, residual AHI at goal at 3.4 per hour, leak acceptable with the 95th percentile at 11.7 L/m on a pressure of 16 cm with EPR of 3. He reports doing better, and feeling improved, less sleepy during the day, sleeps quietly. Still has some Norco, but only as needed, not daily. Able to tolerate the pressure in the mask. Wife is very pleased with how well he is sleeping and how quiet the machine is. UPDATE 10/8/2019CM  Tanner Young, 62 year old male returns for follow-up with history of obstructive sleep apnea here for CPAP compliance.  He is doing well with his CPAP machine no issues.  His daytime drowsiness much improved.  Compliance data dated 04/22/2018-05/21/2018 shows compliance greater than 4 hours at 100%.  Average usage 6 hours 5 minutes.  Set pressure 16 cm.  EPR level 3 AHI 3.0 ESS 6 he returns for reevaluation REVIEW OF SYSTEMS: Full 14 system review of systems performed and notable only for those listed, all others are neg:  Constitutional: neg  Cardiovascular: neg Ear/Nose/Throat: neg  Skin: neg Eyes: neg Respiratory: neg Gastroitestinal: neg  Hematology/Lymphatic: neg  Endocrine: neg Musculoskeletal:neg Allergy/Immunology: neg Neurological: neg Psychiatric: neg Sleep : Obstructive sleep apnea with CPAP   ALLERGIES: Allergies  Allergen Reactions  . Shellfish Allergy Itching and Nausea And Vomiting    Shrimp     HOME MEDICATIONS: Outpatient Medications Prior to Visit  Medication Sig Dispense Refill  . amLODipine (NORVASC) 10 MG tablet Take 10 mg by mouth daily.       . benazepril (LOTENSIN) 40 MG tablet Take 40 mg by mouth daily.    . blood glucose meter kit and supplies KIT Dispense based on patient and insurance preference. Use daily as directed to monitor blood sugar. (FOR ICD-9 250.00, 250.01). 1 each 0  . brimonidine (ALPHAGAN) 0.15 % ophthalmic solution Place 1 drop into both eyes 2 (two) times daily at 10 AM and 5 PM.    . cetirizine (ZYRTEC) 10 MG tablet Take 10 mg by mouth daily.    . cholecalciferol (VITAMIN D) 1000 UNITS tablet Take 1,000 Units by mouth daily.    [name] desonide (DESOWEN) 0.05 % cream Apply topically 2 (two) times daily.    . dorzolamide-timolol (COSOPT) 22.3-6.8 MG/ML ophthalmic solution Place 1 drop into both eyes 2 (two) times daily.     [name] gabapentin (NEURONTIN) 300 MG capsule Take 300 mg by mouth at bedtime.    [name] ketoconazole (NIZORAL) 2 % cream Apply 1 application topically daily.    . metFORMIN (GLUCOPHAGE-XR) 500 MG 24 hr tablet Take 1 tablet (500 mg total) by mouth daily with breakfast. 90 tablet 1  . moxifloxacin (VIGAMOX) 0.5 % ophthalmic solution 1 drop 3 (three) times daily.    [name] omeprazole (PRILOSEC) 20 MG capsule Take 20 mg by mouth at bedtime.    [name] PRESCRIPTION MEDICATION Apply 1 application topically daily. Cream to feet    . PRESCRIPTION MEDICATION Apply 1 application topically daily. Cream to back    . RHOPRESSA 0.02 % SOLN INT 1 GTT IN OU Q NIGHT  3  . sildenafil (VIAGRA) 100 MG tablet Take 100 mg by mouth daily as needed for erectile dysfunction.    . simvastatin (ZOCOR) 20 MG tablet Take 10 mg by mouth at bedtime.    . terbinafine (LAMISIL) 1 % cream Apply 1 application topically 2 (two) times daily.    [name] triamcinolone ointment (KENALOG) 0.1 % Apply 1 application topically 2 (two) times daily.    [name] triamterene-hydrochlorothiazide (MAXZIDE) 75-50 MG per tablet Take 0.5 tablets by mouth daily.     No facility-administered medications prior to visit.     PAST MEDICAL HISTORY: Past Medical History:  Diagnosis  Date  . Arthritis   . Chronic back pain   . Chronic kidney disease   . Cough   . Diabetes mellitus type II, controlled ([place])   . Dry skin   . Floaters in visual field   . GERD (gastroesophageal reflux disease)   . Glaucoma, both eyes   . Hay fever   . Heartburn   . Hyperlipemia   . Hypertension   . Itching   . Joint pain   . Kidney disease   . Lactose intolerance   . Leg cramp   . Low back pain   . Multiple food allergies   . Muscle pain   . Neuropathy    rt leg from DDD  . Rash   . Sleep apnea    uses a cpap  . Stiff neck   . Swelling of lower extremity   . Trouble in  sleeping   . Vision changes   . Vitamin D deficiency   . Wears glasses   . Wears partial dentures    top mid partial-flipper    PAST SURGICAL HISTORY: Past Surgical History:  Procedure Laterality Date  . COLONOSCOPY    . DENTAL SURGERY     extractions  . EYE SURGERY    . ORIF PATELLA Right 01/24/2013   Procedure: OPEN REDUCTION INTERNAL (ORIF) FIXATION PATELLA;  Surgeon: Kerin Salen, MD;  Location: [facility];  Service: Orthopedics;  Laterality: Right;  orif right patella,patellectomy     FAMILY HISTORY: Family History  Problem Relation Age of Onset  . Arthritis Mother   . Hearing loss Mother   . Hyperlipidemia Mother   . Hypertension Mother   . Diabetes Mother   . Cancer Father   . Hypertension Father   . Diabetes Brother   . Hyperlipidemia Brother   . Hypertension Brother     SOCIAL HISTORY: Social History   Socioeconomic History  . Marital status: Married    Spouse name: Runner, broadcasting/film/video  . Number of children: Not on file  . Years of education: Not on file  . Highest education level: Not on file  Occupational History  . Occupation: Retired  Scientific laboratory technician  . Financial resource strain: Not on file  . Food insecurity:    Worry: Not on file    Inability: Not on file  . Transportation needs:    Medical: Not on file    Non-medical: Not on file  Tobacco Use  . Smoking  status: Never Smoker  . Smokeless tobacco: Never Used  Substance and Sexual Activity  . Alcohol use: No  . Drug use: No  . Sexual activity: Not on file  Lifestyle  . Physical activity:    Days per week: Not on file    Minutes per session: Not on file  . Stress: Not on file  Relationships  . Social connections:    Talks on phone: Not on file    Gets together: Not on file    Attends religious service: Not on file    Active member of club or organization: Not on file    Attends meetings of clubs or organizations: Not on file    Relationship status: Not on file  . Intimate partner violence:    Fear of current or ex partner: Not on file    Emotionally abused: Not on file    Physically abused: Not on file    Forced sexual activity: Not on file  Other Topics Concern  . Not on file  Social History Narrative  . Not on file     PHYSICAL EXAM  Vitals:   05/23/18 1327 05/23/18 1341  BP: 112/71   Pulse: (!) 43 (!) 44  Weight: 263 lb 6.4 oz (119.5 kg)   Height: '5\' 10"'$  (1.778 m)    Body mass index is 37.79 kg/m.  Generalized: Well developed, in no acute distress  Head: normocephalic and atraumatic,. Oropharynx benign  Neck: Supple, Musculoskeletal: No deformity   Neurological examination   Mentation: Alert oriented to time, place, history taking. Attention span and concentration appropriate. Recent and remote memory intact.  Follows all commands speech and language fluent.   Cranial nerve II-XII: Pupils were equal round reactive to light extraocular movements were full, visual field were full on confrontational test. Facial sensation and strength were normal. hearing was intact to finger rubbing bilaterally. Uvula tongue midline. head turning and shoulder  shrug were normal and symmetric.Tongue protrusion into cheek strength was normal. Motor: normal bulk and tone, full strength in the BUE, BLE,  Sensory: normal and symmetric to light touch,  Coordination: finger-nose-finger,  heel-to-shin bilaterally, no dysmetria Gait and Station: Rising up from seated position without assistance, normal stance,  moderate stride, good arm swing, smooth turning, able to perform tiptoe, and heel walking without difficulty. Tandem gait is steady  DIAGNOSTIC DATA (LABS, IMAGING, TESTING) - I reviewed patient records, labs, notes, testing and imaging myself where available.  Lab Results  Component Value Date   WBC 5.2 01/20/2018   HGB 13.0 01/20/2018   HCT 37.9 (L) 01/20/2018   MCV 88.7 01/20/2018   PLT 188.0 01/20/2018      Component Value Date/Time   NA 137 01/20/2018 1057   K 4.0 01/20/2018 1057   CL 103 01/20/2018 1057   CO2 25 01/20/2018 1057   GLUCOSE 218 (H) 01/20/2018 1057   BUN 14 01/20/2018 1057   CREATININE 1.18 01/20/2018 1057   CALCIUM 9.5 01/20/2018 1057   PROT 6.7 01/20/2018 1057   ALBUMIN 4.0 01/20/2018 1057   AST 21 01/20/2018 1057   ALT 24 01/20/2018 1057   ALKPHOS 61 01/20/2018 1057   BILITOT 0.7 01/20/2018 1057   Lab Results  Component Value Date   CHOL 162 05/08/2018   HDL 42 05/08/2018   LDLCALC 95 05/08/2018   TRIG 123 05/08/2018   Lab Results  Component Value Date   HGBA1C 6.2 (H) 05/08/2018   No results found for: VITAMINB12 Lab Results  Component Value Date   TSH 2.670 05/08/2018      ASSESSMENT AND PLAN Dannielle Huh R Watsonis a very pleasant 77 year oldmalewith an underlying medical history of hypertension, reflux disease, chronic low back pain, arthritis, glaucoma, and obesity, whopresents for follow-up consultation of his severe obstructive sleep apnea. He had difficulty with his prior CPAP. He had a split-night sleep study since then in January 2019 and a subsequent full night titration study in March 2019. He is fully compliant with his CPAP and commended for his superb treatment adherence. He also indicates good results with respect to sleep consolidation, sleep quality and daytime somnolence, his nocturia is also better. His  wife is very pleased with how he is doing and observes that he sleeps much quieter, his machine is very quiet as well.Compliance data dated 04/22/2018-05/21/2018 shows compliance greater than 4 hours at 100%.  Average usage 6 hours 5 minutes.  Set pressure 16 cm.  EPR level 3 AHI 3.0 ESS 6.  Patient has lost about 45 pounds since last seen and recently diagnosed as diabetic.   CPAP compliance 100% Continue same settings Follow-up yearly and as needed Dennie Bible, [facility], [facility], APRN  [facility] [address] [place], [place] [address] [phone]  I reviewed the above note and documentation by the Nurse Practitioner and agree with the history, physical exam, assessment and plan as outlined above. I was immediately available for face-to-face consultation. Star Age, MD, PhD [facility])

## 2018-05-23 ENCOUNTER — Encounter: Payer: Self-pay | Admitting: Nurse Practitioner

## 2018-05-23 ENCOUNTER — Ambulatory Visit (INDEPENDENT_AMBULATORY_CARE_PROVIDER_SITE_OTHER): Payer: Medicare Other | Admitting: Nurse Practitioner

## 2018-05-23 ENCOUNTER — Ambulatory Visit (INDEPENDENT_AMBULATORY_CARE_PROVIDER_SITE_OTHER): Payer: Medicare Other | Admitting: Bariatrics

## 2018-05-23 VITALS — BP 104/65 | HR 45 | Temp 98.2°F | Ht 69.0 in | Wt 257.0 lb

## 2018-05-23 VITALS — BP 112/71 | HR 44 | Ht 70.0 in | Wt 263.4 lb

## 2018-05-23 DIAGNOSIS — G4733 Obstructive sleep apnea (adult) (pediatric): Secondary | ICD-10-CM | POA: Diagnosis not present

## 2018-05-23 DIAGNOSIS — E119 Type 2 diabetes mellitus without complications: Secondary | ICD-10-CM

## 2018-05-23 DIAGNOSIS — Z9989 Dependence on other enabling machines and devices: Secondary | ICD-10-CM | POA: Diagnosis not present

## 2018-05-23 DIAGNOSIS — I10 Essential (primary) hypertension: Secondary | ICD-10-CM | POA: Diagnosis not present

## 2018-05-23 DIAGNOSIS — Z6838 Body mass index (BMI) 38.0-38.9, adult: Secondary | ICD-10-CM | POA: Diagnosis not present

## 2018-05-23 NOTE — Patient Instructions (Signed)
CPAP compliance 100% Continue same settings Follow-up yearly and as needed  

## 2018-05-24 NOTE — Progress Notes (Signed)
Office: [phone]  /  Fax: [phone]   HPI:   Chief Complaint: OBESITY Tanner Young is here to discuss his progress with his obesity treatment plan. He is on the Category 3 plan and is following his eating plan approximately 100 % of the time. He states he is walking 20 to 30 minutes 3 times per week. Tanner Young had "no significant struggles' and he was able to eat most of the food on the Category 3 plan. Hunger was controlled and he had no significant cravings. His weight is 257 lb (116.6 kg) today and has had a weight loss of 6 pounds over a period of 2 weeks since his last visit. He has lost 6 lbs since starting treatment with [name].  Diabetes II Tanner Young has a diagnosis of diabetes type II. He is taking metformin without side effects. Alf states fasting BGs range between 90 and 116 and 2 hour post prandial BGs range between 99 and 118. He denies any hypoglycemic episodes. Last A1c was at 6.2 and insulin was at 19.3. He has been working on intensive lifestyle modifications including diet, exercise, and weight loss to help control his blood glucose levels.  Hypertension [name] is a 62 y.o. male with hypertension. He is taking amlodipine and maxzide. He has decreased pulse and denies any weakness or fatigue. [name] is working weight loss to help control his blood pressure with the goal of decreasing his risk of heart attack and stroke. [name] blood pressure is well controlled.  ALLERGIES: Allergies  Allergen Reactions  . Shellfish Allergy Itching and Nausea And Vomiting    Shrimp     MEDICATIONS: Current Outpatient Medications on File Prior to Visit  Medication Sig Dispense Refill  . amLODipine (NORVASC) 10 MG tablet Take 10 mg by mouth daily.     . benazepril (LOTENSIN) 40 MG tablet Take 40 mg by mouth daily.    . blood glucose meter kit and supplies KIT Dispense based on patient and insurance preference. Use daily as directed to monitor blood sugar. (FOR ICD-9 250.00,  250.01). 1 each 0  . brimonidine (ALPHAGAN) 0.15 % ophthalmic solution Place 1 drop into both eyes 2 (two) times daily at 10 AM and 5 PM.    . cetirizine (ZYRTEC) 10 MG tablet Take 10 mg by mouth daily.    . cholecalciferol (VITAMIN D) 1000 UNITS tablet Take 1,000 Units by mouth daily.    [name] desonide (DESOWEN) 0.05 % cream Apply topically 2 (two) times daily.    . dorzolamide-timolol (COSOPT) 22.3-6.8 MG/ML ophthalmic solution Place 1 drop into both eyes 2 (two) times daily.     [name] gabapentin (NEURONTIN) 300 MG capsule Take 300 mg by mouth at bedtime.    [name] ketoconazole (NIZORAL) 2 % cream Apply 1 application topically daily.    . metFORMIN (GLUCOPHAGE-XR) 500 MG 24 hr tablet Take 1 tablet (500 mg total) by mouth daily with breakfast. 90 tablet 1  . moxifloxacin (VIGAMOX) 0.5 % ophthalmic solution 1 drop 3 (three) times daily.    [name] omeprazole (PRILOSEC) 20 MG capsule Take 20 mg by mouth at bedtime.    [name] PRESCRIPTION MEDICATION Apply 1 application topically daily. Cream to feet    . PRESCRIPTION MEDICATION Apply 1 application topically daily. Cream to back    . RHOPRESSA 0.02 % SOLN INT 1 GTT IN OU Q NIGHT  3  . sildenafil (VIAGRA) 100 MG tablet Take 100 mg by mouth daily as needed for erectile dysfunction.    [name]  simvastatin (ZOCOR) 20 MG tablet Take 10 mg by mouth at bedtime.    . terbinafine (LAMISIL) 1 % cream Apply 1 application topically 2 (two) times daily.    [name] triamcinolone ointment (KENALOG) 0.1 % Apply 1 application topically 2 (two) times daily.    [name] triamterene-hydrochlorothiazide (MAXZIDE) 75-50 MG per tablet Take 0.5 tablets by mouth daily.     No current facility-administered medications on file prior to visit.     PAST MEDICAL HISTORY: Past Medical History:  Diagnosis Date  . Arthritis   . Chronic back pain   . Chronic kidney disease   . Cough   . Diabetes mellitus type II, controlled ([place])   . Dry skin   . Floaters in visual field   . GERD (gastroesophageal reflux  disease)   . Glaucoma, both eyes   . Hay fever   . Heartburn   . Hyperlipemia   . Hypertension   . Itching   . Joint pain   . Kidney disease   . Lactose intolerance   . Leg cramp   . Low back pain   . Multiple food allergies   . Muscle pain   . Neuropathy    rt leg from DDD  . Rash   . Sleep apnea    uses a cpap  . Stiff neck   . Swelling of lower extremity   . Trouble in sleeping   . Vision changes   . Vitamin D deficiency   . Wears glasses   . Wears partial dentures    top mid partial-flipper    PAST SURGICAL HISTORY: Past Surgical History:  Procedure Laterality Date  . COLONOSCOPY    . DENTAL SURGERY     extractions  . EYE SURGERY    . ORIF PATELLA Right 01/24/2013   Procedure: OPEN REDUCTION INTERNAL (ORIF) FIXATION PATELLA;  Surgeon: Kerin Salen, MD;  Location: [facility];  Service: Orthopedics;  Laterality: Right;  orif right patella,patellectomy     SOCIAL HISTORY: Social History   Tobacco Use  . Smoking status: Never Smoker  . Smokeless tobacco: Never Used  Substance Use Topics  . Alcohol use: No  . Drug use: No    FAMILY HISTORY: Family History  Problem Relation Age of Onset  . Arthritis Mother   . Hearing loss Mother   . Hyperlipidemia Mother   . Hypertension Mother   . Diabetes Mother   . Cancer Father   . Hypertension Father   . Diabetes Brother   . Hyperlipidemia Brother   . Hypertension Brother     ROS: Review of Systems  Constitutional: Positive for weight loss. Negative for malaise/fatigue.  Neurological: Negative for weakness.  Endo/Heme/Allergies:       Negative for hypoglyemia    PHYSICAL EXAM: Blood pressure 104/65, pulse (!) 45, temperature 98.2 F (36.8 C), temperature source Oral, height '5\' 9"'$  (1.753 m), weight 257 lb (116.6 kg), SpO2 99 %. Body mass index is 37.95 kg/m. Physical Exam  Constitutional: He is oriented to person, place, and time. He appears well-developed and well-nourished.    Cardiovascular: Normal rate.  Pulmonary/Chest: Effort normal.  Musculoskeletal: Normal range of motion.  Neurological: He is oriented to person, place, and time.  Skin: Skin is warm and dry.  Psychiatric: He has a normal mood and affect. His behavior is normal.  Vitals reviewed.   RECENT LABS AND TESTS: BMET    Component Value Date/Time   NA 137 01/20/2018 1057   K  4.0 01/20/2018 1057   CL 103 01/20/2018 1057   CO2 25 01/20/2018 1057   GLUCOSE 218 (H) 01/20/2018 1057   BUN 14 01/20/2018 1057   CREATININE 1.18 01/20/2018 1057   CALCIUM 9.5 01/20/2018 1057   Lab Results  Component Value Date   HGBA1C 6.2 (H) 05/08/2018   HGBA1C 7.6 (H) 01/27/2018   Lab Results  Component Value Date   INSULIN 19.3 05/08/2018   CBC    Component Value Date/Time   WBC 5.2 01/20/2018 1057   RBC 4.28 01/20/2018 1057   HGB 13.0 01/20/2018 1057   HCT 37.9 (L) 01/20/2018 1057   PLT 188.0 01/20/2018 1057   MCV 88.7 01/20/2018 1057   MCHC 34.4 01/20/2018 1057   RDW 13.6 01/20/2018 1057   Iron/TIBC/Ferritin/ %Sat No results found for: IRON, TIBC, FERRITIN, IRONPCTSAT Lipid Panel     Component Value Date/Time   CHOL 162 05/08/2018 1043   TRIG 123 05/08/2018 1043   HDL 42 05/08/2018 1043   LDLCALC 95 05/08/2018 1043   Hepatic Function Panel     Component Value Date/Time   PROT 6.7 01/20/2018 1057   ALBUMIN 4.0 01/20/2018 1057   AST 21 01/20/2018 1057   ALT 24 01/20/2018 1057   ALKPHOS 61 01/20/2018 1057   BILITOT 0.7 01/20/2018 1057      Component Value Date/Time   TSH 2.670 05/08/2018 1043   TSH 1.75 01/20/2018 1057   Results for IZIK, BINGMAN (MRN [id number]) as of 05/24/2018 15:28  Ref. Range 05/08/2018 10:43  Vitamin D, 25-Hydroxy Latest Ref Range: 30.0 - 100.0 ng/mL 45.5   ASSESSMENT AND PLAN: Type 2 diabetes mellitus without complication, without long-term current use of insulin (HCC)  Essential hypertension  Class 2 severe obesity with serious comorbidity and body  mass index (BMI) of 38.0 to 38.9 in adult, unspecified obesity type ([place])  PLAN:  Diabetes II Torrey has been given extensive diabetes education by myself today including ideal fasting and post-prandial blood glucose readings, individual ideal Hgb A1c goals and hypoglycemia prevention. We discussed the importance of good blood sugar control to decrease the likelihood of diabetic complications such as nephropathy, neuropathy, limb loss, blindness, coronary artery disease, and death. We discussed the importance of intensive lifestyle modification including diet, exercise and weight loss as the first line treatment for diabetes. Affan agrees to continue metformin at the current dose and  follow up at the agreed upon time.  Hypertension We discussed sodium restriction, working on healthy weight loss, and a regular exercise program as the means to achieve improved blood pressure control. Alejandro agreed with this plan and agreed to follow up as directed. We will continue to monitor his blood pressure as well as his progress with the above lifestyle modifications. He will continue his medications as prescribed and will watch for signs of hypotension as he continues his lifestyle modifications.  Obesity Jeffry is currently in the action stage of change. As such, his goal is to continue with weight loss efforts He has agreed to follow the Category 3 plan Anthoney has been instructed to work up to a goal of 150 minutes of combined cardio and strengthening exercise per week for weight loss and overall health benefits. We discussed the following Behavioral Modification Strategies today: increase H2O intake, no skipping meals, increasing lean protein intake and increasing vegetables  We discussed variety and other eating choices today.  Clavin has agreed to follow up with our clinic in 2 weeks. He was informed of the importance  of frequent follow up visits to maximize his success with intensive lifestyle  modifications for his multiple health conditions.   OBESITY BEHAVIORAL INTERVENTION VISIT  Today's visit was # 2   Starting weight: 263 lbs Starting date: 05/08/18 Today's weight : 257 lbs Today's date: 05/23/2018 Total lbs lost to date: 6 At least 15 minutes were spent on discussing the following behavioral intervention visit.   ASK: We discussed the diagnosis of obesity with [name] today and Tripton agreed to give [name] permission to discuss obesity behavioral modification therapy today.  ASSESS: Arsalan has the diagnosis of obesity and his BMI today is 37.79 Keneth is in the action stage of change   ADVISE: Zac was educated on the multiple health risks of obesity as well as the benefit of weight loss to improve his health. He was advised of the need for long term treatment and the importance of lifestyle modifications to improve his current health and to decrease his risk of future health problems.  AGREE: Multiple dietary modification options and treatment options were discussed and  Ponciano agreed to follow the recommendations documented in the above note.  ARRANGE: Erric was educated on the importance of frequent visits to treat obesity as outlined per CMS and USPSTF guidelines and agreed to schedule his next follow up appointment today.  Corey Skains, am acting as Location manager for [employer]. Owens Shark, DO  I have reviewed the above documentation for accuracy and completeness, and I agree with the above. -Jearld Lesch, DO

## 2018-06-06 ENCOUNTER — Encounter (INDEPENDENT_AMBULATORY_CARE_PROVIDER_SITE_OTHER): Payer: Self-pay | Admitting: Physician Assistant

## 2018-06-06 ENCOUNTER — Ambulatory Visit (INDEPENDENT_AMBULATORY_CARE_PROVIDER_SITE_OTHER): Payer: Medicare Other | Admitting: Physician Assistant

## 2018-06-06 VITALS — BP 101/65 | HR 52 | Temp 97.8°F | Ht 69.0 in | Wt 255.0 lb

## 2018-06-06 DIAGNOSIS — E559 Vitamin D deficiency, unspecified: Secondary | ICD-10-CM

## 2018-06-06 DIAGNOSIS — Z6837 Body mass index (BMI) 37.0-37.9, adult: Secondary | ICD-10-CM | POA: Diagnosis not present

## 2018-06-06 DIAGNOSIS — E119 Type 2 diabetes mellitus without complications: Secondary | ICD-10-CM | POA: Diagnosis not present

## 2018-06-06 MED ORDER — VITAMIN D (ERGOCALCIFEROL) 50 MCG (2000 UT) PO CAPS: 1.0000 | ORAL_CAPSULE | Freq: Every day | ORAL | 0 refills | Status: DC

## 2018-06-07 NOTE — Progress Notes (Signed)
Office: [phone]  /  Fax: [phone]   HPI:   Chief Complaint: OBESITY Tanner Young is here to discuss his progress with his obesity treatment plan. He is on the Category 3 plan and is following his eating plan approximately 95 % of the time. He states he is walking for 20 minutes 2 times per week. [name] did well with weight loss. He reports struggling to get in all of the protein at lunch, and he is adding some at breakfast to make up for it. 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 His weight is 255 lb (115.7 kg) today and has had a weight loss of 2 pounds over a period of 2 weeks since his last visit. He has lost 8 lbs since starting treatment with [name].  Vitamin D Deficiency Tanner Young has a diagnosis of vitamin D deficiency. He is on prescription Vit D and denies nausea, vomiting or muscle weakness.  Diabetes II Tanner Young has a diagnosis of diabetes type II. Parrish states fasting BGs range between 93 and 114, and he denies hypoglycemia. Last A1c was 6.2. He has been working on intensive lifestyle modifications including diet, exercise, and weight loss to help control his blood glucose levels.  ALLERGIES: Allergies  Allergen Reactions  . Shellfish Allergy Itching and Nausea And Vomiting    Shrimp     MEDICATIONS: Current Outpatient Medications on File Prior to Visit  Medication Sig Dispense Refill  . amLODipine (NORVASC) 10 MG tablet Take 10 mg by mouth daily.     . benazepril (LOTENSIN) 40 MG tablet Take 40 mg by mouth daily.    . blood glucose meter kit and supplies KIT Dispense based on patient and insurance preference. Use daily as directed to monitor blood sugar. (FOR ICD-9 250.00, 250.01). 1 each 0  . brimonidine (ALPHAGAN) 0.15 % ophthalmic solution Place 1 drop into both eyes 2 (two) times daily at 10 AM and 5 PM.    . cetirizine (ZYRTEC) 10 MG tablet Take 10 mg by mouth daily.    . cholecalciferol (VITAMIN D) 1000 UNITS tablet Take 1,000 Units by mouth daily.    [name] desonide  (DESOWEN) 0.05 % cream Apply topically 2 (two) times daily.    . dorzolamide-timolol (COSOPT) 22.3-6.8 MG/ML ophthalmic solution Place 1 drop into both eyes 2 (two) times daily.     [name] gabapentin (NEURONTIN) 300 MG capsule Take 300 mg by mouth at bedtime.    [name] ketoconazole (NIZORAL) 2 % cream Apply 1 application topically daily.    . metFORMIN (GLUCOPHAGE-XR) 500 MG 24 hr tablet Take 1 tablet (500 mg total) by mouth daily with breakfast. 90 tablet 1  . moxifloxacin (VIGAMOX) 0.5 % ophthalmic solution 1 drop 3 (three) times daily.    [name] omeprazole (PRILOSEC) 20 MG capsule Take 20 mg by mouth at bedtime.    [name] PRESCRIPTION MEDICATION Apply 1 application topically daily. Cream to feet    . PRESCRIPTION MEDICATION Apply 1 application topically daily. Cream to back    . RHOPRESSA 0.02 % SOLN INT 1 GTT IN OU Q NIGHT  3  . sildenafil (VIAGRA) 100 MG tablet Take 100 mg by mouth daily as needed for erectile dysfunction.    . simvastatin (ZOCOR) 20 MG tablet Take 10 mg by mouth at bedtime.    . terbinafine (LAMISIL) 1 % cream Apply 1 application topically 2 (two) times daily.    [name] triamcinolone ointment (KENALOG) 0.1 % Apply 1 application topically 2 (two) times daily.    [name]  triamterene-hydrochlorothiazide (MAXZIDE) 75-50 MG per tablet Take 0.5 tablets by mouth daily.     No current facility-administered medications on file prior to visit.     PAST MEDICAL HISTORY: Past Medical History:  Diagnosis Date  . Arthritis   . Chronic back pain   . Chronic kidney disease   . Cough   . Diabetes mellitus type II, controlled ([place])   . Dry skin   . Floaters in visual field   . GERD (gastroesophageal reflux disease)   . Glaucoma, both eyes   . Hay fever   . Heartburn   . Hyperlipemia   . Hypertension   . Itching   . Joint pain   . Kidney disease   . Lactose intolerance   . Leg cramp   . Low back pain   . Multiple food allergies   . Muscle pain   . Neuropathy    rt leg from DDD  . Rash   . Sleep  apnea    uses a cpap  . Stiff neck   . Swelling of lower extremity   . Trouble in sleeping   . Vision changes   . Vitamin D deficiency   . Wears glasses   . Wears partial dentures    top mid partial-flipper    PAST SURGICAL HISTORY: Past Surgical History:  Procedure Laterality Date  . COLONOSCOPY    . DENTAL SURGERY     extractions  . EYE SURGERY    . ORIF PATELLA Right 01/24/2013   Procedure: OPEN REDUCTION INTERNAL (ORIF) FIXATION PATELLA;  Surgeon: Kerin Salen, MD;  Location: [place];  Service: Orthopedics;  Laterality: Right;  orif right patella,patellectomy     SOCIAL HISTORY: Social History   Tobacco Use  . Smoking status: Never Smoker  . Smokeless tobacco: Never Used  Substance Use Topics  . Alcohol use: No  . Drug use: No    FAMILY HISTORY: Family History  Problem Relation Age of Onset  . Arthritis Mother   . Hearing loss Mother   . Hyperlipidemia Mother   . Hypertension Mother   . Diabetes Mother   . Cancer Father   . Hypertension Father   . Diabetes Brother   . Hyperlipidemia Brother   . Hypertension Brother     ROS: Review of Systems  Constitutional: Positive for weight loss.  Gastrointestinal: Negative for diarrhea, nausea and vomiting.  Musculoskeletal:       Negative muscle weakness  Endo/Heme/Allergies:       Negative hypoglycemia    PHYSICAL EXAM: Blood pressure 101/65, pulse (!) 52, temperature 97.8 F (36.6 C), temperature source Oral, height '5\' 9"'$  (1.753 m), weight 255 lb (115.7 kg), SpO2 98 %. Body mass index is 37.66 kg/m. Physical Exam  Constitutional: He is oriented to person, place, and time. He appears well-developed and well-nourished.  Cardiovascular: Normal rate.  Pulmonary/Chest: Effort normal.  Musculoskeletal: Normal range of motion.  Neurological: He is oriented to person, place, and time.  Skin: Skin is warm and dry.  Psychiatric: He has a normal mood and affect. His behavior is normal.  Vitals  reviewed.   RECENT LABS AND TESTS: BMET    Component Value Date/Time   NA 137 01/20/2018 1057   K 4.0 01/20/2018 1057   CL 103 01/20/2018 1057   CO2 25 01/20/2018 1057   GLUCOSE 218 (H) 01/20/2018 1057   BUN 14 01/20/2018 1057   CREATININE 1.18 01/20/2018 1057   CALCIUM 9.5 01/20/2018 1057  Lab Results  Component Value Date   HGBA1C 6.2 (H) 05/08/2018   HGBA1C 7.6 (H) 01/27/2018   Lab Results  Component Value Date   INSULIN 19.3 05/08/2018   CBC    Component Value Date/Time   WBC 5.2 01/20/2018 1057   RBC 4.28 01/20/2018 1057   HGB 13.0 01/20/2018 1057   HCT 37.9 (L) 01/20/2018 1057   PLT 188.0 01/20/2018 1057   MCV 88.7 01/20/2018 1057   MCHC 34.4 01/20/2018 1057   RDW 13.6 01/20/2018 1057   Iron/TIBC/Ferritin/ %Sat No results found for: IRON, TIBC, FERRITIN, IRONPCTSAT Lipid Panel     Component Value Date/Time   CHOL 162 05/08/2018 1043   TRIG 123 05/08/2018 1043   HDL 42 05/08/2018 1043   LDLCALC 95 05/08/2018 1043   Hepatic Function Panel     Component Value Date/Time   PROT 6.7 01/20/2018 1057   ALBUMIN 4.0 01/20/2018 1057   AST 21 01/20/2018 1057   ALT 24 01/20/2018 1057   ALKPHOS 61 01/20/2018 1057   BILITOT 0.7 01/20/2018 1057      Component Value Date/Time   TSH 2.670 05/08/2018 1043   TSH 1.75 01/20/2018 1057  Results for KOHLER, PELLERITO (MRN [id number]) as of 06/07/2018 14:42  Ref. Range 05/08/2018 10:43  Vitamin D, 25-Hydroxy Latest Ref Range: 30.0 - 100.0 ng/mL 45.5    ASSESSMENT AND PLAN: Vitamin D deficiency - Plan: Vitamin D, Ergocalciferol, 2000 units CAPS  Type 2 diabetes mellitus without complication, without long-term current use of insulin (HCC)  Class 2 severe obesity with serious comorbidity and body mass index (BMI) of 37.0 to 37.9 in adult, unspecified obesity type ([place])  PLAN:  Vitamin D Deficiency Landin was informed that low vitamin D levels contributes to fatigue and are associated with obesity, breast, and colon  cancer. Kamauri agrees to increase prescription Vit D to 2,000 IU every day #30 and we will refill for 1 month. He and will follow up for routine testing of vitamin D, at least 2-3 times per year. He was informed of the risk of over-replacement of vitamin D and agrees to not increase his dose unless he discusses this with [name] first. Dannielle Huh agrees to follow up with our clinic in 2 weeks.  Diabetes II Halbert has been given extensive diabetes education by myself today including ideal fasting and post-prandial blood glucose readings, individual ideal Hgb A1c goals and hypoglycemia prevention. We discussed the importance of good blood sugar control to decrease the likelihood of diabetic complications such as nephropathy, neuropathy, limb loss, blindness, coronary artery disease, and death. We discussed the importance of intensive lifestyle modification including diet, exercise and weight loss as the first line treatment for diabetes. Pate agrees to continue taking metformin, diet, and weight loss. Chaze agrees to follow up with our clinic in 2 weeks.  Obesity Jaylenn is currently in the action stage of change. As such, his goal is to continue with weight loss efforts He has agreed to follow the Category 3 plan Alyjah has been instructed to work up to a goal of 150 minutes of combined cardio and strengthening exercise per week for weight loss and overall health benefits. We discussed the following Behavioral Modification Strategies today: work on meal planning and easy cooking plans and ways to avoid boredom eating   Majestic has agreed to follow up with our clinic in 2 weeks. He was informed of the importance of frequent follow up visits to maximize his success with intensive lifestyle modifications for his  multiple health conditions.   OBESITY BEHAVIORAL INTERVENTION VISIT  Today's visit was # 3   Starting weight: 263 lbs Starting date: 05/08/18 Today's weight : 255 lbs Today's date:  06/06/2018 Total lbs lost to date: 8 At least 15 minutes were spent on discussing the following behavioral intervention visit.   ASK: We discussed the diagnosis of obesity with [name] today and Rafi agreed to give [name] permission to discuss obesity behavioral modification therapy today.  ASSESS: Cari has the diagnosis of obesity and his BMI today is 37.64 Garyson is in the action stage of change   ADVISE: Bryam was educated on the multiple health risks of obesity as well as the benefit of weight loss to improve his health. He was advised of the need for long term treatment and the importance of lifestyle modifications.  AGREE: Multiple dietary modification options and treatment options were discussed and  Jacque agreed to the above obesity treatment plan.  Wilhemena Durie, am acting as transcriptionist for Abby Potash, PA-C I, Abby Potash, PA-C have reviewed above note and agree with its content

## 2018-06-20 ENCOUNTER — Ambulatory Visit (INDEPENDENT_AMBULATORY_CARE_PROVIDER_SITE_OTHER): Payer: Medicare Other | Admitting: Physician Assistant

## 2018-06-20 ENCOUNTER — Encounter (INDEPENDENT_AMBULATORY_CARE_PROVIDER_SITE_OTHER): Payer: Self-pay | Admitting: Physician Assistant

## 2018-06-20 VITALS — BP 121/74 | HR 52 | Temp 97.9°F | Ht 69.0 in | Wt 249.0 lb

## 2018-06-20 DIAGNOSIS — Z6836 Body mass index (BMI) 36.0-36.9, adult: Secondary | ICD-10-CM

## 2018-06-20 DIAGNOSIS — E119 Type 2 diabetes mellitus without complications: Secondary | ICD-10-CM

## 2018-06-20 NOTE — Progress Notes (Signed)
Office: [phone]  /  Fax: [phone]   HPI:   Chief Complaint: OBESITY Tanner Young is here to discuss his progress with his obesity treatment plan. He is on the Category 3 plan and is following his eating plan approximately 80 % of the time. He states he is exercising 0 minutes 0 times per week. Tanner Young did very well with weight loss. He reports that he continues to struggle to get all of his snack calories in.  His weight is 249 lb (112.9 kg) today and has had a weight loss of 6 pounds over a period of 2 weeks since his last visit. He has lost 14 lbs since starting treatment with Tanner Young.  Diabetes II Tanner Young has a diagnosis of diabetes type II. Tanner Young states fasting BGs range between 86 and 106. He is on metformin and denies nausea, vomiting, or diarrhea. He denies hypoglycemia or polyphagia. Last A1c was 6.2. He has been working on intensive lifestyle modifications including diet, exercise, and weight loss to help control his blood glucose levels.  ALLERGIES: Allergies  Allergen Reactions  . Shellfish Allergy Itching and Nausea And Vomiting    Shrimp     MEDICATIONS: Current Outpatient Medications on File Prior to Visit  Medication Sig Dispense Refill  . amLODipine (NORVASC) 10 MG tablet Take 10 mg by mouth daily.     . benazepril (LOTENSIN) 40 MG tablet Take 40 mg by mouth daily.    . blood glucose meter kit and supplies KIT Dispense based on patient and insurance preference. Use daily as directed to monitor blood sugar. (FOR ICD-9 250.00, 250.01). 1 each 0  . brimonidine (ALPHAGAN) 0.15 % ophthalmic solution Place 1 drop into both eyes 2 (two) times daily at 10 AM and 5 PM.    . cetirizine (ZYRTEC) 10 MG tablet Take 10 mg by mouth daily.    . cholecalciferol (VITAMIN D) 1000 UNITS tablet Take 1,000 Units by mouth daily.    Tanner Young Kitchen desonide (DESOWEN) 0.05 % cream Apply topically 2 (two) times daily.    . dorzolamide-timolol (COSOPT) 22.3-6.8 MG/ML ophthalmic solution Place 1 drop into both  eyes 2 (two) times daily.     Tanner Young Kitchen gabapentin (NEURONTIN) 300 MG capsule Take 300 mg by mouth at bedtime.    Tanner Young Kitchen ketoconazole (NIZORAL) 2 % cream Apply 1 application topically daily.    . metFORMIN (GLUCOPHAGE-XR) 500 MG 24 hr tablet Take 1 tablet (500 mg total) by mouth daily with breakfast. 90 tablet 1  . moxifloxacin (VIGAMOX) 0.5 % ophthalmic solution 1 drop 3 (three) times daily.    Tanner Young Kitchen omeprazole (PRILOSEC) 20 MG capsule Take 20 mg by mouth at bedtime.    Tanner Young Kitchen PRESCRIPTION MEDICATION Apply 1 application topically daily. Cream to feet    . PRESCRIPTION MEDICATION Apply 1 application topically daily. Cream to back    . RHOPRESSA 0.02 % SOLN INT 1 GTT IN OU Q NIGHT  3  . sildenafil (VIAGRA) 100 MG tablet Take 100 mg by mouth daily as needed for erectile dysfunction.    . simvastatin (ZOCOR) 20 MG tablet Take 10 mg by mouth at bedtime.    . terbinafine (LAMISIL) 1 % cream Apply 1 application topically 2 (two) times daily.    Tanner Young Kitchen triamcinolone ointment (KENALOG) 0.1 % Apply 1 application topically 2 (two) times daily.    Tanner Young Kitchen triamterene-hydrochlorothiazide (MAXZIDE) 75-50 MG per tablet Take 0.5 tablets by mouth daily.    . Vitamin D, Ergocalciferol, 2000 units CAPS Take 1 tablet by mouth daily. Tanner Young  capsule 0   No current facility-administered medications on file prior to visit.     PAST MEDICAL HISTORY: Past Medical History:  Diagnosis Date  . Arthritis   . Chronic back pain   . Chronic kidney disease   . Cough   . Diabetes mellitus type II, controlled ([place])   . Dry skin   . Floaters in visual field   . GERD (gastroesophageal reflux disease)   . Glaucoma, both eyes   . Hay fever   . Heartburn   . Hyperlipemia   . Hypertension   . Itching   . Joint pain   . Kidney disease   . Lactose intolerance   . Leg cramp   . Low back pain   . Multiple food allergies   . Muscle pain   . Neuropathy    rt leg from DDD  . Rash   . Sleep apnea    uses a cpap  . Stiff neck   . Swelling of lower  extremity   . Trouble in sleeping   . Vision changes   . Vitamin D deficiency   . Wears glasses   . Wears partial dentures    top mid partial-flipper    PAST SURGICAL HISTORY: Past Surgical History:  Procedure Laterality Date  . COLONOSCOPY    . DENTAL SURGERY     extractions  . EYE SURGERY    . ORIF PATELLA Right 01/24/2013   Procedure: OPEN REDUCTION INTERNAL (ORIF) FIXATION PATELLA;  Surgeon: Kerin Salen, MD;  Location: [facility];  Service: Orthopedics;  Laterality: Right;  orif right patella,patellectomy     SOCIAL HISTORY: Social History   Tobacco Use  . Smoking status: Never Smoker  . Smokeless tobacco: Never Used  Substance Use Topics  . Alcohol use: No  . Drug use: No    FAMILY HISTORY: Family History  Problem Relation Age of Onset  . Arthritis Mother   . Hearing loss Mother   . Hyperlipidemia Mother   . Hypertension Mother   . Diabetes Mother   . Cancer Father   . Hypertension Father   . Diabetes Brother   . Hyperlipidemia Brother   . Hypertension Brother     ROS: Review of Systems  Constitutional: Positive for weight loss.  Gastrointestinal: Negative for diarrhea, nausea and vomiting.  Endo/Heme/Allergies:       Negative hypoglycemia Negative polyphagia    PHYSICAL EXAM: Blood pressure 121/74, pulse (!) 52, temperature 97.9 F (36.6 C), temperature source Oral, height _0  (1.753 m), weight 249 lb (112.9 kg), SpO2 100 %. Body mass index is 36.77 kg/m. Physical Exam  Constitutional: He is oriented to person, place, and time. He appears well-developed and well-nourished.  Cardiovascular: Normal rate.  Pulmonary/Chest: Effort normal.  Musculoskeletal: Normal range of motion.  Neurological: He is oriented to person, place, and time.  Skin: Skin is warm and dry.  Psychiatric: He has a normal mood and affect. His behavior is normal.  Vitals reviewed.   RECENT LABS AND TESTS: BMET    Component Value Date/Time   NA 137  01/20/2018 1057   K 4.0 01/20/2018 1057   CL 103 01/20/2018 1057   CO2 25 01/20/2018 1057   GLUCOSE 218 (H) 01/20/2018 1057   BUN 14 01/20/2018 1057   CREATININE 1.18 01/20/2018 1057   CALCIUM 9.5 01/20/2018 1057   Lab Results  Component Value Date   HGBA1C 6.2 (H) 05/08/2018   HGBA1C 7.6 (H) 01/27/2018  Lab Results  Component Value Date   INSULIN 19.3 05/08/2018   CBC    Component Value Date/Time   WBC 5.2 01/20/2018 1057   RBC 4.28 01/20/2018 1057   HGB 13.0 01/20/2018 1057   HCT 37.9 (L) 01/20/2018 1057   PLT 188.0 01/20/2018 1057   MCV 88.7 01/20/2018 1057   MCHC 34.4 01/20/2018 1057   RDW 13.6 01/20/2018 1057   Iron/TIBC/Ferritin/ %Sat No results found for: IRON, TIBC, FERRITIN, IRONPCTSAT Lipid Panel     Component Value Date/Time   CHOL 162 05/08/2018 1043   TRIG 123 05/08/2018 1043   HDL 42 05/08/2018 1043   LDLCALC 95 05/08/2018 1043   Hepatic Function Panel     Component Value Date/Time   PROT 6.7 01/20/2018 1057   ALBUMIN 4.0 01/20/2018 1057   AST 21 01/20/2018 1057   ALT 24 01/20/2018 1057   ALKPHOS 61 01/20/2018 1057   BILITOT 0.7 01/20/2018 1057      Component Value Date/Time   TSH 2.670 05/08/2018 1043   TSH 1.75 01/20/2018 1057    ASSESSMENT AND PLAN: Type 2 diabetes mellitus without complication, without long-term current use of insulin (HCC)  Class 2 severe obesity with serious comorbidity and body mass index (BMI) of 36.0 to 36.9 in adult, unspecified obesity type ([place])  PLAN:  Diabetes II Tanner Young has been given extensive diabetes education by myself today including ideal fasting and post-prandial blood glucose readings, individual ideal Hgb A1c goals and hypoglycemia prevention. We discussed the importance of good blood sugar control to decrease the likelihood of diabetic complications such as nephropathy, neuropathy, limb loss, blindness, coronary artery disease, and death. We discussed the importance of intensive lifestyle  modification including diet, exercise and weight loss as the first line treatment for diabetes. Tanner Young agrees to continue his diabetes medications, diet, and weight loss. Tanner Young agrees to follow up with our clinic in 3 weeks.  Obesity Tanner Young is currently in the action stage of change. As such, his goal is to continue with weight loss efforts He has agreed to follow the Category 3 plan Tanner Young has been instructed to work up to a goal of 150 minutes of combined cardio and strengthening exercise per week for weight loss and overall health benefits. We discussed the following Behavioral Modification Strategies today: work on meal planning and easy cooking plans and keeping healthy foods in the home   Tanner Young has agreed to follow up with our clinic in 3 weeks. He was informed of the importance of frequent follow up visits to maximize his success with intensive lifestyle modifications for his multiple health conditions.   OBESITY BEHAVIORAL INTERVENTION VISIT  Today's visit was # 4   Starting weight: 263 lbs Starting date: 05/08/18 Today's weight : 249 lbs Today's date: 06/20/2018 Total lbs lost to date: 32    ASK: We discussed the diagnosis of obesity with Tanner Young today and Tanner Young agreed to give Tanner Young permission to discuss obesity behavioral modification therapy today.  ASSESS: Rubens has the diagnosis of obesity and his BMI today is 36.75 Dreyden is in the action stage of change   ADVISE: Richie was educated on the multiple health risks of obesity as well as the benefit of weight loss to improve his health. He was advised of the need for long term treatment and the importance of lifestyle modifications.  AGREE: Multiple dietary modification options and treatment options were discussed and  Birl agreed to the above obesity treatment plan.  Wilhemena Durie, am  acting as transcriptionist for Abby Potash, PA-C I, Abby Potash, PA-C have reviewed above note and agree with its  content

## 2018-07-11 ENCOUNTER — Encounter (INDEPENDENT_AMBULATORY_CARE_PROVIDER_SITE_OTHER): Payer: Self-pay | Admitting: Physician Assistant

## 2018-07-11 ENCOUNTER — Ambulatory Visit (INDEPENDENT_AMBULATORY_CARE_PROVIDER_SITE_OTHER): Payer: Medicare Other | Admitting: Physician Assistant

## 2018-07-11 VITALS — BP 113/70 | HR 48 | Temp 98.2°F | Ht 69.0 in | Wt 247.0 lb

## 2018-07-11 DIAGNOSIS — E559 Vitamin D deficiency, unspecified: Secondary | ICD-10-CM | POA: Diagnosis not present

## 2018-07-11 DIAGNOSIS — Z6836 Body mass index (BMI) 36.0-36.9, adult: Secondary | ICD-10-CM

## 2018-07-11 DIAGNOSIS — E119 Type 2 diabetes mellitus without complications: Secondary | ICD-10-CM

## 2018-07-11 MED ORDER — VITAMIN D (ERGOCALCIFEROL) 50 MCG (2000 UT) PO CAPS: 1.0000 | ORAL_CAPSULE | Freq: Every day | ORAL | 0 refills | Status: DC

## 2018-07-11 NOTE — Progress Notes (Signed)
Office: [phone]  /  Fax: [phone]   HPI:   Chief Complaint: OBESITY Tanner Young is here to discuss his progress with his obesity treatment plan. He is on the Category 3 plan and is following his eating plan approximately 70 % of the time. He states he is exercising 0 minutes 0 times per week. Tanner Young. He reports that he is, at times, not eating his yogurt or cottage cheese at lunch. He is also not getting all of his snack calories in.  His weight is 247 lb (112 kg) today and has had a weight Young of 2 pounds over a period of 3 weeks since his last visit. He has lost 16 lbs since starting treatment with [name].  Vitamin D Deficiency Tanner Young has a diagnosis of vitamin D deficiency. He is currently taking prescription Vit D and denies nausea, vomiting or muscle weakness.  Diabetes II Tanner Young has a diagnosis of diabetes type II. Tanner Young states fasting BGs range between 87 and 115 and he denies hypoglycemia. He is on metformin and denies nausea, vomiting, or diarrhea. Last A1c was 6.2. He has been working on intensive lifestyle modifications including diet, exercise, and weight Young to help control his blood glucose levels.  ALLERGIES: Allergies  Allergen Reactions  . Shellfish Allergy Itching and Nausea And Vomiting    Shrimp     MEDICATIONS: Current Outpatient Medications on File Prior to Visit  Medication Sig Dispense Refill  . amLODipine (NORVASC) 10 MG tablet Take 10 mg by mouth daily.     . benazepril (LOTENSIN) 40 MG tablet Take 40 mg by mouth daily.    . blood glucose meter kit and supplies KIT Dispense based on patient and insurance preference. Use daily as directed to monitor blood sugar. (FOR ICD-9 250.00, 250.01). 1 each 0  . brimonidine (ALPHAGAN) 0.15 % ophthalmic solution Place 1 drop into both eyes 2 (two) times daily at 10 AM and 5 PM.    . cetirizine (ZYRTEC) 10 MG tablet Take 10 mg by mouth daily.    . cholecalciferol (VITAMIN D) 1000 UNITS  tablet Take 1,000 Units by mouth daily.    [name] desonide (DESOWEN) 0.05 % cream Apply topically 2 (two) times daily.    . dorzolamide-timolol (COSOPT) 22.3-6.8 MG/ML ophthalmic solution Place 1 drop into both eyes 2 (two) times daily.     [name] gabapentin (NEURONTIN) 300 MG capsule Take 300 mg by mouth at bedtime.    [name] ketoconazole (NIZORAL) 2 % cream Apply 1 application topically daily.    . metFORMIN (GLUCOPHAGE-XR) 500 MG 24 hr tablet Take 1 tablet (500 mg total) by mouth daily with breakfast. 90 tablet 1  . moxifloxacin (VIGAMOX) 0.5 % ophthalmic solution 1 drop 3 (three) times daily.    [name] omeprazole (PRILOSEC) 20 MG capsule Take 20 mg by mouth at bedtime.    [name] PRESCRIPTION MEDICATION Apply 1 application topically daily. Cream to feet    . PRESCRIPTION MEDICATION Apply 1 application topically daily. Cream to back    . RHOPRESSA 0.02 % SOLN INT 1 GTT IN OU Q NIGHT  3  . sildenafil (VIAGRA) 100 MG tablet Take 100 mg by mouth daily as needed for erectile dysfunction.    . simvastatin (ZOCOR) 20 MG tablet Take 10 mg by mouth at bedtime.    . terbinafine (LAMISIL) 1 % cream Apply 1 application topically 2 (two) times daily.    [name] triamcinolone ointment (KENALOG) 0.1 % Apply 1 application topically  2 (two) times daily.    [name] triamterene-hydrochlorothiazide (MAXZIDE) 75-50 MG per tablet Take 0.5 tablets by mouth daily.     No current facility-administered medications on file prior to visit.     PAST MEDICAL HISTORY: Past Medical History:  Diagnosis Date  . Arthritis   . Chronic back pain   . Chronic kidney disease   . Cough   . Diabetes mellitus type II, controlled ([place])   . Dry skin   . Floaters in visual field   . GERD (gastroesophageal reflux disease)   . Glaucoma, both eyes   . Hay fever   . Heartburn   . Hyperlipemia   . Hypertension   . Itching   . Joint pain   . Kidney disease   . Lactose intolerance   . Leg cramp   . Low back pain   . Multiple food allergies   . Muscle pain     . Neuropathy    rt leg from DDD  . Rash   . Sleep apnea    uses a cpap  . Stiff neck   . Swelling of lower extremity   . Trouble in sleeping   . Vision changes   . Vitamin D deficiency   . Wears glasses   . Wears partial dentures    top mid partial-flipper    PAST SURGICAL HISTORY: Past Surgical History:  Procedure Laterality Date  . COLONOSCOPY    . DENTAL SURGERY     extractions  . EYE SURGERY    . ORIF PATELLA Right 01/24/2013   Procedure: OPEN REDUCTION INTERNAL (ORIF) FIXATION PATELLA;  Surgeon: Kerin Salen, MD;  Location: [facility];  Service: Orthopedics;  Laterality: Right;  orif right patella,patellectomy     SOCIAL HISTORY: Social History   Tobacco Use  . Smoking status: Never Smoker  . Smokeless tobacco: Never Used  Substance Use Topics  . Alcohol use: No  . Drug use: No    FAMILY HISTORY: Family History  Problem Relation Age of Onset  . Arthritis Mother   . Hearing Young Mother   . Hyperlipidemia Mother   . Hypertension Mother   . Diabetes Mother   . Cancer Father   . Hypertension Father   . Diabetes Brother   . Hyperlipidemia Brother   . Hypertension Brother     ROS: Review of Systems  Constitutional: Positive for weight Young.  Gastrointestinal: Negative for diarrhea, nausea and vomiting.  Musculoskeletal:       Negative muscle weakness  Endo/Heme/Allergies:       Negative hypoglycemia    PHYSICAL EXAM: Blood pressure 113/70, pulse (!) 48, temperature 98.2 F (36.8 C), temperature source Oral, height '5\' 9"'$  (1.753 m), weight 247 lb (112 kg), SpO2 99 %. Body mass index is 36.48 kg/m. Physical Exam  Constitutional: He is oriented to person, place, and time. He appears well-developed and well-nourished.  Cardiovascular: Normal rate.  Pulmonary/Chest: Effort normal.  Musculoskeletal: Normal range of motion.  Neurological: He is oriented to person, place, and time.  Skin: Skin is warm and dry.  Psychiatric: He has a  normal mood and affect. His behavior is normal.  Vitals reviewed.   RECENT LABS AND TESTS: BMET    Component Value Date/Time   NA 137 01/20/2018 1057   K 4.0 01/20/2018 1057   CL 103 01/20/2018 1057   CO2 25 01/20/2018 1057   GLUCOSE 218 (H) 01/20/2018 1057   BUN 14 01/20/2018 1057   CREATININE 1.18  01/20/2018 1057   CALCIUM 9.5 01/20/2018 1057   Lab Results  Component Value Date   HGBA1C 6.2 (H) 05/08/2018   HGBA1C 7.6 (H) 01/27/2018   Lab Results  Component Value Date   INSULIN 19.3 05/08/2018   CBC    Component Value Date/Time   WBC 5.2 01/20/2018 1057   RBC 4.28 01/20/2018 1057   HGB 13.0 01/20/2018 1057   HCT 37.9 (L) 01/20/2018 1057   PLT 188.0 01/20/2018 1057   MCV 88.7 01/20/2018 1057   MCHC 34.4 01/20/2018 1057   RDW 13.6 01/20/2018 1057   Iron/TIBC/Ferritin/ %Sat No results found for: IRON, TIBC, FERRITIN, IRONPCTSAT Lipid Panel     Component Value Date/Time   CHOL 162 05/08/2018 1043   TRIG 123 05/08/2018 1043   HDL 42 05/08/2018 1043   LDLCALC 95 05/08/2018 1043   Hepatic Function Panel     Component Value Date/Time   PROT 6.7 01/20/2018 1057   ALBUMIN 4.0 01/20/2018 1057   AST 21 01/20/2018 1057   ALT 24 01/20/2018 1057   ALKPHOS 61 01/20/2018 1057   BILITOT 0.7 01/20/2018 1057      Component Value Date/Time   TSH 2.670 05/08/2018 1043   TSH 1.75 01/20/2018 1057  Results for DEONTRE, ALLSUP (MRN [id number]) as of 07/11/2018 16:24  Ref. Range 05/08/2018 10:43  Vitamin D, 25-Hydroxy Latest Ref Range: 30.0 - 100.0 ng/mL 45.5    ASSESSMENT AND PLAN: Vitamin D deficiency - Plan: Vitamin D, Ergocalciferol, 50 MCG (2000 UT) CAPS  Type 2 diabetes mellitus without complication, without long-term current use of insulin (HCC)  Class 2 severe obesity with serious comorbidity and body mass index (BMI) of 36.0 to 36.9 in adult, unspecified obesity type ([place])  PLAN:  Vitamin D Deficiency Tanner Young was informed that low vitamin D levels  contributes to fatigue and are associated with obesity, breast, and colon cancer. Tanner Young agrees to continue taking prescription Vit D '@50'$ ,000 IU every week #4 and we will refill for 1 month. He will follow up for routine testing of vitamin D, at least 2-3 times per year. He was informed of the risk of over-replacement of vitamin D and agrees to not increase his dose unless he discusses this with [name] first. Tanner Young agrees to follow up with our clinic in 3 weeks.  Diabetes II Tanner Young has been given extensive diabetes education by myself today including ideal fasting and post-prandial blood glucose readings, individual ideal Hgb A1c goals and hypoglycemia prevention. We discussed the importance of good blood sugar control to decrease the likelihood of diabetic complications such as nephropathy, neuropathy, limb Young, blindness, coronary artery disease, and death. We discussed the importance of intensive lifestyle modification including diet, exercise and weight Young as the first line treatment for diabetes. Marsel agrees to continue taking metformin, and continue diet and weight Young. Tanner Young agrees to follow up with our clinic in 3 weeks.  Obesity Tanner Young is currently in the action stage of change. As such, his goal is to continue with weight Young efforts He has agreed to follow the Category 3 plan Tanner Young has been instructed to work up to a goal of 150 minutes of combined cardio and strengthening exercise per week for weight Young and overall health benefits. We discussed the following Behavioral Modification Strategies today: work on meal planning and easy cooking plans and holiday eating strategies    Chief has agreed to follow up with our clinic in 3 weeks. He was informed of the importance of frequent follow  up visits to maximize his success with intensive lifestyle modifications for his multiple health conditions.   OBESITY BEHAVIORAL INTERVENTION VISIT  Today's visit was # 5   Starting weight: 263  lbs Starting date: 05/08/18 Today's weight : 247 lbs Today's date: 07/11/2018 Total lbs lost to date: 16 At least 15 minutes were spent on discussing the following behavioral intervention visit.   ASK: We discussed the diagnosis of obesity with [name] today and Jacqueline agreed to give [name] permission to discuss obesity behavioral modification therapy today.  ASSESS: Gottfried has the diagnosis of obesity and his BMI today is 36.46 Stellan is in the action stage of change   ADVISE: Chasyn was educated on the multiple health risks of obesity as well as the benefit of weight Young to improve his health. He was advised of the need for long term treatment and the importance of lifestyle modifications.  AGREE: Multiple dietary modification options and treatment options were discussed and  Zacharie agreed to the above obesity treatment plan.  Wilhemena Durie, am acting as transcriptionist for Abby Potash, PA-C I, Abby Potash, PA-C have reviewed above note and agree with its content

## 2018-08-03 ENCOUNTER — Encounter (INDEPENDENT_AMBULATORY_CARE_PROVIDER_SITE_OTHER): Payer: Self-pay | Admitting: Physician Assistant

## 2018-08-03 ENCOUNTER — Ambulatory Visit (INDEPENDENT_AMBULATORY_CARE_PROVIDER_SITE_OTHER): Payer: Medicare Other | Admitting: Physician Assistant

## 2018-08-03 VITALS — BP 119/69 | HR 53 | Temp 98.2°F | Ht 69.0 in | Wt 240.0 lb

## 2018-08-03 DIAGNOSIS — E119 Type 2 diabetes mellitus without complications: Secondary | ICD-10-CM | POA: Diagnosis not present

## 2018-08-03 DIAGNOSIS — E559 Vitamin D deficiency, unspecified: Secondary | ICD-10-CM

## 2018-08-03 DIAGNOSIS — Z6835 Body mass index (BMI) 35.0-35.9, adult: Secondary | ICD-10-CM | POA: Diagnosis not present

## 2018-08-03 MED ORDER — VITAMIN D (ERGOCALCIFEROL) 50 MCG (2000 UT) PO CAPS: 1.0000 | ORAL_CAPSULE | Freq: Every day | ORAL | 0 refills | Status: DC

## 2018-08-03 NOTE — Progress Notes (Signed)
Office: [phone]  /  Fax: [phone]   HPI:   Chief Complaint: OBESITY Tanner Young is here to discuss his progress with his obesity treatment plan. He is on the Category 3 plan and is following his eating plan approximately 70 % of the time. He states he is exercising 0 minutes 0 times per week. Tanner Young did very well with weight loss. He reports that he was working to get all of his snack calories in. His weight is 240 lb (108.9 kg) today and has had a weight loss of 7 pounds over a period of 3 weeks since his last visit. He has lost 23 lbs since starting treatment with [name].  Diabetes II Tanner Young has a diagnosis of diabetes type II. Tanner Young states fasting BGs range between 80 and 108 and he denies any hypoglycemic episodes or polyphagia. Tanner Young denies any nausea, vomiting or diarrhea on metformin. Last A1c was at 6.2 on 05/08/18 He has been working on intensive lifestyle modifications including diet, exercise, and weight loss to help control his blood glucose levels.  Vitamin D deficiency Tanner Young has a diagnosis of vitamin D deficiency. He is currently taking 2,000 units vit D and denies nausea, vomiting or muscle weakness.  ASSESSMENT AND PLAN:  Type 2 diabetes mellitus without complication, without long-term current use of insulin (HCC)  Vitamin D deficiency - Plan: Vitamin D, Ergocalciferol, 50 MCG (2000 UT) CAPS  Class 2 severe obesity with serious comorbidity and body mass index (BMI) of 35.0 to 35.9 in adult, unspecified obesity type (Tanner Young)  PLAN:  Diabetes II Tanner Young has been given extensive diabetes education by myself today including ideal fasting and post-prandial blood glucose readings, individual ideal Hgb A1c goals and hypoglycemia prevention. We discussed the importance of good blood sugar control to decrease the likelihood of diabetic complications such as nephropathy, neuropathy, limb loss, blindness, coronary artery disease, and death. We discussed the importance of intensive  lifestyle modification including diet, exercise and weight loss as the first line treatment for diabetes. Tanner Young agrees to continue his diabetes medications and will follow up at the agreed upon time.  Vitamin D Deficiency Tanner Young was informed that low vitamin D levels contributes to fatigue and are associated with obesity, breast, and colon cancer. He agrees to continue to take prescription Vit D '@2'$ ,000 units daily #30 with no refills and will follow up for routine testing of vitamin D, at least 2-3 times per year. He was informed of the risk of over-replacement of vitamin D and agrees to not increase his dose unless he discusses this with [name] first. Tanner Young agrees to follow up as directed.  Obesity Tanner Young is currently in the action stage of change. As such, his goal is to continue with weight loss efforts He has agreed to follow the Category 3 plan Tanner Young has been instructed to work up to a goal of 150 minutes of combined cardio and strengthening exercise per week for weight loss and overall health benefits. We discussed the following Behavioral Modification Strategies today: work on meal planning and easy cooking plans, holiday eating strategies and celebration eating strategies  Tanner Young has agreed to follow up with our clinic in 3 weeks. He was informed of the importance of frequent follow up visits to maximize his success with intensive lifestyle modifications for his multiple health conditions.  ALLERGIES: Allergies  Allergen Reactions  . Shellfish Allergy Itching and Nausea And Vomiting    Shrimp     MEDICATIONS: Current Outpatient Medications on File Prior to Visit  Medication Sig Dispense Refill  . amLODipine (NORVASC) 10 MG tablet Take 10 mg by mouth daily.     . benazepril (LOTENSIN) 40 MG tablet Take 40 mg by mouth daily.    . blood glucose meter kit and supplies KIT Dispense based on patient and insurance preference. Use daily as directed to monitor blood sugar. (FOR ICD-9 250.00,  250.01). 1 each 0  . brimonidine (ALPHAGAN) 0.15 % ophthalmic solution Place 1 drop into both eyes 2 (two) times daily at 10 AM and 5 PM.    . cetirizine (ZYRTEC) 10 MG tablet Take 10 mg by mouth daily.    . cholecalciferol (VITAMIN D) 1000 UNITS tablet Take 1,000 Units by mouth daily.    [name] desonide (DESOWEN) 0.05 % cream Apply topically 2 (two) times daily.    . dorzolamide-timolol (COSOPT) 22.3-6.8 MG/ML ophthalmic solution Place 1 drop into both eyes 2 (two) times daily.     [name] gabapentin (NEURONTIN) 300 MG capsule Take 300 mg by mouth at bedtime.    [name] ketoconazole (NIZORAL) 2 % cream Apply 1 application topically daily.    . metFORMIN (GLUCOPHAGE-XR) 500 MG 24 hr tablet Take 1 tablet (500 mg total) by mouth daily with breakfast. 90 tablet 1  . moxifloxacin (VIGAMOX) 0.5 % ophthalmic solution 1 drop 3 (three) times daily.    [name] omeprazole (PRILOSEC) 20 MG capsule Take 20 mg by mouth at bedtime.    [name] PRESCRIPTION MEDICATION Apply 1 application topically daily. Cream to feet    . PRESCRIPTION MEDICATION Apply 1 application topically daily. Cream to back    . RHOPRESSA 0.02 % SOLN INT 1 GTT IN OU Q NIGHT  3  . sildenafil (VIAGRA) 100 MG tablet Take 100 mg by mouth daily as needed for erectile dysfunction.    . simvastatin (ZOCOR) 20 MG tablet Take 10 mg by mouth at bedtime.    . terbinafine (LAMISIL) 1 % cream Apply 1 application topically 2 (two) times daily.    [name] triamcinolone ointment (KENALOG) 0.1 % Apply 1 application topically 2 (two) times daily.    [name] triamterene-hydrochlorothiazide (MAXZIDE) 75-50 MG per tablet Take 0.5 tablets by mouth daily.     No current facility-administered medications on file prior to visit.     PAST MEDICAL HISTORY: Past Medical History:  Diagnosis Date  . Arthritis   . Chronic back pain   . Chronic kidney disease   . Cough   . Diabetes mellitus type II, controlled ([place])   . Dry skin   . Floaters in visual field   . GERD (gastroesophageal reflux  disease)   . Glaucoma, both eyes   . Hay fever   . Heartburn   . Hyperlipemia   . Hypertension   . Itching   . Joint pain   . Kidney disease   . Lactose intolerance   . Leg cramp   . Low back pain   . Multiple food allergies   . Muscle pain   . Neuropathy    rt leg from DDD  . Rash   . Sleep apnea    uses a cpap  . Stiff neck   . Swelling of lower extremity   . Trouble in sleeping   . Vision changes   . Vitamin D deficiency   . Wears glasses   . Wears partial dentures    top mid partial-flipper    PAST SURGICAL HISTORY: Past Surgical History:  Procedure Laterality Date  . COLONOSCOPY    .  DENTAL SURGERY     extractions  . EYE SURGERY    . ORIF PATELLA Right 01/24/2013   Procedure: OPEN REDUCTION INTERNAL (ORIF) FIXATION PATELLA;  Surgeon: Kerin Salen, MD;  Location: [facility];  Service: Orthopedics;  Laterality: Right;  orif right patella,patellectomy     SOCIAL HISTORY: Social History   Tobacco Use  . Smoking status: Never Smoker  . Smokeless tobacco: Never Used  Substance Use Topics  . Alcohol use: No  . Drug use: No    FAMILY HISTORY: Family History  Problem Relation Age of Onset  . Arthritis Mother   . Hearing loss Mother   . Hyperlipidemia Mother   . Hypertension Mother   . Diabetes Mother   . Cancer Father   . Hypertension Father   . Diabetes Brother   . Hyperlipidemia Brother   . Hypertension Brother     ROS: Review of Systems  Constitutional: Positive for weight loss.  Gastrointestinal: Negative for diarrhea, nausea and vomiting.  Musculoskeletal:       Negative for muscle weakness  Endo/Heme/Allergies:       Negative for polyphagia Negative for hypoglycemia    PHYSICAL EXAM: Blood pressure 119/69, pulse (!) 53, temperature 98.2 F (36.8 C), temperature source Oral, height '5\' 9"'$  (1.753 m), weight 240 lb (108.9 kg), SpO2 99 %. Body mass index is 35.44 kg/m. Physical Exam Vitals signs reviewed.  Constitutional:       Appearance: Normal appearance. He is well-developed. He is obese.  Cardiovascular:     Rate and Rhythm: Normal rate.  Pulmonary:     Effort: Pulmonary effort is normal.  Musculoskeletal: Normal range of motion.  Skin:    General: Skin is warm and dry.  Neurological:     Mental Status: He is alert and oriented to person, place, and time.  Psychiatric:        Mood and Affect: Mood normal.        Behavior: Behavior normal.     RECENT LABS AND TESTS: BMET    Component Value Date/Time   NA 137 01/20/2018 1057   K 4.0 01/20/2018 1057   CL 103 01/20/2018 1057   CO2 25 01/20/2018 1057   GLUCOSE 218 (H) 01/20/2018 1057   BUN 14 01/20/2018 1057   CREATININE 1.18 01/20/2018 1057   CALCIUM 9.5 01/20/2018 1057   Lab Results  Component Value Date   HGBA1C 6.2 (H) 05/08/2018   HGBA1C 7.6 (H) 01/27/2018   Lab Results  Component Value Date   INSULIN 19.3 05/08/2018   CBC    Component Value Date/Time   WBC 5.2 01/20/2018 1057   RBC 4.28 01/20/2018 1057   HGB 13.0 01/20/2018 1057   HCT 37.9 (L) 01/20/2018 1057   PLT 188.0 01/20/2018 1057   MCV 88.7 01/20/2018 1057   MCHC 34.4 01/20/2018 1057   RDW 13.6 01/20/2018 1057   Iron/TIBC/Ferritin/ %Sat No results found for: IRON, TIBC, FERRITIN, IRONPCTSAT Lipid Panel     Component Value Date/Time   CHOL 162 05/08/2018 1043   TRIG 123 05/08/2018 1043   HDL 42 05/08/2018 1043   LDLCALC 95 05/08/2018 1043   Hepatic Function Panel     Component Value Date/Time   PROT 6.7 01/20/2018 1057   ALBUMIN 4.0 01/20/2018 1057   AST 21 01/20/2018 1057   ALT 24 01/20/2018 1057   ALKPHOS 61 01/20/2018 1057   BILITOT 0.7 01/20/2018 1057      Component Value Date/Time   TSH 2.670  05/08/2018 1043   TSH 1.75 01/20/2018 1057    Ref. Range 05/08/2018 10:43  Vitamin D, 25-Hydroxy Latest Ref Range: 30.0 - 100.0 ng/mL 45.5     OBESITY BEHAVIORAL INTERVENTION VISIT  Today's visit was # 6   Starting weight: 263 lbs Starting date:  05/08/2018 Today's weight : 240 lbs Today's date: 08/03/2018 Total lbs lost to date: 23 At least 15 minutes were spent on discussing the following behavioral intervention visit.   ASK: We discussed the diagnosis of obesity with Tanner Young today and Tanner Young agreed to give [name] permission to discuss obesity behavioral modification therapy today.  ASSESS: [place] has the diagnosis of obesity and his BMI today is 35.43 Tanner Young is in the action stage of change   ADVISE: Isom was educated on the multiple health risks of obesity as well as the benefit of weight loss to improve his health. He was advised of the need for long term treatment and the importance of lifestyle modifications to improve his current health and to decrease his risk of future health problems.  AGREE: Multiple dietary modification options and treatment options were discussed and  Tanner Young agreed to follow the recommendations documented in the above note.  ARRANGE: Tanner Young was educated on the importance of frequent visits to treat obesity as outlined per CMS and USPSTF guidelines and agreed to schedule his next follow up appointment today.  Corey Skains, am acting as transcriptionist for Abby Potash, PA-C I, Abby Potash, PA-C have reviewed above note and agree with its content

## 2018-08-23 DIAGNOSIS — H4052X2 Glaucoma secondary to other eye disorders, left eye, moderate stage: Secondary | ICD-10-CM | POA: Diagnosis not present

## 2018-08-23 DIAGNOSIS — H4051X3 Glaucoma secondary to other eye disorders, right eye, severe stage: Secondary | ICD-10-CM | POA: Diagnosis not present

## 2018-09-04 ENCOUNTER — Ambulatory Visit: Payer: Self-pay

## 2018-09-04 NOTE — Telephone Encounter (Signed)
rec'd call from pt. re: concern about lower pulse rate.  Reported he wears an Apple watch, and has noticed pulse rate in the lower to mid 40's at times over past month.  Pt. Stated his pulse is 55 at present time; per Apple watch.  Stated he doesn't really have any ill feeling.  Denied fatigue, dizziness, weakness, shortness of breath, or chest pain.  Can't recall exact BP, but thinks a recent BP reading was 114/?.  Reported he has lost about 70 # over past 7 months, intentionally.  Questions if his current medication needs to be reevaluated with the change in his weight.  Appt. sched. for 1/27.  Pt. advised to call office if develops dizziness, weakness, or any other ill feeling, prior to the appt. 1/27.   Verb. Understanding.  Agrees with plan.      Answer Assessment - Initial Assessment Questions 1. DESCRIPTION: "Please describe your heart rate or heart beat that you are having" (e.g., fast/slow, regular/irregular, skipped or extra beats, "palpitations")     Has noted lower pulse reading over last month 2. ONSET: "When did it start?" (Minutes, hours or days)      Past mo.  3. DURATION: "How long does it last" (e.g., seconds, minutes, hours)     *No Answer* 4. PATTERN "Does it come and go, or has it been constant since it started?"  "Does it get worse with exertion?"   "Are you feeling it now?"     *No Answer* 5. TAP: "Using your hand, can you tap out what you are feeling on a chair or table in front of you, so that I can hear?" (Note: not all patients can do this)       *No Answer* 6. HEART RATE: "Can you tell me your heart rate?" "How many beats in 15 seconds?"  (Note: not all patients can do this)      Checked per Apple watch; 55  7. RECURRENT SYMPTOM: "Have you ever had this before?" If so, ask: "When was the last time?" and "What happened that time?"      *No Answer* 8. CAUSE: "What do you think is causing the palpitations?"    Has lost 60 # intentionally 9. CARDIAC HISTORY: "Do you have  any history of heart disease?" (e.g., heart attack, angina, bypass surgery, angioplasty, arrhythmia)      *No Answer* 10. OTHER SYMPTOMS: "Do you have any other symptoms?" (e.g., dizziness, chest pain, sweating, difficulty breathing)       Denied dizziness, shortness of breath, chest pain,  11. PREGNANCY: "Is there any chance you are pregnant?" "When was your last menstrual period?"       N/a  Protocols used: [place]

## 2018-09-07 ENCOUNTER — Encounter (INDEPENDENT_AMBULATORY_CARE_PROVIDER_SITE_OTHER): Payer: Self-pay | Admitting: Physician Assistant

## 2018-09-07 ENCOUNTER — Ambulatory Visit (INDEPENDENT_AMBULATORY_CARE_PROVIDER_SITE_OTHER): Payer: Medicare Other | Admitting: Physician Assistant

## 2018-09-07 VITALS — BP 107/72 | HR 53 | Temp 98.1°F | Ht 69.0 in | Wt 235.0 lb

## 2018-09-07 DIAGNOSIS — E559 Vitamin D deficiency, unspecified: Secondary | ICD-10-CM

## 2018-09-07 DIAGNOSIS — E119 Type 2 diabetes mellitus without complications: Secondary | ICD-10-CM | POA: Diagnosis not present

## 2018-09-07 DIAGNOSIS — E669 Obesity, unspecified: Secondary | ICD-10-CM | POA: Diagnosis not present

## 2018-09-07 DIAGNOSIS — Z6834 Body mass index (BMI) 34.0-34.9, adult: Secondary | ICD-10-CM

## 2018-09-07 MED ORDER — VITAMIN D (ERGOCALCIFEROL) 50 MCG (2000 UT) PO CAPS: 1.0000 | ORAL_CAPSULE | Freq: Every day | ORAL | 0 refills | Status: DC

## 2018-09-07 NOTE — Progress Notes (Signed)
Office: [phone]  /  Fax: [phone]   HPI:   Chief Complaint: OBESITY Jamarkis is here to discuss his progress with his obesity treatment plan. He is on the Category 3 plan and is following his eating plan approximately 60 % of the time. He states he is exercising 0 minutes 0 times per week. Bronc did well weight loss. He reports that he has not been able to use his kitchen and therefore has been eating out more.  His weight is 235 lb (106.6 kg) today and has had a weight loss of 5 pounds over a period of 5 weeks since his last visit. He has lost 28 lbs since starting treatment with [name].  Diabetes II Kobe has a diagnosis of diabetes type II. Brenen states fasting BGs range between 80 and 118 and denies any hypoglycemic episodes. He is taking metformin. He denies nausea, vomiting or diarrhea. Last A1c was Hemoglobin A1C Latest Ref Rng & Units 05/08/2018 01/27/2018  HGBA1C 4.8 - 5.6 % 6.2(H) 7.6(H)  Some recent data might be hidden    He has been working on intensive lifestyle modifications including diet, exercise, and weight loss to help control his blood glucose levels.  Vitamin D deficiency Julyan has a diagnosis of vitamin D deficiency. He is currently taking prescription Vit D and denies nausea, vomiting or muscle weakness.   ASSESSMENT AND PLAN:  Type 2 diabetes mellitus without complication, without long-term current use of insulin (HCC)  Vitamin D deficiency - Plan: Vitamin D, Ergocalciferol, 50 MCG (2000 UT) CAPS  Class 1 obesity with serious comorbidity and body mass index (BMI) of 34.0 to 34.9 in adult, unspecified obesity type  PLAN:  Diabetes II Rashi has been given extensive diabetes education by myself today including ideal fasting and post-prandial blood glucose readings, individual ideal Hgb A1c goals  and hypoglycemia prevention. We discussed the importance of good blood sugar control to decrease the likelihood of diabetic complications such as  nephropathy, neuropathy, limb loss, blindness, coronary artery disease, and death. We discussed the importance of intensive lifestyle modification including diet, exercise and weight loss as the first line treatment for diabetes. Linden agrees to continue with loss and taking his diabetes medications. Daimon agrees to follow up with our clinic in 4 weeks.  Vitamin D Deficiency Rodrigues was informed that low vitamin D levels contributes to fatigue and are associated with obesity, breast, and colon cancer. He agrees to continue to take prescription Vit D 2000 IU daily #30 with no refills. He will follow up for routine testing of vitamin D, at least 2-3 times per year. He was informed of the risk of over-replacement of vitamin D and agrees to not increase his dose unless he discusses this with [name] first. Dannielle Huh agrees to follow up with our clinic in 4 weeks.  Obesity Jeet is currently in the action stage of change. As such, his goal is to continue with weight loss efforts He has agreed to follow the Category 3 plan Rayjon has been instructed to work up to a goal of 150 minutes of combined cardio and strengthening exercise per week for weight loss and overall health benefits. We discussed the following Behavioral Modification Strategies today: work on meal planning and easy cooking plans and planning for success  Khai has agreed to follow up with our clinic in 4 weeks. He was informed of the importance of frequent follow up visits to maximize his success with intensive lifestyle modifications for his multiple health conditions.  ALLERGIES:  Allergies  Allergen Reactions  . Shellfish Allergy Itching and Nausea And Vomiting    Shrimp     MEDICATIONS: Current Outpatient Medications on File Prior to Visit  Medication Sig Dispense Refill  . amLODipine (NORVASC) 10 MG tablet Take 10 mg by mouth daily.     . benazepril (LOTENSIN) 40 MG tablet Take 40 mg by mouth daily.    . blood glucose meter kit  and supplies KIT Dispense based on patient and insurance preference. Use daily as directed to monitor blood sugar. (FOR ICD-9 250.00, 250.01). 1 each 0  . brimonidine (ALPHAGAN) 0.15 % ophthalmic solution Place 1 drop into both eyes 2 (two) times daily at 10 AM and 5 PM.    . cetirizine (ZYRTEC) 10 MG tablet Take 10 mg by mouth daily.    . cholecalciferol (VITAMIN D) 1000 UNITS tablet Take 1,000 Units by mouth daily.    [name] desonide (DESOWEN) 0.05 % cream Apply topically 2 (two) times daily.    . dorzolamide-timolol (COSOPT) 22.3-6.8 MG/ML ophthalmic solution Place 1 drop into both eyes 2 (two) times daily.     [name] gabapentin (NEURONTIN) 300 MG capsule Take 300 mg by mouth at bedtime.    [name] ketoconazole (NIZORAL) 2 % cream Apply 1 application topically daily.    . metFORMIN (GLUCOPHAGE-XR) 500 MG 24 hr tablet Take 1 tablet (500 mg total) by mouth daily with breakfast. 90 tablet 1  . moxifloxacin (VIGAMOX) 0.5 % ophthalmic solution 1 drop 3 (three) times daily.    [name] omeprazole (PRILOSEC) 20 MG capsule Take 20 mg by mouth at bedtime.    [name] PRESCRIPTION MEDICATION Apply 1 application topically daily. Cream to feet    . PRESCRIPTION MEDICATION Apply 1 application topically daily. Cream to back    . RHOPRESSA 0.02 % SOLN INT 1 GTT IN OU Q NIGHT  3  . sildenafil (VIAGRA) 100 MG tablet Take 100 mg by mouth daily as needed for erectile dysfunction.    . simvastatin (ZOCOR) 20 MG tablet Take 10 mg by mouth at bedtime.    . terbinafine (LAMISIL) 1 % cream Apply 1 application topically 2 (two) times daily.    [name] triamcinolone ointment (KENALOG) 0.1 % Apply 1 application topically 2 (two) times daily.    [name] triamterene-hydrochlorothiazide (MAXZIDE) 75-50 MG per tablet Take 0.5 tablets by mouth daily.     No current facility-administered medications on file prior to visit.     PAST MEDICAL HISTORY: Past Medical History:  Diagnosis Date  . Arthritis   . Chronic back pain   . Chronic kidney disease   . Cough     . Diabetes mellitus type II, controlled ([place])   . Dry skin   . Floaters in visual field   . GERD (gastroesophageal reflux disease)   . Glaucoma, both eyes   . Hay fever   . Heartburn   . Hyperlipemia   . Hypertension   . Itching   . Joint pain   . Kidney disease   . Lactose intolerance   . Leg cramp   . Low back pain   . Multiple food allergies   . Muscle pain   . Neuropathy    rt leg from DDD  . Rash   . Sleep apnea    uses a cpap  . Stiff neck   . Swelling of lower extremity   . Trouble in sleeping   . Vision changes   . Vitamin D deficiency   . Wears glasses   .  Wears partial dentures    top mid partial-flipper    PAST SURGICAL HISTORY: Past Surgical History:  Procedure Laterality Date  . COLONOSCOPY    . DENTAL SURGERY     extractions  . EYE SURGERY    . ORIF PATELLA Right 01/24/2013   Procedure: OPEN REDUCTION INTERNAL (ORIF) FIXATION PATELLA;  Surgeon: Kerin Salen, MD;  Location: [facility];  Service: Orthopedics;  Laterality: Right;  orif right patella,patellectomy     SOCIAL HISTORY: Social History   Tobacco Use  . Smoking status: Never Smoker  . Smokeless tobacco: Never Used  Substance Use Topics  . Alcohol use: No  . Drug use: No    FAMILY HISTORY: Family History  Problem Relation Age of Onset  . Arthritis Mother   . Hearing loss Mother   . Hyperlipidemia Mother   . Hypertension Mother   . Diabetes Mother   . Cancer Father   . Hypertension Father   . Diabetes Brother   . Hyperlipidemia Brother   . Hypertension Brother     ROS: Review of Systems  Constitutional: Positive for weight loss.  Gastrointestinal: Negative for diarrhea, nausea and vomiting.  Musculoskeletal:       Negative for muscle weakness  Endo/Heme/Allergies:       Negative for hypoglycemia     PHYSICAL EXAM: Blood pressure 107/72, pulse (!) 53, temperature 98.1 F (36.7 C), temperature source Oral, height _0  (1.753 m), weight 235 lb (106.6  kg), SpO2 100 %. Body mass index is 34.7 kg/m. Physical Exam Vitals signs reviewed.  Constitutional:      Appearance: Normal appearance. He is obese.  Cardiovascular:     Rate and Rhythm: Normal rate.  Pulmonary:     Effort: Pulmonary effort is normal.  Musculoskeletal: Normal range of motion.  Skin:    General: Skin is warm and dry.  Neurological:     Mental Status: He is alert and oriented to person, place, and time.  Psychiatric:        Mood and Affect: Mood normal.     RECENT LABS AND TESTS: BMET    Component Value Date/Time   NA 137 01/20/2018 1057   K 4.0 01/20/2018 1057   CL 103 01/20/2018 1057   CO2 25 01/20/2018 1057   GLUCOSE 218 (H) 01/20/2018 1057   BUN 14 01/20/2018 1057   CREATININE 1.18 01/20/2018 1057   CALCIUM 9.5 01/20/2018 1057   Lab Results  Component Value Date   HGBA1C 6.2 (H) 05/08/2018   HGBA1C 7.6 (H) 01/27/2018   Lab Results  Component Value Date   INSULIN 19.3 05/08/2018   CBC    Component Value Date/Time   WBC 5.2 01/20/2018 1057   RBC 4.28 01/20/2018 1057   HGB 13.0 01/20/2018 1057   HCT 37.9 (L) 01/20/2018 1057   PLT 188.0 01/20/2018 1057   MCV 88.7 01/20/2018 1057   MCHC 34.4 01/20/2018 1057   RDW 13.6 01/20/2018 1057   Iron/TIBC/Ferritin/ %Sat No results found for: IRON, TIBC, FERRITIN, IRONPCTSAT Lipid Panel     Component Value Date/Time   CHOL 162 05/08/2018 1043   TRIG 123 05/08/2018 1043   HDL 42 05/08/2018 1043   LDLCALC 95 05/08/2018 1043   Hepatic Function Panel     Component Value Date/Time   PROT 6.7 01/20/2018 1057   ALBUMIN 4.0 01/20/2018 1057   AST 21 01/20/2018 1057   ALT 24 01/20/2018 1057   ALKPHOS 61 01/20/2018 1057   BILITOT 0.7  01/20/2018 1057      Component Value Date/Time   TSH 2.670 05/08/2018 1043   TSH 1.75 01/20/2018 1057     Ref. Range 05/08/2018 10:43  Vitamin D, 25-Hydroxy Latest Ref Range: 30.0 - 100.0 ng/mL 45.5     OBESITY BEHAVIORAL INTERVENTION VISIT  Today's visit  was # 7   Starting weight: 263 lbs Starting date: 05/08/2018 Today's weight :: 235 lbs Today's date: 09/07/2018 Total lbs lost to date: 28 At least 15 minutes were spent on discussing the following behavioral intervention visit.   ASK: We discussed the diagnosis of obesity with [name] today and Kasai agreed to give [name] permission to discuss obesity behavioral modification therapy today.  ASSESS: Sherrell has the diagnosis of obesity and his BMI today is 34.69 Yoon is in the action stage of change   ADVISE: Igor was educated on the multiple health risks of obesity as well as the benefit of weight loss to improve his health. He was advised of the need for long term treatment and the importance of lifestyle modifications to improve his current health and to decrease his risk of future health problems.  AGREE: Multiple dietary modification options and treatment options were discussed and  Shermon agreed to follow the recommendations documented in the above note.  ARRANGE: Phinehas was educated on the importance of frequent visits to treat obesity as outlined per CMS and USPSTF guidelines and agreed to schedule his next follow up appointment today.  I, Tammy Wysor, am acting as Location manager for [employer] I, Abby Potash, PA-C have reviewed above note and agree with its content

## 2018-09-11 ENCOUNTER — Ambulatory Visit: Payer: Medicare Other | Admitting: Family Medicine

## 2018-09-13 ENCOUNTER — Encounter: Payer: Self-pay | Admitting: Family Medicine

## 2018-09-13 ENCOUNTER — Ambulatory Visit (INDEPENDENT_AMBULATORY_CARE_PROVIDER_SITE_OTHER): Payer: Medicare Other | Admitting: Family Medicine

## 2018-09-13 VITALS — BP 122/64 | HR 52 | Temp 98.2°F | Ht 69.0 in | Wt 227.0 lb

## 2018-09-13 DIAGNOSIS — R001 Bradycardia, unspecified: Secondary | ICD-10-CM | POA: Diagnosis not present

## 2018-09-13 LAB — BASIC METABOLIC PANEL
BUN: 24 mg/dL — AB (ref 6–23)
CO2: 27 meq/L (ref 19–32)
Calcium: 10 mg/dL (ref 8.4–10.5)
Chloride: 102 mEq/L (ref 96–112)
Creatinine, Ser: 1.17 mg/dL (ref 0.40–1.50)
GFR: 75.92 mL/min (ref >60.00)
GLUCOSE: 99 mg/dL (ref 70–99)
POTASSIUM: 3.7 meq/L (ref 3.5–5.1)
SODIUM: 137 meq/L (ref 135–145)

## 2018-09-13 LAB — TSH: TSH: 2.28 u[IU]/mL (ref 0.35–4.50)

## 2018-09-13 NOTE — Assessment & Plan Note (Addendum)
-  He is asymptomatic and EKG shows sinus bradycardia with normal PR and QT interval.  This is similar to tracing from 04/2018 -Discussed amlodipine may sometimes contribute to this some but likely not the cause in his case -Reassured today that this is likely normal for him but will check TSH and bmp as well to be sure K+ levels are normal.

## 2018-09-13 NOTE — Patient Instructions (Signed)
-  Your EKG readings are similar to your previous EKG, which is reassuring.  -We'll be in touch with lab results.

## 2018-09-13 NOTE — Progress Notes (Signed)
Tanner Young - 63 y.o. male MRN [id number]  Date of birth: 1955-10-21  Subjective Chief Complaint  Patient presents with  . Other    has noticed his pulse being low intermittently since getting a new watch-low 40's denies SOB     HPI Tanner Young  63 y.o. male with history of HTN, CKD, T2DM and glaucoma here today with concern of low heart rate.  Recently got an Apple watch and has gotten alerts of low heart rate into mid 40's.  He denies any symptoms related symptoms including dizziness, fatigue, shortness of breath,palpitations, headache or chest pain.  He receives the majority of his medications through the [place] [facility] and denies any additional medications other than what is listed in his current medical record.    He has been following with bariatric clinic recently as well and weight is down about 30 lbs since September.    ROS:  A comprehensive ROS was completed and negative except as noted per HPI   Allergies  Allergen Reactions  . Shellfish Allergy Itching and Nausea And Vomiting    Shrimp     Past Medical History:  Diagnosis Date  . Arthritis   . Chronic back pain   . Chronic kidney disease   . Cough   . Diabetes mellitus type II, controlled ([place])   . Dry skin   . Floaters in visual field   . GERD (gastroesophageal reflux disease)   . Glaucoma, both eyes   . Hay fever   . Heartburn   . Hyperlipemia   . Hypertension   . Itching   . Joint pain   . Kidney disease   . Lactose intolerance   . Leg cramp   . Low back pain   . Multiple food allergies   . Muscle pain   . Neuropathy    rt leg from DDD  . Rash   . Sleep apnea    uses a cpap  . Stiff neck   . Swelling of lower extremity   . Trouble in sleeping   . Vision changes   . Vitamin D deficiency   . Wears glasses   . Wears partial dentures    top mid partial-flipper    Past Surgical History:  Procedure Laterality Date  . COLONOSCOPY    . DENTAL SURGERY     extractions  . EYE SURGERY    . ORIF  PATELLA Right 01/24/2013   Procedure: OPEN REDUCTION INTERNAL (ORIF) FIXATION PATELLA;  Surgeon: Kerin Salen, MD;  Location: [facility];  Service: Orthopedics;  Laterality: Right;  orif right patella,patellectomy     Social History   Socioeconomic History  . Marital status: Married    Spouse name: Runner, broadcasting/film/video  . Number of children: Not on file  . Years of education: Not on file  . Highest education level: Not on file  Occupational History  . Occupation: Retired  Scientific laboratory technician  . Financial resource strain: Not on file  . Food insecurity:    Worry: Not on file    Inability: Not on file  . Transportation needs:    Medical: Not on file    Non-medical: Not on file  Tobacco Use  . Smoking status: Never Smoker  . Smokeless tobacco: Never Used  Substance and Sexual Activity  . Alcohol use: No  . Drug use: No  . Sexual activity: Not on file  Lifestyle  . Physical activity:    Days per week: Not on file  Minutes per session: Not on file  . Stress: Not on file  Relationships  . Social connections:    Talks on phone: Not on file    Gets together: Not on file    Attends religious service: Not on file    Active member of club or organization: Not on file    Attends meetings of clubs or organizations: Not on file    Relationship status: Not on file  Other Topics Concern  . Not on file  Social History Narrative  . Not on file    Family History  Problem Relation Age of Onset  . Arthritis Mother   . Hearing loss Mother   . Hyperlipidemia Mother   . Hypertension Mother   . Diabetes Mother   . Cancer Father   . Hypertension Father   . Diabetes Brother   . Hyperlipidemia Brother   . Hypertension Brother     Health Maintenance  Topic Date Due  . Hepatitis C Screening  1955/12/28  . FOOT EXAM  08/06/1964  . TETANUS/TDAP  08/06/1973  . COLONOSCOPY  08/06/2004  . HIV Screening  01/21/2019 (Originally 08/06/1969)  . HEMOGLOBIN A1C  11/06/2018  . OPHTHALMOLOGY  EXAM  02/16/2019  . INFLUENZA VACCINE  Completed    ----------------------------------------------------------------------------------------------------------------------------------------------------------------------------------------------------------------- Physical Exam BP 122/64   Pulse (!) 52   Temp 98.2 F (36.8 C) (Oral)   Ht 5\' 9"  (1.753 m)   Wt 227 lb (103 kg)   SpO2 95%   BMI 33.52 kg/m   Physical Exam Constitutional:      General: He is not in acute distress. HENT:     Head: Normocephalic and atraumatic.  Eyes:     General: No scleral icterus. Neck:     Musculoskeletal: Neck supple.  Cardiovascular:     Rate and Rhythm: Normal rate and regular rhythm.  Pulmonary:     Effort: Pulmonary effort is normal.     Breath sounds: Normal breath sounds.  Skin:    General: Skin is warm and dry.     Findings: No rash.  Neurological:     General: No focal deficit present.     Mental Status: He is alert and oriented to person, place, and time.  Psychiatric:        Mood and Affect: Mood normal.        Behavior: Behavior normal.     ------------------------------------------------------------------------------------------------------------------------------------------------------------------------------------------------------------------- Assessment and Plan  Bradycardia -He is asymptomatic and EKG shows sinus bradycardia with normal PR and QT interval.  This is similar to tracing from 04/2018 -Discussed amlodipine may sometimes contribute to this some but likely not the cause in his case -Reassured today that this is likely normal for him but will check TSH and bmp as well to be sure K+ levels are normal.

## 2018-09-25 ENCOUNTER — Encounter (INDEPENDENT_AMBULATORY_CARE_PROVIDER_SITE_OTHER): Payer: Self-pay | Admitting: Physician Assistant

## 2018-10-05 ENCOUNTER — Ambulatory Visit (INDEPENDENT_AMBULATORY_CARE_PROVIDER_SITE_OTHER): Payer: Medicare Other | Admitting: Physician Assistant

## 2018-10-05 ENCOUNTER — Encounter (INDEPENDENT_AMBULATORY_CARE_PROVIDER_SITE_OTHER): Payer: Self-pay | Admitting: Physician Assistant

## 2018-10-05 VITALS — BP 112/71 | HR 56 | Temp 98.4°F | Ht 69.0 in | Wt 233.0 lb

## 2018-10-05 DIAGNOSIS — Z6834 Body mass index (BMI) 34.0-34.9, adult: Secondary | ICD-10-CM

## 2018-10-05 DIAGNOSIS — E559 Vitamin D deficiency, unspecified: Secondary | ICD-10-CM | POA: Diagnosis not present

## 2018-10-05 DIAGNOSIS — E669 Obesity, unspecified: Secondary | ICD-10-CM

## 2018-10-05 DIAGNOSIS — E119 Type 2 diabetes mellitus without complications: Secondary | ICD-10-CM | POA: Diagnosis not present

## 2018-10-05 NOTE — Progress Notes (Signed)
Office: [phone]  /  Fax: [phone]   HPI:   Chief Complaint: OBESITY Abednego is here to discuss his progress with his obesity treatment plan. He is on the Category 3 plan and is following his eating plan approximately 50% of the time. He states he is exercising 0 minutes 0 times per week. Aakash did well with weight loss. He denies hunger throughout the day and is not always getting all of his snack calories. His weight is 233 lb (105.7 kg) today and has had a weight loss of 2 pounds over a period of 4 weeks since his last visit. He has lost 30 lbs since starting treatment with [name].  Diabetes II Zaelyn has a diagnosis of diabetes type II and is on metformin. [place] states fasting blood sugars range between 82 and 117. He denies any nausea, vomiting, diarrhea, or hypoglycemic episodes. Last A1c was 5.7 at the [facility]. He has been working on intensive lifestyle modifications including diet, exercise, and weight loss to help control his blood glucose levels.  Vitamin D deficiency Jarin has a diagnosis of Vitamin D deficiency. He is currently taking OTC Vit D and denies nausea, vomiting or muscle weakness.  ASSESSMENT AND PLAN:  Type 2 diabetes mellitus without complication, without long-term current use of insulin (HCC)  Vitamin D deficiency  Class 1 obesity with serious comorbidity and body mass index (BMI) of 34.0 to 34.9 in adult, unspecified obesity type  PLAN:  Diabetes II Edrian has been given extensive diabetes education by myself today including ideal fasting and post-prandial blood glucose readings, individual ideal Hgb A1c goals  and hypoglycemia prevention. We discussed the importance of good blood sugar control to decrease the likelihood of diabetic complications such as nephropathy, neuropathy, limb loss, blindness, coronary artery disease, and death. We discussed the importance of intensive lifestyle modification including diet, exercise and weight loss as the  first line treatment for diabetes. Bartow agrees to continue his metformin and will follow-up at the agreed upon time.  Vitamin D Deficiency Lashun was informed that low Vitamin D levels contributes to fatigue and are associated with obesity, breast, and colon cancer. He agrees to continue taking OTC Vit D and will follow-up for routine testing of Vitamin D, at least 2-3 times per year. He was informed of the risk of over-replacement of Vitamin D and agrees to not increase her dose unless she discusses this with [name] first. Dannielle Huh agrees to follow-up with our clinic in 2 weeks.  Obesity Balraj is currently in the action stage of change. As such, his goal is to continue with weight loss efforts. He has agreed to follow the Category 3 plan. Kelechi has been instructed to work up to a goal of 150 minutes of combined cardio and strengthening exercise per week for weight loss and overall health benefits. We discussed the following Behavioral Modification Strategies today: increasing lean protein intake and work on meal planning and easy cooking plans.  Quayshawn has agreed to follow-up with our clinic in 3-4 weeks. He was informed of the importance of frequent follow up visits to maximize his success with intensive lifestyle modifications for his multiple health conditions.  ALLERGIES: Allergies  Allergen Reactions  . Shellfish Allergy Itching and Nausea And Vomiting    Shrimp     MEDICATIONS: Current Outpatient Medications on File Prior to Visit  Medication Sig Dispense Refill  . amLODipine (NORVASC) 10 MG tablet Take 10 mg by mouth daily.     . benazepril (LOTENSIN) 40  MG tablet Take 40 mg by mouth daily.    . blood glucose meter kit and supplies KIT Dispense based on patient and insurance preference. Use daily as directed to monitor blood sugar. (FOR ICD-9 250.00, 250.01). 1 each 0  . brimonidine (ALPHAGAN) 0.15 % ophthalmic solution Place 1 drop into both eyes 2 (two) times daily at 10 AM and 5  PM.    . cetirizine (ZYRTEC) 10 MG tablet Take 10 mg by mouth daily.    . cholecalciferol (VITAMIN D) 1000 UNITS tablet Take 1,000 Units by mouth daily.    [name] desonide (DESOWEN) 0.05 % cream Apply topically 2 (two) times daily.    . dorzolamide-timolol (COSOPT) 22.3-6.8 MG/ML ophthalmic solution Place 1 drop into both eyes 2 (two) times daily.     [name] gabapentin (NEURONTIN) 300 MG capsule Take 300 mg by mouth at bedtime.    [name] ketoconazole (NIZORAL) 2 % cream Apply 1 application topically daily.    . metFORMIN (GLUCOPHAGE-XR) 500 MG 24 hr tablet Take 1 tablet (500 mg total) by mouth daily with breakfast. 90 tablet 1  . moxifloxacin (VIGAMOX) 0.5 % ophthalmic solution 1 drop 3 (three) times daily.    [name] omeprazole (PRILOSEC) 20 MG capsule Take 20 mg by mouth at bedtime.    [name] PRESCRIPTION MEDICATION Apply 1 application topically daily. Cream to feet    . PRESCRIPTION MEDICATION Apply 1 application topically daily. Cream to back    . RHOPRESSA 0.02 % SOLN INT 1 GTT IN OU Q NIGHT  3  . sildenafil (VIAGRA) 100 MG tablet Take 100 mg by mouth daily as needed for erectile dysfunction.    . simvastatin (ZOCOR) 20 MG tablet Take 10 mg by mouth at bedtime.    . terbinafine (LAMISIL) 1 % cream Apply 1 application topically 2 (two) times daily.    [name] triamcinolone ointment (KENALOG) 0.1 % Apply 1 application topically 2 (two) times daily.    [name] triamterene-hydrochlorothiazide (MAXZIDE) 75-50 MG per tablet Take 0.5 tablets by mouth daily.    . Vitamin D, Ergocalciferol, 50 MCG (2000 UT) CAPS Take 1 tablet by mouth daily. 30 capsule 0   No current facility-administered medications on file prior to visit.     PAST MEDICAL HISTORY: Past Medical History:  Diagnosis Date  . Arthritis   . Chronic back pain   . Chronic kidney disease   . Cough   . Diabetes mellitus type II, controlled ([place])   . Dry skin   . Floaters in visual field   . GERD (gastroesophageal reflux disease)   . Glaucoma, both eyes   . Hay  fever   . Heartburn   . Hyperlipemia   . Hypertension   . Itching   . Joint pain   . Kidney disease   . Lactose intolerance   . Leg cramp   . Low back pain   . Multiple food allergies   . Muscle pain   . Neuropathy    rt leg from DDD  . Rash   . Sleep apnea    uses a cpap  . Stiff neck   . Swelling of lower extremity   . Trouble in sleeping   . Vision changes   . Vitamin D deficiency   . Wears glasses   . Wears partial dentures    top mid partial-flipper    PAST SURGICAL HISTORY: Past Surgical History:  Procedure Laterality Date  . COLONOSCOPY    . DENTAL SURGERY  extractions  . EYE SURGERY    . ORIF PATELLA Right 01/24/2013   Procedure: OPEN REDUCTION INTERNAL (ORIF) FIXATION PATELLA;  Surgeon: Kerin Salen, MD;  Location: [facility];  Service: Orthopedics;  Laterality: Right;  orif right patella,patellectomy     SOCIAL HISTORY: Social History   Tobacco Use  . Smoking status: Never Smoker  . Smokeless tobacco: Never Used  Substance Use Topics  . Alcohol use: No  . Drug use: No    FAMILY HISTORY: Family History  Problem Relation Age of Onset  . Arthritis Mother   . Hearing loss Mother   . Hyperlipidemia Mother   . Hypertension Mother   . Diabetes Mother   . Cancer Father   . Hypertension Father   . Diabetes Brother   . Hyperlipidemia Brother   . Hypertension Brother    ROS: Review of Systems  Constitutional: Positive for weight loss.  Gastrointestinal: Negative for nausea and vomiting.  Musculoskeletal:       Negative for muscle weakness.  Endo/Heme/Allergies:       Negative for hypoglycemia.   PHYSICAL EXAM: Blood pressure 112/71, pulse (!) 56, temperature 98.4 F (36.9 C), temperature source Oral, height '5\' 9"'$  (1.753 m), weight 233 lb (105.7 kg), SpO2 100 %. Body mass index is 34.41 kg/m. Physical Exam Vitals signs reviewed.  Constitutional:      Appearance: Normal appearance. He is obese.  Cardiovascular:     Rate  and Rhythm: Normal rate.     Pulses: Normal pulses.  Pulmonary:     Effort: Pulmonary effort is normal.     Breath sounds: Normal breath sounds.  Musculoskeletal: Normal range of motion.  Skin:    General: Skin is warm and dry.  Neurological:     Mental Status: He is alert and oriented to person, place, and time.  Psychiatric:        Behavior: Behavior normal.   RECENT LABS AND TESTS: BMET    Component Value Date/Time   NA 137 09/13/2018 0845   K 3.7 09/13/2018 0845   CL 102 09/13/2018 0845   CO2 27 09/13/2018 0845   GLUCOSE 99 09/13/2018 0845   BUN 24 (H) 09/13/2018 0845   CREATININE 1.17 09/13/2018 0845   CALCIUM 10.0 09/13/2018 0845   Lab Results  Component Value Date   HGBA1C 6.2 (H) 05/08/2018   HGBA1C 7.6 (H) 01/27/2018   Lab Results  Component Value Date   INSULIN 19.3 05/08/2018   CBC    Component Value Date/Time   WBC 5.2 01/20/2018 1057   RBC 4.28 01/20/2018 1057   HGB 13.0 01/20/2018 1057   HCT 37.9 (L) 01/20/2018 1057   PLT 188.0 01/20/2018 1057   MCV 88.7 01/20/2018 1057   MCHC 34.4 01/20/2018 1057   RDW 13.6 01/20/2018 1057   Iron/TIBC/Ferritin/ %Sat No results found for: IRON, TIBC, FERRITIN, IRONPCTSAT Lipid Panel     Component Value Date/Time   CHOL 162 05/08/2018 1043   TRIG 123 05/08/2018 1043   HDL 42 05/08/2018 1043   LDLCALC 95 05/08/2018 1043   Hepatic Function Panel     Component Value Date/Time   PROT 6.7 01/20/2018 1057   ALBUMIN 4.0 01/20/2018 1057   AST 21 01/20/2018 1057   ALT 24 01/20/2018 1057   ALKPHOS 61 01/20/2018 1057   BILITOT 0.7 01/20/2018 1057      Component Value Date/Time   TSH 2.28 09/13/2018 0845   TSH 2.670 05/08/2018 1043   TSH 1.75 01/20/2018  1057    Ref. Range 05/08/2018 10:43  Vitamin D, 25-Hydroxy Latest Ref Range: 30.0 - 100.0 ng/mL 45.5   OBESITY BEHAVIORAL INTERVENTION VISIT  Today's visit was #8  Starting weight: 263 lbs Starting date: 05/08/2018 Today's weight: 233 lbs  Today's  date: 10/05/2018 Total lbs lost to date: 30 At least 15 minutes were spent on discussing the following behavioral intervention visit.     10/05/2018  Height '5\' 9"'$  (1.753 m)  Weight 233 lb (105.7 kg)  BMI (Calculated) 34.39  BLOOD PRESSURE - SYSTOLIC 574  BLOOD PRESSURE - DIASTOLIC 71   Body Fat % 73.4 %  Total Body Water (lbs) 108.4 lbs   ASK: We discussed the diagnosis of obesity with [name] today and Broc agreed to give [name] permission to discuss obesity behavioral modification therapy today.  ASSESS: Joran has the diagnosis of obesity and his BMI today is 34.39. Taji is in the action stage of change.   ADVISE: Geza was educated on the multiple health risks of obesity as well as the benefit of weight loss to improve his health. He was advised of the need for long term treatment and the importance of lifestyle modifications to improve his current health and to decrease his risk of future health problems.  AGREE: Multiple dietary modification options and treatment options were discussed and  Deepak agreed to follow the recommendations documented in the above note.  ARRANGE: Gamble was educated on the importance of frequent visits to treat obesity as outlined per CMS and USPSTF guidelines and agreed to schedule his next follow up appointment today.  Migdalia Dk, am acting as transcriptionist for Abby Potash, PA-C I, Abby Potash, PA-C have reviewed above note and agree with its content

## 2018-10-19 ENCOUNTER — Encounter: Payer: Self-pay | Admitting: Neurology

## 2018-10-23 ENCOUNTER — Ambulatory Visit (INDEPENDENT_AMBULATORY_CARE_PROVIDER_SITE_OTHER): Payer: Medicare Other | Admitting: Neurology

## 2018-10-23 ENCOUNTER — Other Ambulatory Visit: Payer: Self-pay

## 2018-10-23 ENCOUNTER — Encounter: Payer: Self-pay | Admitting: Neurology

## 2018-10-23 VITALS — BP 132/85 | HR 59 | Ht 70.0 in | Wt 238.0 lb

## 2018-10-23 DIAGNOSIS — G4733 Obstructive sleep apnea (adult) (pediatric): Secondary | ICD-10-CM | POA: Diagnosis not present

## 2018-10-23 DIAGNOSIS — Z9989 Dependence on other enabling machines and devices: Secondary | ICD-10-CM

## 2018-10-23 NOTE — Progress Notes (Signed)
Subjective:    Patient ID: Tanner Young is a 63 y.o. male.  HPI     Interim history:   Tanner Young is a 63 year old right-handed gentleman with an underlying medical history of hypertension, reflux disease, chronic low back pain, arthritis, glaucoma, and obesity, who presents for follow-up consultation of his obstructive sleep apnea, after recent sleep study testing and starting CPAP therapy. The patient is unaccompanied today. I last saw him on 11/21/2017, at which time we talked about his sleep study results including his split-night sleep study from 09/15/2017 as well as his separate CPAP titration study on 10/18/2017. Tanner Young was on CPAP. Tanner Young was compliant with it. Tanner Young reported feeling better. Tanner Young was less sleepy during the day, Tanner Young was using Norco as needed.   Tanner Young saw Cecille Rubin, nurse practitioner in the interim on 05/23/2018 at which time Tanner Young was compliant with treatment.   Today, 10/23/2018: I reviewed his CPAP compliance data from 09/20/2018 through 10/19/2018 which is a total of 30 days, during which time Tanner Young used his CPAP 26 days with percent used days greater than 4 hours at 83%, indicating very good compliance with an average usage of 5 hours and 12 minutes, residual AHI at goal at 2.2 per hour, leak acceptable with the 95th percentile at 9.4 L/m on a pressure of 16 cm with EPR of 3. Tanner Young reports having lost quite a bit of weight. Tanner Young estimates that Tanner Young has lost 80-100 pounds. Compared to December 2018 his weight on our scales is approximately 62 pounds less. Tanner Young indicates that Tanner Young sleeps fairly well without CPAP. Tanner Young is wondering about retesting for sleep apnea. Tanner Young continues to be compliant with treatment, Tanner Young is going to continue to work on weight loss. His A1c has come down. Latest numbers not visible to me but per them was less than 6 (5.7 per wife) at the [place] recently.  The patient's allergies, current medications, family history, past medical history, past social history, past surgical history  and problem list were reviewed and updated as appropriate.    Previously:     I first met him on 08/02/2017 at the request of his dentist, at which time Tanner Young reported a prior diagnosis of obstructive sleep apnea but Tanner Young does not using CPAP. Tanner Young was invited for sleep study testing. Tanner Young had a split-night sleep study, followed by a full night titration study. I went over his test results with him in detail today. Split-night sleep study from 09/15/2017 showed a baseline sleep efficiency of 92%, sleep latency was 4 minutes, REM sleep was absent. Tanner Young had a elevated AHI of 70 per hour, average oxygen saturation of 96%, nadir was 73%. Tanner Young was then fitted with a medium fullface mask. CPAP was started at 5 cm and advanced to 18 cm. Tanner Young did not achieve any sleep on the final pressure, on a treatment pressure of 16 cm his AHI was highly elevated. Since Tanner Young did not have a fully successful titration portion of the study, Tanner Young was advised to return for a full night CPAP titration test separately. Tanner Young had this on 10/18/2017, sleep efficiency was 90.4%, sleep latency 7.5 minutes and REM latency was 46.5 minutes. CPAP was initiated at 8 cm and titrated to 16 cm. On the final titration pressure his AHI was 0 per hour, supine REM sleep was achieved and with a nadir was 90%. Tanner Young had no slow-wave sleep during the study but REM sleep was 25.4%. Tanner Young had no significant PLMS. Based on his test results  Tanner Young was advised to start home CPAP therapy at a pressure of 16 cm. We cautioned him regarding ongoing use of narcotic pain medication and the importance of losing weight to reduce the severity of his sleep-disordered breathing.    I reviewed his CPAP compliance data from 10/19/2017 to 11/17/2017 which is a total of 30 days, during which time Tanner Young used his CPAP 30 days with percent used days greater than 4 hours at 100%, indicating superb compliance with an average usage of 7 hours and 10 minutes, residual AHI at goal at 3.4 per hour, leak acceptable  with the 95th percentile at 11.7 L/m on a pressure of 16 cm with EPR of 3.    08/02/2017: (Tanner Young) was previously diagnosed with obstructive sleep apnea. Prior sleep study results are not available for my review today. I reviewed your office notes, which you kindly included. Tanner Young has been on CPAP therapy, but has not been using his machine very often. A CPAP download was reviewed today from 06/28/2016 through 06/22/2017 which is a total of one year during which time Tanner Young used his CPAP 4 days, indicating noncompliance, pressure set at 16 cm. Leak was very high with the 95th percentile at 90.9 L/m. Residual AHI at 4.5 per hour. His Epworth sleepiness score is 15 out of 24 today, fatigue score is 44 out of 63. Tanner Young is married and lives with his wife. They have 5 children. Tanner Young is a nonsmoker and drinks alcohol in the form of wine occasionally. Tanner Young drinks caffeine in the form of tea. Tanner Young has no telltale hx of RLS symptoms. No FHx known of OSA to him. Tanner Young does not wake up rested and per wife is very restless, has breathing pauses and very loud snoring, which bothers her. Tanner Young has no night to night nocturia. Tanner Young does not typically have morning headaches. Tanner Young has been having allergies and congestion and difficulty breathing through his nose, Tanner Young has had drainage. Tanner Young has most of his care through the [place] and has seen an allergy specialist as well. As far as his CPAP, Tanner Young eventually gave up on it because it was difficult for him to use it, the mask was leaking. Tanner Young believes that Tanner Young had sleep study testing nearly 10 years ago. His current machine is at least 65 years old. Tanner Young has used a wide Mirage Fx nasal mask.   His Past Medical History Is Significant For: Past Medical History:  Diagnosis Date  . Arthritis   . Chronic back pain   . Chronic kidney disease   . Cough   . Diabetes mellitus type II, controlled ([place])   . Dry skin   . Floaters in visual field   . GERD (gastroesophageal reflux disease)   . Glaucoma, both eyes   . Hay fever    . Heartburn   . Hyperlipemia   . Hypertension   . Itching   . Joint pain   . Kidney disease   . Lactose intolerance   . Leg cramp   . Low back pain   . Multiple food allergies   . Muscle pain   . Neuropathy    rt leg from DDD  . Rash   . Sleep apnea    uses a cpap  . Stiff neck   . Swelling of lower extremity   . Trouble in sleeping   . Vision changes   . Vitamin D deficiency   . Wears glasses   . Wears partial dentures    top mid  partial-flipper    His Past Surgical History Is Significant For: Past Surgical History:  Procedure Laterality Date  . COLONOSCOPY    . DENTAL SURGERY     extractions  . EYE SURGERY    . ORIF PATELLA Right 01/24/2013   Procedure: OPEN REDUCTION INTERNAL (ORIF) FIXATION PATELLA;  Surgeon: Kerin Salen, MD;  Location: [facility];  Service: Orthopedics;  Laterality: Right;  orif right patella,patellectomy     His Family History Is Significant For: Family History  Problem Relation Age of Onset  . Arthritis Mother   . Hearing loss Mother   . Hyperlipidemia Mother   . Hypertension Mother   . Diabetes Mother   . Cancer Father   . Hypertension Father   . Diabetes Brother   . Hyperlipidemia Brother   . Hypertension Brother     His Social History Is Significant For: Social History   Socioeconomic History  . Marital status: Married    Spouse name: Runner, broadcasting/film/video  . Number of children: Not on file  . Years of education: Not on file  . Highest education level: Not on file  Occupational History  . Occupation: Retired  Scientific laboratory technician  . Financial resource strain: Not on file  . Food insecurity:    Worry: Not on file    Inability: Not on file  . Transportation needs:    Medical: Not on file    Non-medical: Not on file  Tobacco Use  . Smoking status: Never Smoker  . Smokeless tobacco: Never Used  Substance and Sexual Activity  . Alcohol use: No  . Drug use: No  . Sexual activity: Not on file  Lifestyle  . Physical  activity:    Days per week: Not on file    Minutes per session: Not on file  . Stress: Not on file  Relationships  . Social connections:    Talks on phone: Not on file    Gets together: Not on file    Attends religious service: Not on file    Active member of club or organization: Not on file    Attends meetings of clubs or organizations: Not on file    Relationship status: Not on file  Other Topics Concern  . Not on file  Social History Narrative  . Not on file    His Allergies Are:  Allergies  Allergen Reactions  . Shellfish Allergy Itching and Nausea And Vomiting    Shrimp   :   His Current Medications Are:  Outpatient Encounter Medications as of 10/23/2018  Medication Sig  . amLODipine (NORVASC) 10 MG tablet Take 10 mg by mouth daily.   . benazepril (LOTENSIN) 40 MG tablet Take 40 mg by mouth daily.  . blood glucose meter kit and supplies KIT Dispense based on patient and insurance preference. Use daily as directed to monitor blood sugar. (FOR ICD-9 250.00, 250.01).  . brimonidine (ALPHAGAN) 0.15 % ophthalmic solution Place 1 drop into both eyes 2 (two) times daily at 10 AM and 5 PM.  . cetirizine (ZYRTEC) 10 MG tablet Take 10 mg by mouth daily.  . cholecalciferol (VITAMIN D) 1000 UNITS tablet Take 1,000 Units by mouth daily.  [name] desonide (DESOWEN) 0.05 % cream Apply topically 2 (two) times daily.  . dorzolamide-timolol (COSOPT) 22.3-6.8 MG/ML ophthalmic solution Place 1 drop into both eyes 2 (two) times daily.   [name] gabapentin (NEURONTIN) 300 MG capsule Take 300 mg by mouth at bedtime.  [name] ketoconazole (NIZORAL) 2 % cream  Apply 1 application topically daily.  . metFORMIN (GLUCOPHAGE-XR) 500 MG 24 hr tablet Take 1 tablet (500 mg total) by mouth daily with breakfast.  . moxifloxacin (VIGAMOX) 0.5 % ophthalmic solution 1 drop 3 (three) times daily.  [name] omeprazole (PRILOSEC) 20 MG capsule Take 20 mg by mouth at bedtime.  [name] PRESCRIPTION MEDICATION Apply 1 application topically  daily. Cream to feet  . PRESCRIPTION MEDICATION Apply 1 application topically daily. Cream to back  . RHOPRESSA 0.02 % SOLN INT 1 GTT IN OU Q NIGHT  . sildenafil (VIAGRA) 100 MG tablet Take 100 mg by mouth daily as needed for erectile dysfunction.  . simvastatin (ZOCOR) 20 MG tablet Take 10 mg by mouth at bedtime.  . terbinafine (LAMISIL) 1 % cream Apply 1 application topically 2 (two) times daily.  [name] triamcinolone ointment (KENALOG) 0.1 % Apply 1 application topically 2 (two) times daily.  [name] triamterene-hydrochlorothiazide (MAXZIDE) 75-50 MG per tablet Take 0.5 tablets by mouth daily.  . Vitamin D, Ergocalciferol, 50 MCG (2000 UT) CAPS Take 1 tablet by mouth daily.   No facility-administered encounter medications on file as of 10/23/2018.   :  Review of Systems:  Out of a complete 14 point review of systems, all are reviewed and negative with the exception of these symptoms as listed below: Review of Systems  Neurological:       Pt presents today to discuss his cpap. Pt reports that Tanner Young has lost almost 80-100 lbs. Tanner Young is wondering if Tanner Young should be retested for osa. Tanner Young reports that Tanner Young sleeps better without the cpap.    Objective:  Neurological Exam  Physical Exam Physical Examination:   Vitals:   10/23/18 1345  BP: 132/85  Pulse: (!) 59    General Examination: The patient is a very pleasant 62 y.o. male in no acute distress. Tanner Young appears well-developed and well-nourished and well groomed.   HEENT:Normocephalic, atraumatic, pupils are equal, round and reactive to light and accommodation. Tanner Young wears corrective eyeglasses. Tanner Young had recent eye surgery on the right with stent/tubeplacement for glaucoma. Extraocular tracking is good without limitation to gaze excursion or nystagmus noted. Normal smooth pursuit is noted. Hearing is grossly intact. Face is symmetric with normal facial animation and normal facial sensation. Speech is clear with no dysarthria noted. There is no hypophonia. There is no  lip, neck/head, jaw or voice tremor. Neck is supple with full range of passive and active motion. There are no carotid bruits on auscultation. Oropharynx exam reveals:no significant mouth dryness, adequatedental hygiene and moderateairway crowding. Mallampati is classII. Tongue protrudes centrally and palate elevates symmetrically.  Chest:Clear to auscultation without wheezing, rhonchi or crackles noted.  Heart:S1+S2+0, regular and normal without murmurs, rubs or gallops noted.   Abdomen:Soft, non-tender and non-distended with normal bowel sounds appreciated on auscultation.  Extremities:There isnoobvious change.   Skin: Warm and dry without trophic changes noted.  Musculoskeletal: exam reveals no obvious joint deformities, tenderness or joint swelling or erythema, with the exception of hardware in place right knee, no obvious change.    Neurologically:  Mental status: The patient is awake, alert and oriented in all 4 spheres.Hisimmediate and remote memory, attention, language skills and fund of knowledge are appropriate. There is no evidence of aphasia, agnosia, apraxia or anomia. Speech is clear with normal prosody and enunciation. Thought process is linear. Mood is normaland affect is normal.  Cranial nerves II - XII are as described above under HEENT exam.  Motor exam: Normal bulk, strength and tone is noted.  There is no drift, tremor or rebound. Romberg is negative. Reflexes are 2+ throughout. Fine motor skills and coordination: intact with normal finger taps, normal hand movements, normal rapid alternating patting, normal foot taps and normal foot agility.  Cerebellar testing: No dysmetria or intention tremor. There is no truncal or gait ataxia.  Sensory exam: intact to light touch in the upper and lower extremities.  Gait, station and balance:Hestands easily. No veering to one side is noted. No leaning to one side is noted. Posture is age-appropriate and stance is  narrow based. Gait showsnormalstride length and normalpace. No problems turning are noted.  Assessmentand Plan:  In summary,Tanner R Watsonis a very pleasant 51 year oldmalewith an underlying medical history of hypertension, reflux disease, chronic low back pain, arthritis, glaucoma, and obesity, whopresents for follow-up consultation of his obstructive sleep apnea. Tanner Young had difficulty with his prior CPAP in the past. Tanner Young had a split-night sleep study in January 2019 and a subsequent full night titration study in March 2019. Tanner Young Continues to be compliant with his CPAP. Tanner Young has lost weight and is encouraged to continue to pursue weight loss, I would suggest we continue with CPAP, I did offer him a pressure reduction blood pressure does not bother him. Tanner Young is willing to continue with the current settings and we mutually agreed to reevaluate things in 6 months, at that time if Tanner Young has lost more weight we can certainly reevaluate his sleep apnea diagnosis with another sleep study. I answered alltheirquestions today and the patient and his wifewerein agreement. I spent 20 minutes in total face-to-face time with the patient, more than 50% of which was spent in counseling and coordination of care, reviewing test results, reviewing medication and discussing or reviewing the diagnosis of OSA, its prognosis and treatment options. Pertinent laboratory and imaging test results that were available during this visit with the patient were reviewed by me and considered in my medical decision making (see chart for details).

## 2018-10-23 NOTE — Patient Instructions (Addendum)
Please continue using your CPAP regularly. While your insurance requires that you use CPAP at least 4 hours each night on 70% of the nights, I recommend, that you not skip any nights and use it throughout the night if you can. Getting used to CPAP and staying with the treatment long term does take time and patience and discipline. Untreated obstructive sleep apnea when it is moderate to severe can have an adverse impact on cardiovascular health and raise her risk for heart disease, arrhythmias, hypertension, congestive heart failure, stroke and diabetes. Untreated obstructive sleep apnea causes sleep disruption, nonrestorative sleep, and sleep deprivation. This can have an impact on your day to day functioning and cause daytime sleepiness and impairment of cognitive function, memory loss, mood disturbance, and problems focussing. Using CPAP regularly can improve these symptoms.  Keep up the good work! I will see you back in 6 months for sleep apnea check up, and if you continue to do well with your weight loss, we may consider repeating your sleep study.

## 2018-11-02 ENCOUNTER — Ambulatory Visit (INDEPENDENT_AMBULATORY_CARE_PROVIDER_SITE_OTHER): Payer: Medicare Other | Admitting: Physician Assistant

## 2018-11-06 ENCOUNTER — Telehealth: Payer: Self-pay

## 2018-11-06 NOTE — Telephone Encounter (Signed)
Copied from [facility] [id number]. Topic: General - Inquiry >> Nov 06, 2018 10:53 AM Alanda Slim E wrote: Reason for CRM: Pt bought over paperwork that needed to be filled out for the cancellation of a trip. Pt wanted to see if form was filled out by Dr. Zigmund Daniel / please advise

## 2018-11-06 NOTE — Telephone Encounter (Signed)
Forms completed

## 2018-11-06 NOTE — Telephone Encounter (Signed)
Called Pt. To inform that paperwork is completed . Pt requested for paperwork to be mailed. Request completed. Copied paperwork for chart.

## 2018-11-08 ENCOUNTER — Ambulatory Visit: Payer: Medicare Other | Admitting: Family Medicine

## 2018-11-09 ENCOUNTER — Encounter (INDEPENDENT_AMBULATORY_CARE_PROVIDER_SITE_OTHER): Payer: Self-pay

## 2018-12-19 ENCOUNTER — Telehealth (INDEPENDENT_AMBULATORY_CARE_PROVIDER_SITE_OTHER): Payer: Medicare Other | Admitting: Family Medicine

## 2018-12-19 ENCOUNTER — Telehealth: Payer: Medicare Other | Admitting: Physician Assistant

## 2018-12-19 ENCOUNTER — Encounter: Payer: Self-pay | Admitting: Family Medicine

## 2018-12-19 DIAGNOSIS — I10 Essential (primary) hypertension: Secondary | ICD-10-CM | POA: Diagnosis not present

## 2018-12-19 DIAGNOSIS — E119 Type 2 diabetes mellitus without complications: Secondary | ICD-10-CM | POA: Diagnosis not present

## 2018-12-19 DIAGNOSIS — E785 Hyperlipidemia, unspecified: Secondary | ICD-10-CM | POA: Diagnosis not present

## 2018-12-19 DIAGNOSIS — K219 Gastro-esophageal reflux disease without esophagitis: Secondary | ICD-10-CM

## 2018-12-19 DIAGNOSIS — M5410 Radiculopathy, site unspecified: Secondary | ICD-10-CM

## 2018-12-19 MED ORDER — CYCLOBENZAPRINE HCL 10 MG PO TABS: 5.0000 mg | ORAL_TABLET | Freq: Three times a day (TID) | ORAL | 0 refills | Status: DC | PRN

## 2018-12-19 NOTE — Assessment & Plan Note (Signed)
-  Diabetes has been well controlled with recent a1c of 5.8% -Continue metformin and encouraged to keep up lifestyle changes.

## 2018-12-19 NOTE — Progress Notes (Signed)
Tanner Young - 63 y.o. male MRN [id number]  Date of birth: 1955/08/18   This visit type was conducted due to national recommendations for restrictions regarding the COVID-19 Pandemic (e.g. social distancing).  This format is felt to be most appropriate for this patient at this time.  All issues noted in this document were discussed and addressed.  No physical exam was performed (except for noted visual exam findings with Video Visits).  I discussed the limitations of evaluation and management by telemedicine and the availability of in person appointments. The patient expressed understanding and agreed to proceed.  I connected with@ on 12/19/18 at  9:00 AM EDT by a video enabled telemedicine application and verified that I am speaking with the correct person using two identifiers.   Patient Location: Home Tanner Young [id number]   Provider location:   Home office  Chief Complaint  Patient presents with  . Follow-up    HTN , meds - meds working,    HPI  Tanner Young is a 63 y.o. male who presents via audio/video conferencing for a telehealth visit today.  He is following up today for T2DM, HTN, HLD and GERD.  He continues to participate in dual care through the [place] and has medications filled through [facility].   -HTN:  Current management with maxzide, amlodipine and benazepril.  BP has been well controlled at home.  He has lost some weight with lifestyle change including healthier eating and walking more.  He denies chest pain, shortness of breath, palpitations, headache or vision changes.   -T2DM:  He had a recent A1c through the [place] that was down to 5.8%.  He continue on metformin with lifestyle changes.  He denies any symptoms related to diabetes at this time.   -HLD:  Tolerating sinvastatin well, denies myalgias or GI upset.   -GERD:  Well controlled with omeprazole.  Has improved some with weight loss as well.    ROS:  A comprehensive ROS was completed and  negative except as noted per HPI  Past Medical History:  Diagnosis Date  . Arthritis   . Chronic back pain   . Chronic kidney disease   . Cough   . Diabetes mellitus type II, controlled ([place])   . Dry skin   . Floaters in visual field   . GERD (gastroesophageal reflux disease)   . Glaucoma, both eyes   . Hay fever   . Heartburn   . Hyperlipemia   . Hypertension   . Itching   . Joint pain   . Kidney disease   . Lactose intolerance   . Leg cramp   . Low back pain   . Multiple food allergies   . Muscle pain   . Neuropathy    rt leg from DDD  . Rash   . Sleep apnea    uses a cpap  . Stiff neck   . Swelling of lower extremity   . Trouble in sleeping   . Vision changes   . Vitamin D deficiency   . Wears glasses   . Wears partial dentures    top mid partial-flipper    Past Surgical History:  Procedure Laterality Date  . COLONOSCOPY    . DENTAL SURGERY     extractions  . EYE SURGERY    . ORIF PATELLA Right 01/24/2013   Procedure: OPEN REDUCTION INTERNAL (ORIF) FIXATION PATELLA;  Surgeon: Kerin Salen, MD;  Location: [facility];  Service: Orthopedics;  Laterality: Right;  orif right patella,patellectomy     Family History  Problem Relation Age of Onset  . Arthritis Mother   . Hearing loss Mother   . Hyperlipidemia Mother   . Hypertension Mother   . Diabetes Mother   . Cancer Father   . Hypertension Father   . Diabetes Brother   . Hyperlipidemia Brother   . Hypertension Brother     Social History   Socioeconomic History  . Marital status: Married    Spouse name: Runner, broadcasting/film/video  . Number of children: Not on file  . Years of education: Not on file  . Highest education level: Not on file  Occupational History  . Occupation: Retired  Scientific laboratory technician  . Financial resource strain: Not on file  . Food insecurity:    Worry: Not on file    Inability: Not on file  . Transportation needs:    Medical: Not on file    Non-medical: Not on file  Tobacco Use   . Smoking status: Never Smoker  . Smokeless tobacco: Never Used  Substance and Sexual Activity  . Alcohol use: No  . Drug use: No  . Sexual activity: Not on file  Lifestyle  . Physical activity:    Days per week: Not on file    Minutes per session: Not on file  . Stress: Not on file  Relationships  . Social connections:    Talks on phone: Not on file    Gets together: Not on file    Attends religious service: Not on file    Active member of club or organization: Not on file    Attends meetings of clubs or organizations: Not on file    Relationship status: Not on file  . Intimate partner violence:    Fear of current or ex partner: Not on file    Emotionally abused: Not on file    Physically abused: Not on file    Forced sexual activity: Not on file  Other Topics Concern  . Not on file  Social History Narrative  . Not on file     Current Outpatient Medications:  .  amLODipine (NORVASC) 10 MG tablet, Take 10 mg by mouth daily. , Disp: , Rfl:  .  benazepril (LOTENSIN) 40 MG tablet, Take 40 mg by mouth daily., Disp: , Rfl:  .  blood glucose meter kit and supplies KIT, Dispense based on patient and insurance preference. Use daily as directed to monitor blood sugar. (FOR ICD-9 250.00, 250.01)., Disp: 1 each, Rfl: 0 .  brimonidine (ALPHAGAN) 0.15 % ophthalmic solution, Place 1 drop into both eyes 2 (two) times daily at 10 AM and 5 PM., Disp: , Rfl:  .  cetirizine (ZYRTEC) 10 MG tablet, Take 10 mg by mouth daily., Disp: , Rfl:  .  cholecalciferol (VITAMIN D) 1000 UNITS tablet, Take 1,000 Units by mouth daily., Disp: , Rfl:  .  desonide (DESOWEN) 0.05 % cream, Apply topically 2 (two) times daily., Disp: , Rfl:  .  dorzolamide-timolol (COSOPT) 22.3-6.8 MG/ML ophthalmic solution, Place 1 drop into both eyes 2 (two) times daily. , Disp: , Rfl:  .  gabapentin (NEURONTIN) 300 MG capsule, Take 300 mg by mouth at bedtime., Disp: , Rfl:  .  ketoconazole (NIZORAL) 2 % cream, Apply 1  application topically daily., Disp: , Rfl:  .  metFORMIN (GLUCOPHAGE-XR) 500 MG 24 hr tablet, Take 1 tablet (500 mg total) by mouth daily with breakfast., Disp: 90 tablet, Rfl: 1 .  moxifloxacin (VIGAMOX) 0.5 % ophthalmic solution, 1 drop 3 (three) times daily., Disp: , Rfl:  .  omeprazole (PRILOSEC) 20 MG capsule, Take 20 mg by mouth at bedtime., Disp: , Rfl:  .  PRESCRIPTION MEDICATION, Apply 1 application topically daily. Cream to feet, Disp: , Rfl:  .  PRESCRIPTION MEDICATION, Apply 1 application topically daily. Cream to back, Disp: , Rfl:  .  RHOPRESSA 0.02 % SOLN, INT 1 GTT IN OU Q NIGHT, Disp: , Rfl: 3 .  sildenafil (VIAGRA) 100 MG tablet, Take 100 mg by mouth daily as needed for erectile dysfunction., Disp: , Rfl:  .  simvastatin (ZOCOR) 20 MG tablet, Take 10 mg by mouth at bedtime., Disp: , Rfl:  .  terbinafine (LAMISIL) 1 % cream, Apply 1 application topically 2 (two) times daily., Disp: , Rfl:  .  triamcinolone ointment (KENALOG) 0.1 %, Apply 1 application topically 2 (two) times daily., Disp: , Rfl:  .  triamterene-hydrochlorothiazide (MAXZIDE) 75-50 MG per tablet, Take 0.5 tablets by mouth daily., Disp: , Rfl:  .  Vitamin D, Ergocalciferol, 50 MCG (2000 UT) CAPS, Take 1 tablet by mouth daily., Disp: 30 capsule, Rfl: 0 .  cyclobenzaprine (FLEXERIL) 10 MG tablet, Take 0.5-1 tablets (5-10 mg total) by mouth 3 (three) times daily as needed for muscle spasms., Disp: 30 tablet, Rfl: 0  EXAM:  VITALS per patient if applicable: Pulse (!) 53   Ht _0  (1.778 m)   Wt 230 lb (104.3 kg) Comment: pt reported  SpO2 98%   BMI 33.00 kg/m   GENERAL: alert, oriented, appears well and in no acute distress  HEENT: atraumatic, conjunttiva clear, no obvious abnormalities on inspection of external nose and ears  NECK: normal movements of the head and neck  LUNGS: on inspection no signs of respiratory distress, breathing rate appears normal, no obvious gross SOB, gasping or wheezing  CV: no  obvious cyanosis  MS: moves all visible extremities without noticeable abnormality  PSYCH/NEURO: pleasant and cooperative, no obvious depression or anxiety, speech and thought processing grossly intact  ASSESSMENT AND PLAN:  Discussed the following assessment and plan:  Essential hypertension -BP stable based on home readings.  -Recommend continuation of current medications.  -Had recent labs at [place], will bring in copy.   Type 2 diabetes mellitus without complication, without long-term current use of insulin (HCC) -Diabetes has been well controlled with recent a1c of 5.8% -Continue metformin and encouraged to keep up lifestyle changes.   HLD (hyperlipidemia) -Stable, doing well with simvastatin.   GERD (gastroesophageal reflux disease) -Stable and well controlled with omeprazole, recommend continuation        I discussed the assessment and treatment plan with the patient. The patient was provided an opportunity to ask questions and all were answered. The patient agreed with the plan and demonstrated an understanding of the instructions.   The patient was advised to call back or seek an in-person evaluation if the symptoms worsen or if the condition fails to improve as anticipated.    [name], DO

## 2018-12-19 NOTE — Assessment & Plan Note (Signed)
-  Stable and well controlled with omeprazole, recommend continuation

## 2018-12-19 NOTE — Assessment & Plan Note (Signed)
-  BP stable based on home readings.  -Recommend continuation of current medications.  -Had recent labs at [facility], will bring in copy.

## 2018-12-19 NOTE — Assessment & Plan Note (Signed)
-  Stable, doing well with simvastatin.

## 2018-12-19 NOTE — Progress Notes (Signed)
We are sorry that you are not feeling well.  Here is how we plan to help!  Based on what you have shared with me it looks like you mostly have acute back pain.  Acute back pain is defined as musculoskeletal pain that can resolve in 1-3 weeks with conservative treatment.  Please begin taking tylenol 1000 mg every 8 hours.  I have prescribed Flexeril 10 mg every eight hours as needed which is a muscle relaxer.  Some patients experience stomach irritation or in increased heartburn with anti-inflammatory drugs.  Please keep in mind that muscle relaxer's can cause fatigue and should not be taken while at work or driving.  Back pain is very common.  The pain often gets better over time.  The cause of back pain is usually not dangerous.  Most people can learn to manage their back pain on their own.  Home Care Stay active.  Start with short walks on flat ground if you can.  Try to walk farther each day. Do not sit, drive or stand in one place for more than 30 minutes.  Do not stay in bed. Do not avoid exercise or work.  Activity can help your back heal faster. Be careful when you bend or lift an object.  Bend at your knees, keep the object close to you, and do not twist. Sleep on a firm mattress.  Lie on your side, and bend your knees.  If you lie on your back, put a pillow under your knees. Only take medicines as told by your doctor. Put ice on the injured area. Put ice in a plastic bag Place a towel between your skin and the bag Leave the ice on for 15-20 minutes, 3-4 times a day for the first 2-3 days. 210 After that, you can switch between ice and heat packs. Ask your doctor about back exercises or massage. Avoid feeling anxious or stressed.  Find good ways to deal with stress, such as exercise.  Get Help Right Way If: Your pain does not go away with rest or medicine. Your pain does not go away in 1 week. You have new problems. You do not feel well. The pain spreads into your legs. You cannot  control when you poop (bowel movement) or pee (urinate) You feel sick to your stomach (nauseous) or throw up (vomit) You have belly (abdominal) pain. You feel like you may pass out (faint). If you develop a fever.  Make Sure you: Understand these instructions. Will watch your condition Will get help right away if you are not doing well or get worse.  Your e-visit answers were reviewed by a board certified advanced clinical practitioner to complete your personal care plan.  Depending on the condition, your plan could have included both over the counter or prescription medications.  If there is a problem please reply once you have received a response from your provider.  Your safety is important to [name].  If you have drug allergies check your prescription carefully.    You can use MyChart to ask questions about today's visit, request a non-urgent call back, or ask for a work or school excuse for 24 hours related to this e-Visit. If it has been greater than 24 hours you will need to follow up with your provider, or enter a new e-Visit to address those concerns.  You will get an e-mail in the next two days asking about your experience.  I hope that your e-visit has been valuable and will  speed your recovery. Thank you for using e-visits.   ===View-only below this line===   ----- Message -----    From: [name]    Sent: 12/19/2018  9:03 AM EDT      To: E-Visit Mailing List Subject: E-Visit Submission: Back Pain  E-Visit Submission: Back Pain --------------------------------  Question: Where are you having pain Answer:   Right lower back  Question: Does the pain extend into your legs? Answer:   No  Question: Are you having any numbness or weakness of the legs? Answer:   Yes  Question: Does this pain radiate to the abdomen or groin? Answer:   No  Question: How bad is the pain? Answer:   The pain is moderate  Question: Did you have an injury that caused the pain? Answer:    No, I cannot remember an injury  Question: How long has the pain been present? Answer:   More than 4 weeks  Question: Have you had back pain in the past? Answer:   Yes, I have many times had pain similiar to this before  Question: Do you have a history of fever associated with your back pain? Answer:   No  Question: Please list any medications you have previously taken for back pain. Answer:   No  Question: Do you have a fever? Answer:   No, I do not have a fever  Question: Do you have any of the following? Answer:   None of the above  Question: What makes the pain worse? Answer:   Strenuous activity  Question: What makes the pain better Answer:   Nothing makes it better  Question: Do you get relief with certain positions (even if only partial relief)? Answer:   Yes  Question: Have you ever been diagnosed with cancer? Answer:   No  Question: Have you ever been diagnosed with arthritis? Answer:   Yes  Question: Have you ever been diagnosed with osteoporosis or any other bone weakness? Answer:   No  Question: Have you ever had surgery on your back or spine? Answer:   No  Question: Do you have a history of Intravenous Drug Use? Answer:   No  Question: What is your usual health status? Answer:   My activity is physically restricted  Question: Please list your medication allergies that you may have ? (If 'none' , please list as 'none') Answer:   Already  Question: Please list any additional comments  Answer:     A total of 5-10 minutes was spent evaluating this patients questionnaire and formulating a plan of care.

## 2018-12-28 ENCOUNTER — Ambulatory Visit (INDEPENDENT_AMBULATORY_CARE_PROVIDER_SITE_OTHER): Payer: Medicare Other | Admitting: Family Medicine

## 2018-12-28 ENCOUNTER — Encounter (INDEPENDENT_AMBULATORY_CARE_PROVIDER_SITE_OTHER): Payer: Self-pay | Admitting: Family Medicine

## 2018-12-28 ENCOUNTER — Other Ambulatory Visit: Payer: Self-pay

## 2018-12-28 DIAGNOSIS — Z6834 Body mass index (BMI) 34.0-34.9, adult: Secondary | ICD-10-CM | POA: Diagnosis not present

## 2018-12-28 DIAGNOSIS — E669 Obesity, unspecified: Secondary | ICD-10-CM

## 2018-12-28 DIAGNOSIS — E119 Type 2 diabetes mellitus without complications: Secondary | ICD-10-CM

## 2019-01-02 NOTE — Progress Notes (Signed)
Office: [phone]  /  Fax: [phone] TeleHealth Visit:  Tanner Young has verbally consented to this TeleHealth visit today. The patient is located at home, the provider is located at the [facility] office. The participants in this visit include the listed provider, and [name]. The visit was conducted today via Face Time.  HPI:   Chief Complaint: OBESITY Tanner Young is here to discuss his progress with his obesity treatment plan. He is on the Category 3 plan and is following his eating plan approximately 60 to 70 % of the time. He states he is walking 25 minutes 3 to 4 times per week. [name] is currently struggling to follow his plan as closely due to grocery shortages. He thinks that he has gained 1 to 2 pounds since his last visit, but he is ready to get back on track.   We were unable to weigh the patient today for this TeleHealth visit. He feels as if he has gained weight since his last visit. He has lost 30 lbs since starting treatment with [name].  Diabetes II Kaidan has a diagnosis of diabetes type II. [name] states that his fasting blood sugars range between 87 and 107 on his diet prescription and metformin. His last A1c was 6.2 on 05/08/18. He has been working on intensive lifestyle modifications including diet, exercise, and weight loss to help control his blood glucose levels. [name] denies any nausea, vomiting, or hypoglycemic episodes.   ASSESSMENT AND PLAN:  Type 2 diabetes mellitus without complication, without long-term current use of insulin (HCC)  Class 1 obesity with serious comorbidity and body mass index (BMI) of 34.0 to 34.9 in adult, unspecified obesity type  PLAN:  Diabetes II [name] has been given extensive diabetes education by myself today including ideal fasting and post-prandial blood glucose readings, individual ideal Hgb A1c goals, and hypoglycemia prevention. We discussed the importance of good blood sugar control to decrease the likelihood of  diabetic complications such as nephropathy, neuropathy, limb loss, blindness, coronary artery disease, and death. We discussed the importance of intensive lifestyle modification including diet, exercise, and weight loss as the first line treatment for diabetes. We will check labs in 1 month. [name] agrees to continue his metformin and diet and will follow up at the agreed upon time in 2 weeks.   I spent > than 50% of the 15 minute visit on counseling as documented in the note.  Obesity [name] is currently in the action stage of change. As such, his goal is to continue with weight loss efforts. He has agreed to follow the Category 3 plan. [name] has been instructed to work up to a goal of 150 minutes of combined cardio and strengthening exercise per week for weight loss and overall health benefits. We discussed the following Behavioral Modification Strategies today: increasing lean protein intake and work on meal planning and easy cooking plans.  [name] has agreed to follow up with our clinic in 2 weeks. He was informed of the importance of frequent follow up visits to maximize his success with intensive lifestyle modifications for his multiple health conditions.  ALLERGIES: Allergies  Allergen Reactions  . Shellfish Allergy Itching and Nausea And Vomiting    Shrimp     MEDICATIONS: Current Outpatient Medications on File Prior to Visit  Medication Sig Dispense Refill  . amLODipine (NORVASC) 10 MG tablet Take 10 mg by mouth daily.     . benazepril (LOTENSIN) 40 MG tablet Take 40 mg by mouth daily.    [name]  blood glucose meter kit and supplies KIT Dispense based on patient and insurance preference. Use daily as directed to monitor blood sugar. (FOR ICD-9 250.00, 250.01). 1 each 0  . brimonidine (ALPHAGAN) 0.15 % ophthalmic solution Place 1 drop into both eyes 2 (two) times daily at 10 AM and 5 PM.    . cetirizine (ZYRTEC) 10 MG tablet Take 10 mg by mouth daily.    . cholecalciferol (VITAMIN D)  1000 UNITS tablet Take 1,000 Units by mouth daily.    . cyclobenzaprine (FLEXERIL) 10 MG tablet Take 0.5-1 tablets (5-10 mg total) by mouth 3 (three) times daily as needed for muscle spasms. 30 tablet 0  . desonide (DESOWEN) 0.05 % cream Apply topically 2 (two) times daily.    . dorzolamide-timolol (COSOPT) 22.3-6.8 MG/ML ophthalmic solution Place 1 drop into both eyes 2 (two) times daily.     [name] gabapentin (NEURONTIN) 300 MG capsule Take 300 mg by mouth at bedtime.    [name] ketoconazole (NIZORAL) 2 % cream Apply 1 application topically daily.    . metFORMIN (GLUCOPHAGE-XR) 500 MG 24 hr tablet Take 1 tablet (500 mg total) by mouth daily with breakfast. 90 tablet 1  . moxifloxacin (VIGAMOX) 0.5 % ophthalmic solution 1 drop 3 (three) times daily.    [name] omeprazole (PRILOSEC) 20 MG capsule Take 20 mg by mouth at bedtime.    [name] PRESCRIPTION MEDICATION Apply 1 application topically daily. Cream to feet    . PRESCRIPTION MEDICATION Apply 1 application topically daily. Cream to back    . RHOPRESSA 0.02 % SOLN INT 1 GTT IN OU Q NIGHT  3  . sildenafil (VIAGRA) 100 MG tablet Take 100 mg by mouth daily as needed for erectile dysfunction.    . simvastatin (ZOCOR) 20 MG tablet Take 10 mg by mouth at bedtime.    . terbinafine (LAMISIL) 1 % cream Apply 1 application topically 2 (two) times daily.    [name] triamcinolone ointment (KENALOG) 0.1 % Apply 1 application topically 2 (two) times daily.    [name] triamterene-hydrochlorothiazide (MAXZIDE) 75-50 MG per tablet Take 0.5 tablets by mouth daily.    . Vitamin D, Ergocalciferol, 50 MCG (2000 UT) CAPS Take 1 tablet by mouth daily. 30 capsule 0   No current facility-administered medications on file prior to visit.     PAST MEDICAL HISTORY: Past Medical History:  Diagnosis Date  . Arthritis   . Chronic back pain   . Chronic kidney disease   . Cough   . Diabetes mellitus type II, controlled ([place])   . Dry skin   . Floaters in visual field   . GERD (gastroesophageal reflux  disease)   . Glaucoma, both eyes   . Hay fever   . Heartburn   . Hyperlipemia   . Hypertension   . Itching   . Joint pain   . Kidney disease   . Lactose intolerance   . Leg cramp   . Low back pain   . Multiple food allergies   . Muscle pain   . Neuropathy    rt leg from DDD  . Rash   . Sleep apnea    uses a cpap  . Stiff neck   . Swelling of lower extremity   . Trouble in sleeping   . Vision changes   . Vitamin D deficiency   . Wears glasses   . Wears partial dentures    top mid partial-flipper    PAST SURGICAL HISTORY: Past Surgical History:  Procedure  Laterality Date  . COLONOSCOPY    . DENTAL SURGERY     extractions  . EYE SURGERY    . ORIF PATELLA Right 01/24/2013   Procedure: OPEN REDUCTION INTERNAL (ORIF) FIXATION PATELLA;  Surgeon: Kerin Salen, MD;  Location: [facility];  Service: Orthopedics;  Laterality: Right;  orif right patella,patellectomy     SOCIAL HISTORY: Social History   Tobacco Use  . Smoking status: Never Smoker  . Smokeless tobacco: Never Used  Substance Use Topics  . Alcohol use: No  . Drug use: No    FAMILY HISTORY: Family History  Problem Relation Age of Onset  . Arthritis Mother   . Hearing loss Mother   . Hyperlipidemia Mother   . Hypertension Mother   . Diabetes Mother   . Cancer Father   . Hypertension Father   . Diabetes Brother   . Hyperlipidemia Brother   . Hypertension Brother     ROS: Review of Systems  Gastrointestinal: Negative for nausea and vomiting.  Endo/Heme/Allergies:       Negative for hypoglycemia.    PHYSICAL EXAM: Pt in no acute distress  RECENT LABS AND TESTS: BMET    Component Value Date/Time   NA 137 09/13/2018 0845   K 3.7 09/13/2018 0845   CL 102 09/13/2018 0845   CO2 27 09/13/2018 0845   GLUCOSE 99 09/13/2018 0845   BUN 24 (H) 09/13/2018 0845   CREATININE 1.17 09/13/2018 0845   CALCIUM 10.0 09/13/2018 0845   Lab Results  Component Value Date   HGBA1C 6.2 (H)  05/08/2018   HGBA1C 7.6 (H) 01/27/2018   Lab Results  Component Value Date   INSULIN 19.3 05/08/2018   CBC    Component Value Date/Time   WBC 5.2 01/20/2018 1057   RBC 4.28 01/20/2018 1057   HGB 13.0 01/20/2018 1057   HCT 37.9 (L) 01/20/2018 1057   PLT 188.0 01/20/2018 1057   MCV 88.7 01/20/2018 1057   MCHC 34.4 01/20/2018 1057   RDW 13.6 01/20/2018 1057   Iron/TIBC/Ferritin/ %Sat No results found for: IRON, TIBC, FERRITIN, IRONPCTSAT Lipid Panel     Component Value Date/Time   CHOL 162 05/08/2018 1043   TRIG 123 05/08/2018 1043   HDL 42 05/08/2018 1043   LDLCALC 95 05/08/2018 1043   Hepatic Function Panel     Component Value Date/Time   PROT 6.7 01/20/2018 1057   ALBUMIN 4.0 01/20/2018 1057   AST 21 01/20/2018 1057   ALT 24 01/20/2018 1057   ALKPHOS 61 01/20/2018 1057   BILITOT 0.7 01/20/2018 1057      Component Value Date/Time   TSH 2.28 09/13/2018 0845   TSH 2.670 05/08/2018 1043   TSH 1.75 01/20/2018 1057   Results for CHANCELLOR, VANDERLOOP (MRN [id number]) as of 01/02/2019 08:33  Ref. Range 05/08/2018 10:43  Vitamin D, 25-Hydroxy Latest Ref Range: 30.0 - 100.0 ng/mL 45.5     I, Marcille Blanco, CMA, am acting as transcriptionist for Starlyn Skeans, MD I have reviewed the above documentation for accuracy and completeness, and I agree with the above. -Dennard Nip, MD

## 2019-01-17 ENCOUNTER — Encounter (INDEPENDENT_AMBULATORY_CARE_PROVIDER_SITE_OTHER): Payer: Self-pay | Admitting: Family Medicine

## 2019-01-17 ENCOUNTER — Other Ambulatory Visit: Payer: Self-pay

## 2019-01-17 ENCOUNTER — Ambulatory Visit (INDEPENDENT_AMBULATORY_CARE_PROVIDER_SITE_OTHER): Payer: Medicare Other | Admitting: Family Medicine

## 2019-01-17 DIAGNOSIS — E669 Obesity, unspecified: Secondary | ICD-10-CM | POA: Diagnosis not present

## 2019-01-17 DIAGNOSIS — E119 Type 2 diabetes mellitus without complications: Secondary | ICD-10-CM

## 2019-01-17 DIAGNOSIS — Z6834 Body mass index (BMI) 34.0-34.9, adult: Secondary | ICD-10-CM

## 2019-01-17 NOTE — Progress Notes (Signed)
Office: [phone]  /  Fax: [phone] TeleHealth Visit:  Tanner Young has verbally consented to this TeleHealth visit today. The patient is located at home, the provider is located at the [facility] office. The participants in this visit include the listed provider and patient and Tanner Young his spouse. FT failed so the visit was conducted today via Telephone.  HPI:   Chief Complaint: OBESITY Tanner Young is here to discuss his progress with his obesity treatment plan. He is on the  follow the Category 3 plan and is following his eating plan approximately 60 % of the time. He states he is exercising by walking for 35 minutes 4 times per week. Tanner Young continues to do well maintaining weight but is deviating from his plan more and would like to look at other options. His hunger is controlled.  We were unable to weigh the patient today for this TeleHealth visit. He feels as if he has maintained weight since his last visit. He has lost 30 lbs since starting treatment with [name].  Diabetes II Tanner Young has a diagnosis of diabetes type II. Tanner Young is doing well on medicationsand denies any hypoglycemic episodes, nausea, and vomiting. . Last A1c was 6.2. He has been working on intensive lifestyle modifications including diet, exercise, and weight loss to help control his blood glucose levels.  ASSESSMENT AND PLAN:  Type 2 diabetes mellitus without complication, without long-term current use of insulin (HCC)  Class 1 obesity with serious comorbidity and body mass index (BMI) of 34.0 to 34.9 in adult, unspecified obesity type  PLAN: Diabetes II Tanner Young has been given extensive diabetes education by myself today including ideal fasting and post-prandial blood glucose readings, individual ideal HgA1c goals  and hypoglycemia prevention. We discussed the importance of good blood sugar control to decrease the likelihood of diabetic complications such as nephropathy, neuropathy, limb loss,  blindness, coronary artery disease, and death. We discussed the importance of intensive lifestyle modification including diet, exercise and weight loss as the first line treatment for diabetes. Tanner Young agrees to continue his diabetes medications and will follow up at the agreed upon time. We will repeat labs when safe to come back into the office.   I spent > than 50% of the 15 minute visit on counseling as documented in the note.  Obesity Tanner Young is currently in the action stage of change. As such, his goal is to continue with weight loss efforts He has agreed to change and follow a lower carbohydrate, vegetable and lean protein rich diet plan Tanner Young has been instructed to work up to a goal of 150 minutes of combined cardio and strengthening exercise per week for weight loss and overall health benefits. We discussed the following Behavioral Modification Stratagies today:increasing water,  increasing lean protein intake and increasing vegetables   Tanner Young has agreed to follow up with our clinic in 2 weeks. He was informed of the importance of frequent follow up visits to maximize his success with intensive lifestyle modifications for his multiple health conditions.  ALLERGIES: Allergies  Allergen Reactions  . Shellfish Allergy Itching and Nausea And Vomiting    Shrimp     MEDICATIONS: Current Outpatient Medications on File Prior to Visit  Medication Sig Dispense Refill  . amLODipine (NORVASC) 10 MG tablet Take 10 mg by mouth daily.     . benazepril (LOTENSIN) 40 MG tablet Take 40 mg by mouth daily.    . blood glucose meter kit and supplies KIT Dispense based on patient and  insurance preference. Use daily as directed to monitor blood sugar. (FOR ICD-9 250.00, 250.01). 1 each 0  . brimonidine (ALPHAGAN) 0.15 % ophthalmic solution Place 1 drop into both eyes 2 (two) times daily at 10 AM and 5 PM.    . cetirizine (ZYRTEC) 10 MG tablet Take 10 mg by mouth daily.    . cholecalciferol (VITAMIN D)  1000 UNITS tablet Take 1,000 Units by mouth daily.    . cyclobenzaprine (FLEXERIL) 10 MG tablet Take 0.5-1 tablets (5-10 mg total) by mouth 3 (three) times daily as needed for muscle spasms. 30 tablet 0  . desonide (DESOWEN) 0.05 % cream Apply topically 2 (two) times daily.    . dorzolamide-timolol (COSOPT) 22.3-6.8 MG/ML ophthalmic solution Place 1 drop into both eyes 2 (two) times daily.     [name] gabapentin (NEURONTIN) 300 MG capsule Take 300 mg by mouth at bedtime.    [name] ketoconazole (NIZORAL) 2 % cream Apply 1 application topically daily.    . metFORMIN (GLUCOPHAGE-XR) 500 MG 24 hr tablet Take 1 tablet (500 mg total) by mouth daily with breakfast. 90 tablet 1  . moxifloxacin (VIGAMOX) 0.5 % ophthalmic solution 1 drop 3 (three) times daily.    [name] omeprazole (PRILOSEC) 20 MG capsule Take 20 mg by mouth at bedtime.    [name] PRESCRIPTION MEDICATION Apply 1 application topically daily. Cream to feet    . PRESCRIPTION MEDICATION Apply 1 application topically daily. Cream to back    . RHOPRESSA 0.02 % SOLN INT 1 GTT IN OU Q NIGHT  3  . sildenafil (VIAGRA) 100 MG tablet Take 100 mg by mouth daily as needed for erectile dysfunction.    . simvastatin (ZOCOR) 20 MG tablet Take 10 mg by mouth at bedtime.    . terbinafine (LAMISIL) 1 % cream Apply 1 application topically 2 (two) times daily.    [name] triamcinolone ointment (KENALOG) 0.1 % Apply 1 application topically 2 (two) times daily.    [name] triamterene-hydrochlorothiazide (MAXZIDE) 75-50 MG per tablet Take 0.5 tablets by mouth daily.    . Vitamin D, Ergocalciferol, 50 MCG (2000 UT) CAPS Take 1 tablet by mouth daily. 30 capsule 0   No current facility-administered medications on file prior to visit.     PAST MEDICAL HISTORY: Past Medical History:  Diagnosis Date  . Arthritis   . Chronic back pain   . Chronic kidney disease   . Cough   . Diabetes mellitus type II, controlled ([place])   . Dry skin   . Floaters in visual field   . GERD (gastroesophageal reflux  disease)   . Glaucoma, both eyes   . Hay fever   . Heartburn   . Hyperlipemia   . Hypertension   . Itching   . Joint pain   . Kidney disease   . Lactose intolerance   . Leg cramp   . Low back pain   . Multiple food allergies   . Muscle pain   . Neuropathy    rt leg from DDD  . Rash   . Sleep apnea    uses a cpap  . Stiff neck   . Swelling of lower extremity   . Trouble in sleeping   . Vision changes   . Vitamin D deficiency   . Wears glasses   . Wears partial dentures    top mid partial-flipper    PAST SURGICAL HISTORY: Past Surgical History:  Procedure Laterality Date  . COLONOSCOPY    . DENTAL SURGERY  extractions  . EYE SURGERY    . ORIF PATELLA Right 01/24/2013   Procedure: OPEN REDUCTION INTERNAL (ORIF) FIXATION PATELLA;  Surgeon: Kerin Salen, MD;  Location: [facility];  Service: Orthopedics;  Laterality: Right;  orif right patella,patellectomy     SOCIAL HISTORY: Social History   Tobacco Use  . Smoking status: Never Smoker  . Smokeless tobacco: Never Used  Substance Use Topics  . Alcohol use: No  . Drug use: No    FAMILY HISTORY: Family History  Problem Relation Age of Onset  . Arthritis Mother   . Hearing loss Mother   . Hyperlipidemia Mother   . Hypertension Mother   . Diabetes Mother   . Cancer Father   . Hypertension Father   . Diabetes Brother   . Hyperlipidemia Brother   . Hypertension Brother     ROS: Review of Systems  Gastrointestinal: Negative for nausea and vomiting.  Endo/Heme/Allergies:       Negative for hypoglycemia     PHYSICAL EXAM: Pt in no acute distress  RECENT LABS AND TESTS: BMET    Component Value Date/Time   NA 137 09/13/2018 0845   K 3.7 09/13/2018 0845   CL 102 09/13/2018 0845   CO2 27 09/13/2018 0845   GLUCOSE 99 09/13/2018 0845   BUN 24 (H) 09/13/2018 0845   CREATININE 1.17 09/13/2018 0845   CALCIUM 10.0 09/13/2018 0845   Lab Results  Component Value Date   HGBA1C 6.2 (H)  05/08/2018   HGBA1C 7.6 (H) 01/27/2018   Lab Results  Component Value Date   INSULIN 19.3 05/08/2018   CBC    Component Value Date/Time   WBC 5.2 01/20/2018 1057   RBC 4.28 01/20/2018 1057   HGB 13.0 01/20/2018 1057   HCT 37.9 (L) 01/20/2018 1057   PLT 188.0 01/20/2018 1057   MCV 88.7 01/20/2018 1057   MCHC 34.4 01/20/2018 1057   RDW 13.6 01/20/2018 1057   Iron/TIBC/Ferritin/ %Sat No results found for: IRON, TIBC, FERRITIN, IRONPCTSAT Lipid Panel     Component Value Date/Time   CHOL 162 05/08/2018 1043   TRIG 123 05/08/2018 1043   HDL 42 05/08/2018 1043   LDLCALC 95 05/08/2018 1043   Hepatic Function Panel     Component Value Date/Time   PROT 6.7 01/20/2018 1057   ALBUMIN 4.0 01/20/2018 1057   AST 21 01/20/2018 1057   ALT 24 01/20/2018 1057   ALKPHOS 61 01/20/2018 1057   BILITOT 0.7 01/20/2018 1057      Component Value Date/Time   TSH 2.28 09/13/2018 0845   TSH 2.670 05/08/2018 1043   TSH 1.75 01/20/2018 1057      I, Renee Ramus, am acting as Location manager for Dennard Nip, MD  I have reviewed the above documentation for accuracy and completeness, and I agree with the above. -Dennard Nip, MD

## 2019-01-31 ENCOUNTER — Telehealth: Payer: Self-pay | Admitting: Family Medicine

## 2019-01-31 ENCOUNTER — Other Ambulatory Visit: Payer: Self-pay

## 2019-01-31 ENCOUNTER — Encounter (INDEPENDENT_AMBULATORY_CARE_PROVIDER_SITE_OTHER): Payer: Self-pay | Admitting: Family Medicine

## 2019-01-31 ENCOUNTER — Ambulatory Visit (INDEPENDENT_AMBULATORY_CARE_PROVIDER_SITE_OTHER): Payer: Medicare Other | Admitting: Family Medicine

## 2019-01-31 DIAGNOSIS — E119 Type 2 diabetes mellitus without complications: Secondary | ICD-10-CM

## 2019-01-31 DIAGNOSIS — M25562 Pain in left knee: Secondary | ICD-10-CM

## 2019-01-31 DIAGNOSIS — M25561 Pain in right knee: Secondary | ICD-10-CM

## 2019-01-31 DIAGNOSIS — E669 Obesity, unspecified: Secondary | ICD-10-CM

## 2019-01-31 DIAGNOSIS — Z6834 Body mass index (BMI) 34.0-34.9, adult: Secondary | ICD-10-CM

## 2019-01-31 DIAGNOSIS — E559 Vitamin D deficiency, unspecified: Secondary | ICD-10-CM

## 2019-01-31 MED ORDER — METFORMIN HCL ER 500 MG PO TB24: 500.0000 mg | ORAL_TABLET | Freq: Every day | ORAL | 0 refills | Status: DC

## 2019-01-31 MED ORDER — VITAMIN D (ERGOCALCIFEROL) 50 MCG (2000 UT) PO CAPS: 1.0000 | ORAL_CAPSULE | Freq: Every day | ORAL | 0 refills | Status: DC

## 2019-01-31 NOTE — Telephone Encounter (Signed)
Pt came by and dropped off DMV disability plate form. Please call patient once forms are filled out. Forms will be in provider's box at the front.

## 2019-02-01 NOTE — Telephone Encounter (Signed)
Gave to Dr.Matthews. He will complete it on next in-office schedule of Monday.

## 2019-02-05 NOTE — Telephone Encounter (Signed)
Paperwork for [employer] is completed. Copies and Pt has been called/notifed. He will pick up front folders sometime today. Closing task.

## 2019-02-07 DIAGNOSIS — H4052X2 Glaucoma secondary to other eye disorders, left eye, moderate stage: Secondary | ICD-10-CM | POA: Diagnosis not present

## 2019-02-07 DIAGNOSIS — H4051X3 Glaucoma secondary to other eye disorders, right eye, severe stage: Secondary | ICD-10-CM | POA: Diagnosis not present

## 2019-02-10 DIAGNOSIS — Z0279 Encounter for issue of other medical certificate: Secondary | ICD-10-CM

## 2019-02-12 NOTE — Progress Notes (Signed)
Office: [phone]  /  Fax: [phone] TeleHealth Visit:  Tanner Young has verbally consented to this TeleHealth visit today. The patient is located at home, the provider is located at the [facility] office. The participants in this visit include the listed provider and patient and any and all parties involved. The visit was conducted today via FaceTime.  HPI:   Chief Complaint: OBESITY Tanner Young is here to discuss his progress with his obesity treatment plan. He is on the lower carbohydrate, vegetable and lean protein rich diet plan and is following his eating plan approximately 100 % of the time. He states he is walking 30 to 40 minutes 4 to 5 times per week. Tanner Young is doing well on the low carb plan. He thinks he has lost 3 pounds in the last 2 weeks, and he states hunger is controlled. Tanner Young is getting a bit bored with his food options, and he asks for snack ideas to fit his plan. We were unable to weigh the patient today for this TeleHealth visit. He feels as if he has lost weight since his last visit. He has lost 33 lbs since starting treatment with [name].  Bilateral Knee Pain Tanner Young has been working weight loss with diet and walking. He notes more joint pain with the really cold rainy weather, and so he has decreased walking outdoors, but he is planning to start again; otherwise his knees have improved with weight loss.  Diabetes II Tanner Young has a diagnosis of diabetes type II. Tanner Young is stable on medications and he is doing well with his diet. He denies nausea, vomiting or hypoglycemia. He has been working on intensive lifestyle modifications including diet, exercise, and weight loss to help control his blood glucose levels.  ASSESSMENT AND PLAN:  Vitamin D deficiency - Plan: Vitamin D, Ergocalciferol, 63 MCG (2000 UT) CAPS  Type 2 diabetes mellitus without complication, without long-term current use of insulin ([place]) - Plan: metFORMIN (GLUCOPHAGE-XR) 500 MG 24 hr  tablet  Pain in both knees, unspecified chronicity  Class 1 obesity with serious comorbidity and body mass index (BMI) of 34.0 to 34.9 in adult, unspecified obesity type  PLAN:  Bilateral Knee Pain Tanner Young is to continue walking regularly, but he is to watch for signs, he is over doing it. He will continue with diet and weight loss and we will continue to follow.  Diabetes II Tanner Young has been given extensive diabetes education by myself today including ideal fasting and post-prandial blood glucose readings, individual ideal Hgb A1c goals and hypoglycemia prevention. We discussed the importance of good blood sugar control to decrease the likelihood of diabetic complications such as nephropathy, neuropathy, limb loss, blindness, coronary artery disease, and death. We discussed the importance of intensive lifestyle modification including diet, exercise and weight loss as the first line treatment for diabetes. Tanner Young agrees to continue metformin 500 mg daily with breakfast #30 with no refills and follow up at the agreed upon time.  I spent > than 50% of the 25 minute visit on counseling as documented in the note.  Obesity Tanner Young is currently in the action stage of change. As such, his goal is to continue with weight loss efforts He has agreed to follow a lower carbohydrate, vegetable and lean protein rich diet plan Tanner Young has been instructed to work up to a goal of 150 minutes of combined cardio and strengthening exercise per week for weight loss and overall health benefits. We discussed the following Behavioral Modification Strategies today:  better snacking choices and work on meal planning and easy cooking plans  Tanner Young has agreed to follow up with our clinic in 3 weeks. He was informed of the importance of frequent follow up visits to maximize his success with intensive lifestyle modifications for his multiple health conditions.  ALLERGIES: Allergies  Allergen Reactions   Shellfish Allergy  Itching and Nausea And Vomiting    Shrimp     MEDICATIONS: Current Outpatient Medications on File Prior to Visit  Medication Sig Dispense Refill   amLODipine (NORVASC) 10 MG tablet Take 10 mg by mouth daily.      benazepril (LOTENSIN) 40 MG tablet Take 40 mg by mouth daily.     blood glucose meter kit and supplies KIT Dispense based on patient and insurance preference. Use daily as directed to monitor blood sugar. (FOR ICD-9 250.00, 250.01). 1 each 0   brimonidine (ALPHAGAN) 0.15 % ophthalmic solution Place 1 drop into both eyes 2 (two) times daily at 10 AM and 5 PM.     cetirizine (ZYRTEC) 10 MG tablet Take 10 mg by mouth daily.     cholecalciferol (VITAMIN D) 1000 UNITS tablet Take 1,000 Units by mouth daily.     cyclobenzaprine (FLEXERIL) 10 MG tablet Take 0.5-1 tablets (5-10 mg total) by mouth 3 (three) times daily as needed for muscle spasms. 30 tablet 0   desonide (DESOWEN) 0.05 % cream Apply topically 2 (two) times daily.     dorzolamide-timolol (COSOPT) 22.3-6.8 MG/ML ophthalmic solution Place 1 drop into both eyes 2 (two) times daily.      gabapentin (NEURONTIN) 300 MG capsule Take 300 mg by mouth at bedtime.     ketoconazole (NIZORAL) 2 % cream Apply 1 application topically daily.     moxifloxacin (VIGAMOX) 0.5 % ophthalmic solution 1 drop 3 (three) times daily.     omeprazole (PRILOSEC) 20 MG capsule Take 20 mg by mouth at bedtime.     PRESCRIPTION MEDICATION Apply 1 application topically daily. Cream to feet     PRESCRIPTION MEDICATION Apply 1 application topically daily. Cream to back     RHOPRESSA 0.02 % SOLN INT 1 GTT IN OU Q NIGHT  3   sildenafil (VIAGRA) 100 MG tablet Take 100 mg by mouth daily as needed for erectile dysfunction.     simvastatin (ZOCOR) 20 MG tablet Take 10 mg by mouth at bedtime.     terbinafine (LAMISIL) 1 % cream Apply 1 application topically 2 (two) times daily.     triamcinolone ointment (KENALOG) 0.1 % Apply 1 application  topically 2 (two) times daily.     triamterene-hydrochlorothiazide (MAXZIDE) 75-50 MG per tablet Take 0.5 tablets by mouth daily.     No current facility-administered medications on file prior to visit.     PAST MEDICAL HISTORY: Past Medical History:  Diagnosis Date   Arthritis    Chronic back pain    Chronic kidney disease    Cough    Diabetes mellitus type II, controlled ([place])    Dry skin    Floaters in visual field    GERD (gastroesophageal reflux disease)    Glaucoma, both eyes    Hay fever    Heartburn    Hyperlipemia    Hypertension    Itching    Joint pain    Kidney disease    Lactose intolerance    Leg cramp    Low back pain    Multiple food allergies    Muscle pain    Neuropathy  rt leg from DDD   Rash    Sleep apnea    uses a cpap   Stiff neck    Swelling of lower extremity    Trouble in sleeping    Vision changes    Vitamin D deficiency    Wears glasses    Wears partial dentures    top mid partial-flipper    PAST SURGICAL HISTORY: Past Surgical History:  Procedure Laterality Date   COLONOSCOPY     DENTAL SURGERY     extractions   EYE SURGERY     ORIF PATELLA Right 01/24/2013   Procedure: OPEN REDUCTION INTERNAL (ORIF) FIXATION PATELLA;  Surgeon: Kerin Salen, MD;  Location: [facility];  Service: Orthopedics;  Laterality: Right;  orif right patella,patellectomy     SOCIAL HISTORY: Social History   Tobacco Use   Smoking status: Never Smoker   Smokeless tobacco: Never Used  Substance Use Topics   Alcohol use: No   Drug use: No    FAMILY HISTORY: Family History  Problem Relation Age of Onset   Arthritis Mother    Hearing loss Mother    Hyperlipidemia Mother    Hypertension Mother    Diabetes Mother    Cancer Father    Hypertension Father    Diabetes Brother    Hyperlipidemia Brother    Hypertension Brother     ROS: Review of Systems  Constitutional: Positive  for weight loss.  Gastrointestinal: Negative for nausea and vomiting.  Musculoskeletal:       + Knee Pain bilaterally  Endo/Heme/Allergies:       Negative for hypoglycemia    PHYSICAL EXAM: Pt in no acute distress  RECENT LABS AND TESTS: BMET    Component Value Date/Time   NA 137 09/13/2018 0845   K 3.7 09/13/2018 0845   CL 102 09/13/2018 0845   CO2 27 09/13/2018 0845   GLUCOSE 99 09/13/2018 0845   BUN 24 (H) 09/13/2018 0845   CREATININE 1.17 09/13/2018 0845   CALCIUM 10.0 09/13/2018 0845   Lab Results  Component Value Date   HGBA1C 6.2 (H) 05/08/2018   HGBA1C 7.6 (H) 01/27/2018   Lab Results  Component Value Date   INSULIN 19.3 05/08/2018   CBC    Component Value Date/Time   WBC 5.2 01/20/2018 1057   RBC 4.28 01/20/2018 1057   HGB 13.0 01/20/2018 1057   HCT 37.9 (L) 01/20/2018 1057   PLT 188.0 01/20/2018 1057   MCV 88.7 01/20/2018 1057   MCHC 34.4 01/20/2018 1057   RDW 13.6 01/20/2018 1057   Iron/TIBC/Ferritin/ %Sat No results found for: IRON, TIBC, FERRITIN, IRONPCTSAT Lipid Panel     Component Value Date/Time   CHOL 162 05/08/2018 1043   TRIG 123 05/08/2018 1043   HDL 42 05/08/2018 1043   LDLCALC 95 05/08/2018 1043   Hepatic Function Panel     Component Value Date/Time   PROT 6.7 01/20/2018 1057   ALBUMIN 4.0 01/20/2018 1057   AST 21 01/20/2018 1057   ALT 24 01/20/2018 1057   ALKPHOS 61 01/20/2018 1057   BILITOT 0.7 01/20/2018 1057      Component Value Date/Time   TSH 2.28 09/13/2018 0845   TSH 2.670 05/08/2018 1043   TSH 1.75 01/20/2018 1057     Ref. Range 05/08/2018 10:43  Vitamin D, 25-Hydroxy Latest Ref Range: 30.0 - 100.0 ng/mL 45.5   I, Doreene Nest, am acting as Location manager for Dennard Nip, MD

## 2019-02-20 ENCOUNTER — Encounter: Payer: Self-pay | Admitting: Family Medicine

## 2019-02-21 ENCOUNTER — Encounter: Payer: Self-pay | Admitting: Family Medicine

## 2019-02-21 ENCOUNTER — Telehealth: Payer: Self-pay

## 2019-02-21 ENCOUNTER — Telehealth (INDEPENDENT_AMBULATORY_CARE_PROVIDER_SITE_OTHER): Payer: Medicare Other | Admitting: Family Medicine

## 2019-02-21 ENCOUNTER — Ambulatory Visit (INDEPENDENT_AMBULATORY_CARE_PROVIDER_SITE_OTHER): Payer: Medicare Other | Admitting: Family Medicine

## 2019-02-21 VITALS — BP 120/80 | HR 51 | Temp 98.3°F | Resp 18 | Ht 70.0 in | Wt 226.0 lb

## 2019-02-21 DIAGNOSIS — R001 Bradycardia, unspecified: Secondary | ICD-10-CM | POA: Diagnosis not present

## 2019-02-21 MED ORDER — AMLODIPINE BESYLATE 2.5 MG PO TABS: 2.5000 mg | ORAL_TABLET | Freq: Every day | ORAL | 0 refills | Status: DC

## 2019-02-21 NOTE — Patient Instructions (Signed)

## 2019-02-21 NOTE — Progress Notes (Signed)
Tanner Young - 63 y.o. male MRN [id number]  Date of birth: [birth year]  Subjective Chief Complaint  Patient presents with  . Bradycardia    lost 100+ lbs/ discuss med changes.     HPI Tanner Young is a 63 y.o. male with history of T2DM, CKD and HTN here today due to concern of low heart rate.  He states that he has had 7-8 warnings from the EKG on his smart watch that his heart rate was below 40.  His typical resting heart rate is around 40-50.  The readings <40 have only occurred while sleeping.  He denies symptoms of dizziness, lightheadedness, chest pain, shortness of breath, palpitations.  He does fell tired on occasion.  He was seen for this in 08/2018 as well with EKG showing sinus bradycardia with rate of 49, electrolytes and thyroid function.  He has been working with [facility] and has lost ~100 lbs over the past year.   ROS:  A comprehensive ROS was completed and negative except as noted per HPI   Allergies  Allergen Reactions  . Shellfish Allergy Itching and Nausea And Vomiting    Shrimp     Past Medical History:  Diagnosis Date  . Arthritis   . Chronic back pain   . Chronic kidney disease   . Cough   . Diabetes mellitus type II, controlled ([place])   . Dry skin   . Floaters in visual field   . GERD (gastroesophageal reflux disease)   . Glaucoma, both eyes   . Hay fever   . Heartburn   . Hyperlipemia   . Hypertension   . Itching   . Joint pain   . Kidney disease   . Lactose intolerance   . Leg cramp   . Low back pain   . Multiple food allergies   . Muscle pain   . Neuropathy    rt leg from DDD  . Rash   . Sleep apnea    uses a cpap  . Stiff neck   . Swelling of lower extremity   . Trouble in sleeping   . Vision changes   . Vitamin D deficiency   . Wears glasses   . Wears partial dentures    top mid partial-flipper    Past Surgical History:  Procedure Laterality Date  . COLONOSCOPY    . DENTAL SURGERY     extractions  . EYE SURGERY    . ORIF PATELLA Right 01/24/2013   Procedure: OPEN REDUCTION INTERNAL (ORIF) FIXATION PATELLA;  Surgeon: Kerin Salen, MD;  Location: [place];  Service: Orthopedics;  Laterality: Right;  orif right patella,patellectomy     Social History   Socioeconomic History  . Marital status: Married    Spouse name: Runner, broadcasting/film/video  . Number of children: Not on file  . Years of education: Not on file  . Highest education level: Not on file  Occupational History  . Occupation: Retired  Scientific laboratory technician  . Financial resource strain: Not on file  . Food insecurity    Worry: Not on file    Inability: Not on file  . Transportation needs    Medical: Not on file    Non-medical: Not on file  Tobacco Use  . Smoking status: Never Smoker  . Smokeless tobacco: Never Used  Substance and Sexual Activity  . Alcohol use: No  . Drug use: No  . Sexual activity: Not on file  Lifestyle  .  Physical activity    Days per week: Not on file    Minutes per session: Not on file  . Stress: Not on file  Relationships  . Social Herbalist on phone: Not on file    Gets together: Not on file    Attends religious service: Not on file    Active member of club or organization: Not on file    Attends meetings of clubs or organizations: Not on file    Relationship status: Not on file  Other Topics Concern  . Not on file  Social History Narrative  . Not on file    Family History  Problem Relation Age of Onset  . Arthritis Mother   . Hearing loss Mother   . Hyperlipidemia Mother   . Hypertension Mother   . Diabetes Mother   . Cancer Father   . Hypertension Father   . Diabetes Brother   . Hyperlipidemia Brother   . Hypertension Brother     Health Maintenance  Topic Date Due  . Hepatitis C Screening  08/26/1953  . FOOT EXAM  08/06/1964  . HIV Screening  08/06/1969  . TETANUS/TDAP  08/06/1973  . OPHTHALMOLOGY EXAM  02/16/2019  . HEMOGLOBIN A1C  03/16/2019   . INFLUENZA VACCINE  03/17/2019  . COLONOSCOPY  09/21/2028    ----------------------------------------------------------------------------------------------------------------------------------------------------------------------------------------------------------------- Physical Exam BP 120/80   Pulse (!) 51   Temp 98.3 F (36.8 C) (Oral)   Resp 18   Ht 5\' 10"  (1.778 m)   Wt 226 lb (102.5 kg)   SpO2 97%   BMI 32.43 kg/m   Physical Exam Constitutional:      Appearance: Normal appearance.  HENT:     Head: Normocephalic and atraumatic.     Mouth/Throat:     Mouth: Mucous membranes are moist.  Eyes:     General: No scleral icterus. Neck:     Musculoskeletal: Neck supple.  Cardiovascular:     Rate and Rhythm: Bradycardia present.     Heart sounds: Normal heart sounds.  Pulmonary:     Effort: Pulmonary effort is normal.     Breath sounds: Normal breath sounds.  Skin:    General: Skin is warm and dry.  Neurological:     General: No focal deficit present.     Mental Status: He is alert.  Psychiatric:        Mood and Affect: Mood normal.        Behavior: Behavior normal.     ------------------------------------------------------------------------------------------------------------------------------------------------------------------------------------------------------------------- Assessment and Plan  Bradycardia -No symptoms other than occasional fatigue feeling.  -Medications reviewed and I don't see any medications that would affect HR except potentially amlodipine.  Will see if reduction is dosage makes a difference.   -He and his wife would like reassurance of cardiologist as well, referral placed.

## 2019-02-21 NOTE — Telephone Encounter (Signed)
Questions for Screening COVID-19  Symptom onset: none  Travel or Contacts: none  During this illness, did/does the patient experience any of the following symptoms? Fever >100.109F []   Yes [x]   No []   Unknown Subjective fever (felt feverish) []   Yes [x]   No []   Unknown Chills []   Yes [x]   No []   Unknown Muscle aches (myalgia) []   Yes [x]   No []   Unknown Runny nose (rhinorrhea) []   Yes [x]   No []   Unknown Sore throat []   Yes [x]   No []   Unknown Cough (new onset or worsening of chronic cough) []   Yes [x]   No []   Unknown Shortness of breath (dyspnea) []   Yes [x]   No []   Unknown Nausea or vomiting []   Yes [x]   No []   Unknown Headache []   Yes [x]   No []   Unknown Abdominal pain  []   Yes [x]   No []   Unknown Diarrhea (?3 loose/looser than normal stools/24hr period) []   Yes [x]   No []   Unknown Other, specify:  Patient risk factors: Smoker? []   Current []   Former [x]   Never If male, currently pregnant? []   Yes [x]   No  Patient Active Problem List   Diagnosis Date Noted  . HLD (hyperlipidemia) 12/19/2018  . GERD (gastroesophageal reflux disease) 12/19/2018  . Bradycardia 09/13/2018  . Type 2 diabetes mellitus without complication, without long-term current use of insulin ([place]) 01/30/2018  . Essential hypertension 01/20/2018  . Chronic kidney disease 01/20/2018  . Urinary hesitancy 01/20/2018  . Osteoarthritis 01/20/2018  . OSA on CPAP 01/20/2018  . Open-angle glaucoma of both eyes, indeterminate stage 05/23/2017    Plan:  []   High risk for COVID-19 with red flags go to ED (with CP, SOB, weak/lightheaded, or fever > 101.5). Call ahead.  []   High risk for COVID-19 but stable. Inform provider and coordinate time for [facility] visit.   [x]   No red flags but URI signs or symptoms okay for [facility] visit.

## 2019-02-21 NOTE — Assessment & Plan Note (Signed)
-  No symptoms other than occasional fatigue feeling.  -Medications reviewed and I don't see any medications that would affect HR except potentially amlodipine.  Will see if reduction is dosage makes a difference.   -He and his wife would like reassurance of cardiologist as well, referral placed.

## 2019-02-22 ENCOUNTER — Encounter (INDEPENDENT_AMBULATORY_CARE_PROVIDER_SITE_OTHER): Payer: Self-pay | Admitting: Family Medicine

## 2019-02-22 ENCOUNTER — Telehealth (INDEPENDENT_AMBULATORY_CARE_PROVIDER_SITE_OTHER): Payer: Medicare Other | Admitting: Family Medicine

## 2019-02-22 ENCOUNTER — Other Ambulatory Visit: Payer: Self-pay

## 2019-02-22 DIAGNOSIS — E669 Obesity, unspecified: Secondary | ICD-10-CM

## 2019-02-22 DIAGNOSIS — Z6834 Body mass index (BMI) 34.0-34.9, adult: Secondary | ICD-10-CM | POA: Diagnosis not present

## 2019-02-22 DIAGNOSIS — R001 Bradycardia, unspecified: Secondary | ICD-10-CM | POA: Diagnosis not present

## 2019-02-22 DIAGNOSIS — E119 Type 2 diabetes mellitus without complications: Secondary | ICD-10-CM | POA: Diagnosis not present

## 2019-02-27 NOTE — Progress Notes (Signed)
Office: [phone]  /  Fax: [phone] TeleHealth Visit:  Tanner Young has verbally consented to this TeleHealth visit today. The patient is located at home, the provider is located at the [facility] office. The participants in this visit include the listed provider and patient and patient's partner. The visit was conducted today via face time.  HPI:   Chief Complaint: OBESITY Tanner Young is here to discuss his progress with his obesity treatment plan. He is on the lower carbohydrate, vegetable and lean protein rich diet plan and is following his eating plan approximately 80-85 % of the time. He states he is walking for 25-30 minutes 3-4 times per week. Tanner Young continues to do well on his diet prescription. He does indulge in sweets and fruit which are not on his plan, but do not seem to be a problem. His weight at home today was 215 lbs. He states his CBGs fasting was 101 this morning. We were unable to weigh the patient today for this TeleHealth visit. He feels as if he has lost weight since his last visit. He has lost 30 lbs since starting treatment with [name].  Sinus Bradycardia Tanner Young was seen by Dr. Einar Pheasant yesterday due to his heart rate dropping low to the 40's. He is not on a B-blocker and his thyroid is within normal limits. He is asymptomatic. He was advised to decrease his amlodipine to 1/2 tablet PO q daily and he started that this morning. He has a referral to Cardiology as well. He states his blood pressure was 113/64, pulse of 49, and O2 sat at 97 %.  ASSESSMENT AND PLAN:  Sinus bradycardia by electrocardiogram  Type 2 diabetes mellitus without complication, without long-term current use of insulin (HCC)  Class 1 obesity with serious comorbidity and body mass index (BMI) of 34.0 to 34.9 in adult, unspecified obesity type  PLAN:  Sinus Bradycardia Tanner Young is to continue with a healthy diet and exercise. He agreed to keep his Cardiology appointment and we will  continue to monitor. Antionne agrees to follow up with our clinic in 2 to 3 weeks.  I spent > than 50% of the 25 minute visit on counseling as documented in the note.  Obesity Tanner Young is currently in the action stage of change. As such, his goal is to continue with weight loss efforts He has agreed to follow a lower carbohydrate, vegetable and lean protein rich diet plan Tanner Young has been instructed to work up to a goal of 150 minutes of combined cardio and strengthening exercise per week for weight loss and overall health benefits. We discussed the following Behavioral Modification Strategies today: increasing vegetables, increase H20 intake, and no skipping meals   Kervin has agreed to follow up with our clinic in 2 to 3 weeks. He was informed of the importance of frequent follow up visits to maximize his success with intensive lifestyle modifications for his multiple health conditions.  ALLERGIES: Allergies  Allergen Reactions  . Shellfish Allergy Itching and Nausea And Vomiting    Shrimp     MEDICATIONS: Current Outpatient Medications on File Prior to Visit  Medication Sig Dispense Refill  . amLODipine (NORVASC) 2.5 MG tablet Take 1 tablet (2.5 mg total) by mouth daily. 90 tablet 0  . Aspirin Buf,CaCarb-MgCarb-MgO, 81 MG TABS Take by mouth.    . benazepril (LOTENSIN) 40 MG tablet Take 40 mg by mouth daily.    . blood glucose meter kit and supplies KIT Dispense based on patient and  insurance preference. Use daily as directed to monitor blood sugar. (FOR ICD-9 250.00, 250.01). 1 each 0  . brimonidine (ALPHAGAN) 0.15 % ophthalmic solution Place 1 drop into both eyes 2 (two) times daily at 10 AM and 5 PM.    . cetirizine (ZYRTEC) 10 MG tablet Take 10 mg by mouth daily.    . cholecalciferol (VITAMIN D) 1000 UNITS tablet Take 1,000 Units by mouth daily.    . cyclobenzaprine (FLEXERIL) 10 MG tablet Take 0.5-1 tablets (5-10 mg total) by mouth 3 (three) times daily as needed for muscle spasms.  30 tablet 0  . desonide (DESOWEN) 0.05 % cream Apply topically 2 (two) times daily.    . dorzolamide-timolol (COSOPT) 22.3-6.8 MG/ML ophthalmic solution Place 1 drop into both eyes 2 (two) times daily.     [name] gabapentin (NEURONTIN) 300 MG capsule Take 300 mg by mouth at bedtime.    [name] ketoconazole (NIZORAL) 2 % cream Apply 1 application topically daily.    . metFORMIN (GLUCOPHAGE-XR) 500 MG 24 hr tablet Take 1 tablet (500 mg total) by mouth daily with breakfast. 30 tablet 0  . moxifloxacin (VIGAMOX) 0.5 % ophthalmic solution 1 drop 3 (three) times daily.    [name] omeprazole (PRILOSEC) 20 MG capsule Take 20 mg by mouth at bedtime.    [name] PRESCRIPTION MEDICATION Apply 1 application topically daily. Cream to feet    . PRESCRIPTION MEDICATION Apply 1 application topically daily. Cream to back    . RHOPRESSA 0.02 % SOLN INT 1 GTT IN OU Q NIGHT  3  . sildenafil (VIAGRA) 100 MG tablet Take 100 mg by mouth daily as needed for erectile dysfunction.    . simvastatin (ZOCOR) 20 MG tablet Take 10 mg by mouth at bedtime.    . terbinafine (LAMISIL) 1 % cream Apply 1 application topically 2 (two) times daily.    [name] triamcinolone ointment (KENALOG) 0.1 % Apply 1 application topically 2 (two) times daily.    [name] triamterene-hydrochlorothiazide (MAXZIDE) 75-50 MG per tablet Take 0.5 tablets by mouth daily.     No current facility-administered medications on file prior to visit.     PAST MEDICAL HISTORY: Past Medical History:  Diagnosis Date  . Arthritis   . Chronic back pain   . Chronic kidney disease   . Cough   . Diabetes mellitus type II, controlled ([place])   . Dry skin   . Floaters in visual field   . GERD (gastroesophageal reflux disease)   . Glaucoma, both eyes   . Hay fever   . Heartburn   . Hyperlipemia   . Hypertension   . Itching   . Joint pain   . Kidney disease   . Lactose intolerance   . Leg cramp   . Low back pain   . Multiple food allergies   . Muscle pain   . Neuropathy    rt leg from  DDD  . Rash   . Sleep apnea    uses a cpap  . Stiff neck   . Swelling of lower extremity   . Trouble in sleeping   . Vision changes   . Vitamin D deficiency   . Wears glasses   . Wears partial dentures    top mid partial-flipper    PAST SURGICAL HISTORY: Past Surgical History:  Procedure Laterality Date  . COLONOSCOPY    . DENTAL SURGERY     extractions  . EYE SURGERY    . ORIF PATELLA Right 01/24/2013   Procedure:  OPEN REDUCTION INTERNAL (ORIF) FIXATION PATELLA;  Surgeon: Kerin Salen, MD;  Location: [place];  Service: Orthopedics;  Laterality: Right;  orif right patella,patellectomy     SOCIAL HISTORY: Social History   Tobacco Use  . Smoking status: Never Smoker  . Smokeless tobacco: Never Used  Substance Use Topics  . Alcohol use: No  . Drug use: No    FAMILY HISTORY: Family History  Problem Relation Age of Onset  . Arthritis Mother   . Hearing loss Mother   . Hyperlipidemia Mother   . Hypertension Mother   . Diabetes Mother   . Cancer Father   . Hypertension Father   . Diabetes Brother   . Hyperlipidemia Brother   . Hypertension Brother     ROS: Review of Systems  Constitutional: Positive for weight loss.  Cardiovascular:       + Bradycardia    PHYSICAL EXAM: Pt in no acute distress  RECENT LABS AND TESTS: BMET    Component Value Date/Time   NA 137 09/13/2018 0845   K 3.7 09/13/2018 0845   CL 102 09/13/2018 0845   CO2 27 09/13/2018 0845   GLUCOSE 99 09/13/2018 0845   BUN 24 (H) 09/13/2018 0845   CREATININE 1.17 09/13/2018 0845   CALCIUM 10.0 09/13/2018 0845   Lab Results  Component Value Date   HGBA1C 6.2 (H) 05/08/2018   HGBA1C 7.6 (H) 01/27/2018   Lab Results  Component Value Date   INSULIN 19.3 05/08/2018   CBC    Component Value Date/Time   WBC 5.2 01/20/2018 1057   RBC 4.28 01/20/2018 1057   HGB 13.0 01/20/2018 1057   HCT 37.9 (L) 01/20/2018 1057   PLT 188.0 01/20/2018 1057   MCV 88.7 01/20/2018 1057    MCHC 34.4 01/20/2018 1057   RDW 13.6 01/20/2018 1057   Iron/TIBC/Ferritin/ %Sat No results found for: IRON, TIBC, FERRITIN, IRONPCTSAT Lipid Panel     Component Value Date/Time   CHOL 162 05/08/2018 1043   TRIG 123 05/08/2018 1043   HDL 42 05/08/2018 1043   LDLCALC 95 05/08/2018 1043   Hepatic Function Panel     Component Value Date/Time   PROT 6.7 01/20/2018 1057   ALBUMIN 4.0 01/20/2018 1057   AST 21 01/20/2018 1057   ALT 24 01/20/2018 1057   ALKPHOS 61 01/20/2018 1057   BILITOT 0.7 01/20/2018 1057      Component Value Date/Time   TSH 2.28 09/13/2018 0845   TSH 2.670 05/08/2018 1043   TSH 1.75 01/20/2018 1057      I, Trixie Dredge, am acting as Location manager for Dennard Nip, MD I have reviewed the above documentation for accuracy and completeness, and I agree with the above. -Dennard Nip, MD

## 2019-03-08 ENCOUNTER — Encounter (INDEPENDENT_AMBULATORY_CARE_PROVIDER_SITE_OTHER): Payer: Self-pay | Admitting: Family Medicine

## 2019-03-08 ENCOUNTER — Other Ambulatory Visit: Payer: Self-pay

## 2019-03-08 ENCOUNTER — Telehealth (INDEPENDENT_AMBULATORY_CARE_PROVIDER_SITE_OTHER): Payer: Medicare Other | Admitting: Family Medicine

## 2019-03-08 DIAGNOSIS — E669 Obesity, unspecified: Secondary | ICD-10-CM | POA: Diagnosis not present

## 2019-03-08 DIAGNOSIS — I10 Essential (primary) hypertension: Secondary | ICD-10-CM | POA: Diagnosis not present

## 2019-03-08 DIAGNOSIS — Z6834 Body mass index (BMI) 34.0-34.9, adult: Secondary | ICD-10-CM

## 2019-03-12 NOTE — Progress Notes (Signed)
Office: [phone]  /  Fax: [phone] TeleHealth Visit:  [name] has verbally consented to this TeleHealth visit today. The patient is located at home, the provider is located at the [facility] office. The participants in this visit include the listed provider and patient and any and all parties involved. The visit was conducted today via FaceTime.  HPI:   Chief Complaint: OBESITY Yandriel is here to discuss his progress with his obesity treatment plan. He is on the lower carbohydrate, vegetable and lean protein rich diet plan and is following his eating plan approximately 50 % of the time. He states he is exercising 0 minutes 0 times per week. Denym has done very well with his weight loss, with our clinic. He is happy with maintaining his current weight and he feels good overall. Hunger is controlled and he is mostly controlling his portions and is making smarter food choices. We were unable to weigh the patient today for this TeleHealth visit. He feels as if he has maintained weight since his last visit. He has lost 30 lbs since starting treatment with [name].  Hypertension JYAIRE KOUDELKA is a 63 y.o. male with hypertension. His blood pressure at home was 119/65 this week. He restarted amlodipine 5 mg per cardiology, who is also doing a 30 day heart monitor. [name] denies chest pain, headache or dizziness. He denies any syncopal episodes. He is working weight loss to help control his blood pressure with the goal of decreasing his risk of heart attack and stroke. [name] blood pressure is currently controlled.  ASSESSMENT AND PLAN:  Essential hypertension  Class 1 obesity with serious comorbidity and body mass index (BMI) of 34.0 to 34.9 in adult, unspecified obesity type  PLAN:  Hypertension We discussed sodium restriction, working on diet,  healthy weight loss, and a regular exercise program as the means to achieve improved blood pressure control.  Amias agreed with this plan and agreed to follow up as directed. We will continue to monitor his blood pressure as well as his progress with the above lifestyle modifications. He will continue amlodipine and will watch for signs of hypotension as he continues his lifestyle modifications.  Obesity Hakiem is currently in the action stage of change. As such, his goal is to continue with weight loss efforts He has agreed to portion control better and make smarter food choices, such as increase vegetables and decrease simple carbohydrates  Damany has been instructed to work up to a goal of 150 minutes of combined cardio and strengthening exercise per week for weight loss and overall health benefits. We discussed the following Behavioral Modification Strategies today: increase H2O intake, better snacking choices, decreasing simple carbohydrates  and increasing vegetables  Ariz has agreed to follow up with our clinic in 8 weeks. He was informed of the importance of frequent follow up visits to maximize his success with intensive lifestyle modifications for his multiple health conditions.  ALLERGIES: Allergies  Allergen Reactions  . Shellfish Allergy Itching and Nausea And Vomiting    Shrimp     MEDICATIONS: Current Outpatient Medications on File Prior to Visit  Medication Sig Dispense Refill  . amLODipine (NORVASC) 5 MG tablet Take 5 mg by mouth daily.    . Aspirin Buf,CaCarb-MgCarb-MgO, 81 MG TABS Take by mouth.    . benazepril (LOTENSIN) 40 MG tablet Take 40 mg by mouth daily.    . blood glucose meter kit and supplies KIT Dispense based on patient and  insurance preference. Use daily as directed to monitor blood sugar. (FOR ICD-9 250.00, 250.01). 1 each 0  . brimonidine (ALPHAGAN) 0.15 % ophthalmic solution Place 1 drop into both eyes 2 (two) times daily at 10 AM and 5 PM.    . cetirizine (ZYRTEC) 10 MG tablet Take 10 mg by mouth daily.    . cholecalciferol (VITAMIN D) 1000 UNITS tablet Take  1,000 Units by mouth daily.    . cyclobenzaprine (FLEXERIL) 10 MG tablet Take 0.5-1 tablets (5-10 mg total) by mouth 3 (three) times daily as needed for muscle spasms. 30 tablet 0  . desonide (DESOWEN) 0.05 % cream Apply topically 2 (two) times daily.    . dorzolamide-timolol (COSOPT) 22.3-6.8 MG/ML ophthalmic solution Place 1 drop into both eyes 2 (two) times daily.     [name] gabapentin (NEURONTIN) 300 MG capsule Take 300 mg by mouth at bedtime.    [name] ketoconazole (NIZORAL) 2 % cream Apply 1 application topically daily.    . metFORMIN (GLUCOPHAGE-XR) 500 MG 24 hr tablet Take 1 tablet (500 mg total) by mouth daily with breakfast. 30 tablet 0  . moxifloxacin (VIGAMOX) 0.5 % ophthalmic solution 1 drop 3 (three) times daily.    [name] omeprazole (PRILOSEC) 20 MG capsule Take 20 mg by mouth at bedtime.    [name] PRESCRIPTION MEDICATION Apply 1 application topically daily. Cream to feet    . PRESCRIPTION MEDICATION Apply 1 application topically daily. Cream to back    . RHOPRESSA 0.02 % SOLN INT 1 GTT IN OU Q NIGHT  3  . sildenafil (VIAGRA) 100 MG tablet Take 100 mg by mouth daily as needed for erectile dysfunction.    . simvastatin (ZOCOR) 20 MG tablet Take 10 mg by mouth at bedtime.    . terbinafine (LAMISIL) 1 % cream Apply 1 application topically 2 (two) times daily.    [name] triamcinolone ointment (KENALOG) 0.1 % Apply 1 application topically 2 (two) times daily.    [name] triamterene-hydrochlorothiazide (MAXZIDE) 75-50 MG per tablet Take 0.5 tablets by mouth daily.     No current facility-administered medications on file prior to visit.     PAST MEDICAL HISTORY: Past Medical History:  Diagnosis Date  . Arthritis   . Chronic back pain   . Chronic kidney disease   . Cough   . Diabetes mellitus type II, controlled ([place])   . Dry skin   . Floaters in visual field   . GERD (gastroesophageal reflux disease)   . Glaucoma, both eyes   . Hay fever   . Heartburn   . Hyperlipemia   . Hypertension   . Itching   .  Joint pain   . Kidney disease   . Lactose intolerance   . Leg cramp   . Low back pain   . Multiple food allergies   . Muscle pain   . Neuropathy    rt leg from DDD  . Rash   . Sleep apnea    uses a cpap  . Stiff neck   . Swelling of lower extremity   . Trouble in sleeping   . Vision changes   . Vitamin D deficiency   . Wears glasses   . Wears partial dentures    top mid partial-flipper    PAST SURGICAL HISTORY: Past Surgical History:  Procedure Laterality Date  . COLONOSCOPY    . DENTAL SURGERY     extractions  . EYE SURGERY    . ORIF PATELLA Right 01/24/2013   Procedure:  OPEN REDUCTION INTERNAL (ORIF) FIXATION PATELLA;  Surgeon: Kerin Salen, MD;  Location: [place];  Service: Orthopedics;  Laterality: Right;  orif right patella,patellectomy     SOCIAL HISTORY: Social History   Tobacco Use  . Smoking status: Never Smoker  . Smokeless tobacco: Never Used  Substance Use Topics  . Alcohol use: No  . Drug use: No    FAMILY HISTORY: Family History  Problem Relation Age of Onset  . Arthritis Mother   . Hearing loss Mother   . Hyperlipidemia Mother   . Hypertension Mother   . Diabetes Mother   . Cancer Father   . Hypertension Father   . Diabetes Brother   . Hyperlipidemia Brother   . Hypertension Brother     ROS: Review of Systems  Constitutional: Negative for weight loss.  Cardiovascular: Negative for chest pain.  Neurological: Negative for dizziness and headaches.       Negative for syncope    PHYSICAL EXAM: Pt in no acute distress  RECENT LABS AND TESTS: BMET    Component Value Date/Time   NA 137 09/13/2018 0845   K 3.7 09/13/2018 0845   CL 102 09/13/2018 0845   CO2 27 09/13/2018 0845   GLUCOSE 99 09/13/2018 0845   BUN 24 (H) 09/13/2018 0845   CREATININE 1.17 09/13/2018 0845   CALCIUM 10.0 09/13/2018 0845   Lab Results  Component Value Date   HGBA1C 6.2 (H) 05/08/2018   HGBA1C 7.6 (H) 01/27/2018   Lab Results   Component Value Date   INSULIN 19.3 05/08/2018   CBC    Component Value Date/Time   WBC 5.2 01/20/2018 1057   RBC 4.28 01/20/2018 1057   HGB 13.0 01/20/2018 1057   HCT 37.9 (L) 01/20/2018 1057   PLT 188.0 01/20/2018 1057   MCV 88.7 01/20/2018 1057   MCHC 34.4 01/20/2018 1057   RDW 13.6 01/20/2018 1057   Iron/TIBC/Ferritin/ %Sat No results found for: IRON, TIBC, FERRITIN, IRONPCTSAT Lipid Panel     Component Value Date/Time   CHOL 162 05/08/2018 1043   TRIG 123 05/08/2018 1043   HDL 42 05/08/2018 1043   LDLCALC 95 05/08/2018 1043   Hepatic Function Panel     Component Value Date/Time   PROT 6.7 01/20/2018 1057   ALBUMIN 4.0 01/20/2018 1057   AST 21 01/20/2018 1057   ALT 24 01/20/2018 1057   ALKPHOS 61 01/20/2018 1057   BILITOT 0.7 01/20/2018 1057      Component Value Date/Time   TSH 2.28 09/13/2018 0845   TSH 2.670 05/08/2018 1043   TSH 1.75 01/20/2018 1057     Ref. Range 05/08/2018 10:43  Vitamin D, 25-Hydroxy Latest Ref Range: 30.0 - 100.0 ng/mL 45.5    I, Doreene Nest, am acting as Location manager for Dennard Nip, MD I have reviewed the above documentation for accuracy and completeness, and I agree with the above. -Dennard Nip, MD

## 2019-03-27 ENCOUNTER — Other Ambulatory Visit: Payer: Self-pay | Admitting: *Deleted

## 2019-03-27 DIAGNOSIS — Z20822 Contact with and (suspected) exposure to covid-19: Secondary | ICD-10-CM

## 2019-03-28 ENCOUNTER — Other Ambulatory Visit: Payer: Self-pay

## 2019-03-28 DIAGNOSIS — Z20822 Contact with and (suspected) exposure to covid-19: Secondary | ICD-10-CM

## 2019-03-29 LAB — NOVEL CORONAVIRUS, NAA: SARS-CoV-2, NAA: NOT DETECTED

## 2019-04-09 ENCOUNTER — Other Ambulatory Visit: Payer: Self-pay | Admitting: Family Medicine

## 2019-04-09 MED ORDER — PRECISION XTRA BLOOD GLUCOSE VI STRP: ORAL_STRIP | 1 refills | Status: DC

## 2019-04-09 MED ORDER — EASY TOUCH LANCETS 26G MISC: 1 refills | Status: AC

## 2019-04-09 NOTE — Telephone Encounter (Signed)
Requested medication (s) are due for refill today:  yes  Requested medication (s) are on the active medication list:  Yes; device kit is on medication list  Future visit scheduled:  no  Last Refill: device kit ordered 01/2018  Note to clinic- I called pt. To clarify the specific equipment needed; he has a Precision XTRA Glucose monitor.   Requested Prescriptions  Pending Prescriptions Disp Refills   glucose blood (PRECISION XTRA TEST STRIPS) test strip 100 each 1    Sig: Use as directed daily.     Endocrinology: Diabetes - Testing Supplies Passed - 04/09/2019  8:56 AM      Passed - Valid encounter within last 12 months    Recent Outpatient Visits          1 month ago Bradycardia   [facility] [place] Matthews, [place], DO   6 months ago Bradycardia   [facility] [place] Matthews, [place], DO   11 months ago Type 2 diabetes mellitus without complication, without long-term current use of insulin [facility])   [facility] [place], [place], DO   1 year ago Essential hypertension   [facility] [place] Mathews, [place], DO   1 year ago Type 2 diabetes mellitus without complication, without long-term current use of insulin ([place])   [facility] [place], [place], DO      Future Appointments            In 3 weeks Nahser, Wonda Cheng, MD [facility]            Easy Touch Lancets 26G MISC 100 each 1    Sig: Use as directed daily     There is no refill protocol information for this order

## 2019-04-09 NOTE — Telephone Encounter (Signed)
Medication Refill - Medication: precision test strips and lancets  Has the patient contacted their pharmacy? No. (Agent: If no, request that the patient contact the pharmacy for the refill.) (Agent: If yes, when and what did the pharmacy advise?)  Preferred Pharmacy (with phone number or street name): [facility] ( new pharm)  Agent: Please be advised that RX refills may take up to 3 business days. We ask that you follow-up with your pharmacy.

## 2019-04-09 NOTE — Telephone Encounter (Signed)
Phone call to pt. To clarify the test strips and lancets he needs for his device.  Stated he has a Precision XTRA glucose monitor and an Easy touch lancet device.  Stated [facility] needs [facility] [address], as he checked different pharmacies, and they have the products he needs.

## 2019-04-12 NOTE — Telephone Encounter (Signed)
Callled [facility] and they state they need the diagnosis codes to fill the prescription for   glucose blood (PRECISION XTRA TEST STRIPS) test strip 100 each

## 2019-04-12 NOTE — Telephone Encounter (Signed)
Patient is waiting on the status of the corrected prescription being sent to [facility].  Please advise.

## 2019-04-13 DIAGNOSIS — Z23 Encounter for immunization: Secondary | ICD-10-CM | POA: Diagnosis not present

## 2019-04-13 MED ORDER — PRECISION XTRA BLOOD GLUCOSE VI STRP: ORAL_STRIP | 1 refills | Status: AC

## 2019-04-13 NOTE — Telephone Encounter (Signed)
Rx re-sent to pharmacy with dx code.

## 2019-04-13 NOTE — Addendum Note (Signed)
Addended by: Rodrigo Ran on: 04/13/2019 09:49 AM   Modules accepted: Orders

## 2019-04-13 NOTE — Addendum Note (Signed)
Addended by: Brigitte Pulse on: 04/13/2019 03:16 PM   Modules accepted: Orders

## 2019-04-22 ENCOUNTER — Other Ambulatory Visit: Payer: Self-pay | Admitting: Family Medicine

## 2019-04-22 DIAGNOSIS — E119 Type 2 diabetes mellitus without complications: Secondary | ICD-10-CM

## 2019-04-24 ENCOUNTER — Encounter: Payer: Self-pay | Admitting: Neurology

## 2019-04-25 ENCOUNTER — Encounter: Payer: Self-pay | Admitting: Neurology

## 2019-04-25 ENCOUNTER — Ambulatory Visit (INDEPENDENT_AMBULATORY_CARE_PROVIDER_SITE_OTHER): Payer: Medicare Other | Admitting: Neurology

## 2019-04-25 ENCOUNTER — Other Ambulatory Visit: Payer: Self-pay

## 2019-04-25 VITALS — BP 112/71 | HR 48 | Ht 70.0 in | Wt 231.0 lb

## 2019-04-25 DIAGNOSIS — Z9989 Dependence on other enabling machines and devices: Secondary | ICD-10-CM

## 2019-04-25 DIAGNOSIS — R634 Abnormal weight loss: Secondary | ICD-10-CM | POA: Diagnosis not present

## 2019-04-25 DIAGNOSIS — G4733 Obstructive sleep apnea (adult) (pediatric): Secondary | ICD-10-CM

## 2019-04-25 NOTE — Patient Instructions (Signed)
Please continue using your CPAP regularly. While your insurance requires that you use CPAP at least 4 hours each night on 70% of the nights, I recommend, that you not skip any nights and use it throughout the night if you can. Getting used to CPAP and staying with the treatment long term does take time and patience and discipline. Untreated obstructive sleep apnea when it is moderate to severe can have an adverse impact on cardiovascular health and raise her risk for heart disease, arrhythmias, hypertension, congestive heart failure, stroke and diabetes. Untreated obstructive sleep apnea causes sleep disruption, nonrestorative sleep, and sleep deprivation. This can have an impact on your day to day functioning and cause daytime sleepiness and impairment of cognitive function, memory loss, mood disturbance, and problems focussing. Using CPAP regularly can improve these symptoms.  We will reduce your pressure from 16 cm to 14 cm at this time.  Please call or email [name] in about 4 to 6 weeks so we can review another download on the new pressure to see if your sleep apnea is still under good control on the lower pressure.  Continue to work on weight loss.  I plan to follow you in a year's time.

## 2019-04-25 NOTE — Progress Notes (Signed)
Subjective:    Patient ID: Tanner Young is a 63 y.o. male.  HPI     Interim history:   Tanner Young is a 63 year old right-handed gentleman with an underlying medical history of hypertension, reflux disease, chronic low back pain, arthritis, glaucoma, and obesity, who presents for follow-up consultation of his obstructive sleep apnea, on CPAP therapy. The patient is unaccompanied today. I last saw him on 10/23/2018, at which time he was compliant with treatment.  He had lost quite a bit of weight.  He was advised to continue with treatment and we considered repeating his sleep study at some point in the near future to reevaluate his sleep apnea and the need for ongoing CPAP therapy.    Today, 04/25/2019: I reviewed his CPAP compliance data from 03/26/2019 through 04/24/2019 which is a total of 30 days, during which time he used his machine every night with percent use days greater than 4 hours 100%, indicating superb compliance with an average usage of 4 hours and 51 minutes, residual AHI 2.3/h, leak on the high side with a 95th percentile at 19.5 L/min on a pressure of 16 cm with EPR of 3.  He has continued to work on weight loss.  He sleeps reasonably well with his CPAP but at some point would like to see if he can come off of it.  He would be willing to consider a pressure reduction at this time.  He uses a fullface mask in general he is up-to-date with his supplies, needs to reorder some.  He has been noted to have bradycardia, he has been seen at the [facility] for this and had in the past few months work-up in the form of echocardiogram and 2-week heart monitor.  He was told that during sleep his heart rate tends to drop into the low 40s and perhaps even below 40.  He has had a couple of instances of lightheadedness but otherwise no significant symptoms, resting heart rate during the day is in the mid to high 40s. Weight at the time of his split-night sleep study in January 2019 was 302 pounds.  He reports that  he had gone up to 319 pounds at 1 point, currently he is at 231 pounds today.  The patient's allergies, current medications, family history, past medical history, past social history, past surgical history and problem list were reviewed and updated as appropriate.    Previously:      I saw him on 11/21/2017, at which time we talked about his sleep study results including his split-night sleep study from 09/15/2017 as well as his separate CPAP titration study on 10/18/2017. He was on CPAP. He was compliant with it. He reported feeling better. He was less sleepy during the day, he was using Norco as needed.    He saw Cecille Rubin, nurse practitioner in the interim on 05/23/2018 at which time he was compliant with treatment.    I reviewed his CPAP compliance data from 09/20/2018 through 10/19/2018 which is a total of 30 days, during which time he used his CPAP 26 days with percent used days greater than 4 hours at 83%, indicating very good compliance with an average usage of 5 hours and 12 minutes, residual AHI at goal at 2.2 per hour, leak acceptable with the 95th percentile at 9.4 L/m on a pressure of 16 cm with EPR of 3.    I first met him on 08/02/2017 at the request of his dentist, at which time he reported  a prior diagnosis of obstructive sleep apnea but he does not using CPAP. He was invited for sleep study testing. He had a split-night sleep study, followed by a full night titration study. I went over his test results with him in detail today. Split-night sleep study from 09/15/2017 showed a baseline sleep efficiency of 92%, sleep latency was 4 minutes, REM sleep was absent. He had a elevated AHI of 70 per hour, average oxygen saturation of 96%, nadir was 73%. He was then fitted with a medium fullface mask. CPAP was started at 5 cm and advanced to 18 cm. He did not achieve any sleep on the final pressure, on a treatment pressure of 16 cm his AHI was highly elevated. Since he did not have a fully  successful titration portion of the study, he was advised to return for a full night CPAP titration test separately. He had this on 10/18/2017, sleep efficiency was 90.4%, sleep latency 7.5 minutes and REM latency was 46.5 minutes. CPAP was initiated at 8 cm and titrated to 16 cm. On the final titration pressure his AHI was 0 per hour, supine REM sleep was achieved and with a nadir was 90%. He had no slow-wave sleep during the study but REM sleep was 25.4%. He had no significant PLMS. Based on his test results he was advised to start home CPAP therapy at a pressure of 16 cm. We cautioned him regarding ongoing use of narcotic pain medication and the importance of losing weight to reduce the severity of his sleep-disordered breathing.    I reviewed his CPAP compliance data from 10/19/2017 to 11/17/2017 which is a total of 30 days, during which time he used his CPAP 30 days with percent used days greater than 4 hours at 100%, indicating superb compliance with an average usage of 7 hours and 10 minutes, residual AHI at goal at 3.4 per hour, leak acceptable with the 95th percentile at 11.7 L/m on a pressure of 16 cm with EPR of 3.    08/02/2017: (He) was previously diagnosed with obstructive sleep apnea. Prior sleep study results are not available for my review today. I reviewed your office notes, which you kindly included. He has been on CPAP therapy, but has not been using his machine very often. A CPAP download was reviewed today from 06/28/2016 through 06/22/2017 which is a total of one year during which time he used his CPAP 4 days, indicating noncompliance, pressure set at 16 cm. Leak was very high with the 95th percentile at 90.9 L/m. Residual AHI at 4.5 per hour. His Epworth sleepiness score is 15 out of 24 today, fatigue score is 44 out of 63. He is married and lives with his wife. They have 5 children. He is a nonsmoker and drinks alcohol in the form of wine occasionally. He drinks caffeine in the form of  tea. He has no telltale hx of RLS symptoms. No FHx known of OSA to him. He does not wake up rested and per wife is very restless, has breathing pauses and very loud snoring, which bothers her. He has no night to night nocturia. He does not typically have morning headaches. He has been having allergies and congestion and difficulty breathing through his nose, he has had drainage. He has most of his care through the [place] and has seen an allergy specialist as well. As far as his CPAP, he eventually gave up on it because it was difficult for him to use it, the mask was leaking.  He believes that he had sleep study testing nearly 10 years ago. His current machine is at least 66 years old. He has used a wide Mirage Fx nasal mask.   His Past Medical History Is Significant For: Past Medical History:  Diagnosis Date  . Arthritis   . Chronic back pain   . Chronic kidney disease   . Cough   . Diabetes mellitus type II, controlled ([place])   . Dry skin   . Floaters in visual field   . GERD (gastroesophageal reflux disease)   . Glaucoma, both eyes   . Hay fever   . Heartburn   . Hyperlipemia   . Hypertension   . Itching   . Joint pain   . Kidney disease   . Lactose intolerance   . Leg cramp   . Low back pain   . Multiple food allergies   . Muscle pain   . Neuropathy    rt leg from DDD  . Rash   . Sleep apnea    uses a cpap  . Stiff neck   . Swelling of lower extremity   . Trouble in sleeping   . Vision changes   . Vitamin D deficiency   . Wears glasses   . Wears partial dentures    top mid partial-flipper    His Past Surgical History Is Significant For: Past Surgical History:  Procedure Laterality Date  . COLONOSCOPY    . DENTAL SURGERY     extractions  . EYE SURGERY    . ORIF PATELLA Right 01/24/2013   Procedure: OPEN REDUCTION INTERNAL (ORIF) FIXATION PATELLA;  Surgeon: Kerin Salen, MD;  Location: [place];  Service: Orthopedics;  Laterality: Right;  orif right  patella,patellectomy     His Family History Is Significant For: Family History  Problem Relation Age of Onset  . Arthritis Mother   . Hearing loss Mother   . Hyperlipidemia Mother   . Hypertension Mother   . Diabetes Mother   . Cancer Father   . Hypertension Father   . Diabetes Brother   . Hyperlipidemia Brother   . Hypertension Brother     His Social History Is Significant For: Social History   Socioeconomic History  . Marital status: Married    Spouse name: Runner, broadcasting/film/video  . Number of children: Not on file  . Years of education: Not on file  . Highest education level: Not on file  Occupational History  . Occupation: Retired  Scientific laboratory technician  . Financial resource strain: Not on file  . Food insecurity    Worry: Not on file    Inability: Not on file  . Transportation needs    Medical: Not on file    Non-medical: Not on file  Tobacco Use  . Smoking status: Never Smoker  . Smokeless tobacco: Never Used  Substance and Sexual Activity  . Alcohol use: No  . Drug use: No  . Sexual activity: Not on file  Lifestyle  . Physical activity    Days per week: Not on file    Minutes per session: Not on file  . Stress: Not on file  Relationships  . Social Herbalist on phone: Not on file    Gets together: Not on file    Attends religious service: Not on file    Active member of club or organization: Not on file    Attends meetings of clubs or organizations: Not on file  Relationship status: Not on file  Other Topics Concern  . Not on file  Social History Narrative  . Not on file    His Allergies Are:  Allergies  Allergen Reactions  . Shellfish Allergy Itching and Nausea And Vomiting    Shrimp   :   His Current Medications Are:  Outpatient Encounter Medications as of 04/25/2019  Medication Sig  . amLODipine (NORVASC) 5 MG tablet Take 5 mg by mouth daily.  . Aspirin Buf,CaCarb-MgCarb-MgO, 81 MG TABS Take by mouth.  . benazepril (LOTENSIN) 40 MG tablet Take 40  mg by mouth daily.  . blood glucose meter kit and supplies KIT Dispense based on patient and insurance preference. Use daily as directed to monitor blood sugar. (FOR ICD-9 250.00, 250.01).  . brimonidine (ALPHAGAN) 0.15 % ophthalmic solution Place 1 drop into both eyes 2 (two) times daily at 10 AM and 5 PM.  . cetirizine (ZYRTEC) 10 MG tablet Take 10 mg by mouth daily.  . cholecalciferol (VITAMIN D) 1000 UNITS tablet Take 1,000 Units by mouth daily.  . cyclobenzaprine (FLEXERIL) 10 MG tablet Take 0.5-1 tablets (5-10 mg total) by mouth 3 (three) times daily as needed for muscle spasms.  [name] desonide (DESOWEN) 0.05 % cream Apply topically 2 (two) times daily.  . dorzolamide-timolol (COSOPT) 22.3-6.8 MG/ML ophthalmic solution Place 1 drop into both eyes 2 (two) times daily.   . Easy Touch Lancets 26G MISC Use as directed daily  . gabapentin (NEURONTIN) 300 MG capsule Take 300 mg by mouth at bedtime.  [name] glucose blood (PRECISION XTRA TEST STRIPS) test strip Use as directed daily.  [name] ketoconazole (NIZORAL) 2 % cream Apply 1 application topically daily.  . metFORMIN (GLUCOPHAGE-XR) 500 MG 24 hr tablet TAKE 1 TABLET(500 MG) BY MOUTH DAILY WITH BREAKFAST  . moxifloxacin (VIGAMOX) 0.5 % ophthalmic solution 1 drop 3 (three) times daily.  [name] omeprazole (PRILOSEC) 20 MG capsule Take 20 mg by mouth at bedtime.  [name] PRESCRIPTION MEDICATION Apply 1 application topically daily. Cream to feet  . PRESCRIPTION MEDICATION Apply 1 application topically daily. Cream to back  . RHOPRESSA 0.02 % SOLN INT 1 GTT IN OU Q NIGHT  . sildenafil (VIAGRA) 100 MG tablet Take 100 mg by mouth daily as needed for erectile dysfunction.  . simvastatin (ZOCOR) 20 MG tablet Take 10 mg by mouth at bedtime.  . terbinafine (LAMISIL) 1 % cream Apply 1 application topically 2 (two) times daily.  [name] triamcinolone ointment (KENALOG) 0.1 % Apply 1 application topically 2 (two) times daily.  [name] triamterene-hydrochlorothiazide (MAXZIDE) 75-50 MG per  tablet Take 0.5 tablets by mouth daily.   No facility-administered encounter medications on file as of 04/25/2019.   :  Review of Systems:  Out of a complete 14 point review of systems, all are reviewed and negative with the exception of these symptoms as listed below: Review of Systems  Neurological:       Pt presents today to discuss his cpap. Pt reports that his cpap is going well.    Objective:  Neurological Exam  Physical Exam Physical Examination:   Vitals:   04/25/19 0932  BP: 112/71  Pulse: (!) 48    General Examination: The patient is a very pleasant 63 y.o. male in no acute distress. He appears well-developed and well-nourished and well groomed.   HEENT:Normocephalic, atraumatic, pupils are equal, round and reactive to light. He wears corrective eyeglasses.  Extraocular tracking is good without limitation to gaze excursion or nystagmus noted. Normal smooth  pursuit is noted. Hearing is grossly intact. Face is symmetric with normal facial animation and normal facial sensation. Speech is clear with no dysarthria noted. There is no hypophonia. There is no lip, neck/head, jaw or voice tremor. Neck is supple with full range of passive and active motion. There are no carotid bruits on auscultation. Oropharynx exam reveals:no significantmouth dryness, adequatedental hygiene and moderateairway crowding. Mallampati is classII. Tongue protrudes centrally and palate elevates symmetrically.  Chest:Clear to auscultation without wheezing, rhonchi or crackles noted.  Heart:S1+S2+0, regular and normal without murmurs, rubs or gallops noted, but bradycardic.   Abdomen:Soft, non-tender and non-distended with normal bowel sounds appreciated on auscultation.  Extremities:There isnoobvious change.  Skin: Warm and dry without trophic changes noted.  Musculoskeletal: exam reveals no obvious joint deformities, tenderness or joint swelling or erythema, with the exception of  hardware in place right knee, no obvious change.  Neurologically:  Mental status: The patient is awake, alert and oriented in all 4 spheres.Hisimmediate and remote memory, attention, language skills and fund of knowledge are appropriate. There is no evidence of aphasia, agnosia, apraxia or anomia. Speech is clear with normal prosody and enunciation. Thought process is linear. Mood is normaland affect is normal.  Cranial nerves II - XII are as described above under HEENT exam.  Motor exam: Normal bulk, strength and tone is noted. There is no tremor. Fine motor skills and coordination: grossly intact.  Cerebellar testing: No dysmetria or intention tremor. There is no truncal or gait ataxia.  Sensory exam: intact to light touch in the upper and lower extremities.  Gait, station and balance:Hestands easily. No veering to one side is noted. No leaning to one side is noted. Posture is age-appropriate and stance is narrow based. Gait showsnormalstride length and normalpace. No problems turning are noted.  Assessmentand Plan:  In summary,Tanner R Watsonis a very pleasant 73 year oldmalewith an underlying medical history of hypertension, reflux disease, chronic low back pain, arthritis, glaucoma, and obesity, whopresents forfollow-up consultation of his obstructive sleep apnea. He had difficulty with his prior CPAP in the past. He had a split-night sleep study in January 2019 and a subsequent full night titration study in March 2019. He Continues to be compliant with his CPAP. He has lost weight Since his sleep study and is currently stable for the past few months.  I suggested we reduce his set pressure from 16 cm to 14 cm and reevaluate his download in about a month after the pressure change.  He is encouraged to call or email through my chart for this.  He is encouraged to continue with full compliance with his CPAP as he has severe sleep apnea.  He is also encouraged to continue to  work on weight loss.  I plan to see him back routinely in 1 year. I answered alltheirquestions today and the patient and his wifewerein agreement. I spent 20 minutes in total face-to-face time with the patient, more than 50% of which was spent in counseling and coordination of care, reviewing test results, reviewing medication and discussing or reviewing the diagnosis of OSA, its prognosis and treatment options. Pertinent laboratory and imaging test results that were available during this visit with the patient were reviewed by me and considered in my medical decision making (see chart for details).

## 2019-04-25 NOTE — Progress Notes (Signed)
Order for cpap pressure reduction sent to [facility] via community message. Confirmation received that the order transmitted was successful.

## 2019-04-30 ENCOUNTER — Encounter: Payer: Self-pay | Admitting: Cardiovascular Disease

## 2019-04-30 ENCOUNTER — Other Ambulatory Visit: Payer: Self-pay

## 2019-04-30 ENCOUNTER — Ambulatory Visit (INDEPENDENT_AMBULATORY_CARE_PROVIDER_SITE_OTHER): Payer: Medicare Other | Admitting: Cardiovascular Disease

## 2019-04-30 VITALS — BP 120/70 | HR 49 | Ht 70.0 in | Wt 235.8 lb

## 2019-04-30 DIAGNOSIS — I10 Essential (primary) hypertension: Secondary | ICD-10-CM | POA: Diagnosis not present

## 2019-04-30 DIAGNOSIS — R001 Bradycardia, unspecified: Secondary | ICD-10-CM

## 2019-04-30 NOTE — Patient Instructions (Signed)
Medication Instructions:  Your physician recommends that you continue on your current medications as directed. Please refer to the Current Medication list given to you today.  If you need a refill on your cardiac medications before your next appointment, please call your pharmacy.    Lab work: None Ordered   Testing/Procedures: None Ordered   Follow-Up: At [facility], you and your health needs are our priority.  As part of our continuing mission to provide you with exceptional heart care, we have created designated Provider Care Teams.  These Care Teams include your primary Cardiologist (physician) and Advanced Practice Providers (APPs -  Physician Assistants and Nurse Practitioners) who all work together to provide you with the care you need, when you need it. You will need a follow up appointment in:   As Needed.   You may see Dr. Acie Fredrickson or one of the following Advanced Practice Providers on your designated Care Team: Richardson Dopp, PA-C [place], [place] . Daune Perch, NP

## 2019-04-30 NOTE — Progress Notes (Signed)
Cardiology Office Note:    Date:  04/30/2019   ID:  Tanner Young, DOB 1955-11-12, MRN [id number]  PCP:  [name], DO  Cardiologist:  Yassmine Tamm   Electrophysiologist:  None   Referring MD: [name], DO   Chief Complaint  Patient presents with   Bradycardia    History of Present Illness:    Tanner Young is a 63 y.o. male with a hx of  Obesity, HTN, DM. We were asked to see him by Dr. Zigmund Daniel today for further evaluation of bradycardia.  He has never known that he had a slow HR until he got his apple watch for Christmas  Wore a 21 day monitor through the [place]. Also had an echo at the [place].   He has normal LV function , normal valvular function,   - normal echo  No syncope, no presyncope. Does yard work.   Exercises some .   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   We performed a walking challenge today . Baseline HR was 56 After walking around the office ( 500 feet) HR increased to 86.  He was able to walk at a quick pace without any limitations.   Past Medical History:  Diagnosis Date   Arthritis    Chronic back pain    Chronic kidney disease    Cough    Diabetes mellitus type II, controlled ([place])    Dry skin    Floaters in visual field    GERD (gastroesophageal reflux disease)    Glaucoma, both eyes    Hay fever    Heartburn    Hyperlipemia    Hypertension    Itching    Joint pain    Kidney disease    Lactose intolerance    Leg cramp    Low back pain    Multiple food allergies    Muscle pain    Neuropathy    rt leg from DDD   Rash    Sleep apnea    uses a cpap   Stiff neck    Swelling of lower extremity    Trouble in sleeping    Vision changes    Vitamin D deficiency    Wears glasses    Wears partial dentures    top mid partial-flipper    Past Surgical History:   Procedure Laterality Date   COLONOSCOPY     DENTAL SURGERY     extractions   EYE SURGERY     ORIF PATELLA Right 01/24/2013   Procedure: OPEN REDUCTION INTERNAL (ORIF) FIXATION PATELLA;  Surgeon: Kerin Salen, MD;  Location: [facility];  Service: Orthopedics;  Laterality: Right;  orif right patella,patellectomy     Current Medications: Current Meds  Medication Sig   amLODipine (NORVASC) 5 MG tablet Take 5 mg by mouth daily.   Aspirin Buf,CaCarb-MgCarb-MgO, 81 MG TABS Take by mouth.   benazepril (LOTENSIN) 40 MG tablet Take 40 mg by mouth daily.   blood glucose meter kit and supplies KIT Dispense based on patient and insurance preference. Use daily as directed to monitor blood sugar. (FOR ICD-9 250.00, 250.01).   brimonidine (ALPHAGAN) 0.15 % ophthalmic solution Place 1 drop into both eyes 2 (two) times daily at 10 AM and 5 PM.   cetirizine (ZYRTEC) 10 MG tablet Take 10  mg by mouth daily.   cholecalciferol (VITAMIN D) 1000 UNITS tablet Take 1,000 Units by mouth daily.   cyclobenzaprine (FLEXERIL) 10 MG tablet Take 0.5-1 tablets (5-10 mg total) by mouth 3 (three) times daily as needed for muscle spasms.   desonide (DESOWEN) 0.05 % cream Apply topically 2 (two) times daily.   dorzolamide-timolol (COSOPT) 22.3-6.8 MG/ML ophthalmic solution Place 1 drop into both eyes 2 (two) times daily.    Easy Touch Lancets 26G MISC Use as directed daily   gabapentin (NEURONTIN) 300 MG capsule Take 300 mg by mouth at bedtime.   glucose blood (PRECISION XTRA TEST STRIPS) test strip Use as directed daily.   ketoconazole (NIZORAL) 2 % cream Apply 1 application topically daily.   metFORMIN (GLUCOPHAGE-XR) 500 MG 24 hr tablet TAKE 1 TABLET(500 MG) BY MOUTH DAILY WITH BREAKFAST   moxifloxacin (VIGAMOX) 0.5 % ophthalmic solution 1 drop 3 (three) times daily.   omeprazole (PRILOSEC) 20 MG capsule Take 20 mg by mouth at bedtime.   PRESCRIPTION MEDICATION Apply 1 application  topically daily. Cream to feet   PRESCRIPTION MEDICATION Apply 1 application topically daily. Cream to back   RHOPRESSA 0.02 % SOLN INT 1 GTT IN OU Q NIGHT   sildenafil (VIAGRA) 100 MG tablet Take 100 mg by mouth daily as needed for erectile dysfunction.   simvastatin (ZOCOR) 20 MG tablet Take 10 mg by mouth at bedtime.   terbinafine (LAMISIL) 1 % cream Apply 1 application topically 2 (two) times daily.   triamcinolone ointment (KENALOG) 0.1 % Apply 1 application topically 2 (two) times daily.   triamterene-hydrochlorothiazide (MAXZIDE) 75-50 MG per tablet Take 0.5 tablets by mouth daily.     Allergies:   Shellfish allergy   Social History   Socioeconomic History   Marital status: Married    Spouse name: [name]   Number of children: Not on file   Years of education: Not on file   Highest education level: Not on file  Occupational History   Occupation: Retired  Scientist, product/process development strain: Not on file   Food insecurity    Worry: Not on file    Inability: Not on Lexicographer needs    Medical: Not on file    Non-medical: Not on file  Tobacco Use   Smoking status: Never Smoker   Smokeless tobacco: Never Used  Substance and Sexual Activity   Alcohol use: No   Drug use: No   Sexual activity: Not on file  Lifestyle   Physical activity    Days per week: Not on file    Minutes per session: Not on file   Stress: Not on file  Relationships   Social connections    Talks on phone: Not on file    Gets together: Not on file    Attends religious service: Not on file    Active member of club or organization: Not on file    Attends meetings of clubs or organizations: Not on file    Relationship status: Not on file  Other Topics Concern   Not on file  Social History Narrative   Not on file     Family History: The patient's family history includes Arthritis in his mother; Cancer in his father; Diabetes in his brother and mother;  Hearing loss in his mother; Hyperlipidemia in his brother and mother; Hypertension in his brother, father, and mother.  ROS:   Please see the history of present illness.     All  other systems reviewed and are negative.  EKGs/Labs/Other Studies Reviewed:    The following studies were reviewed today:   EKG:   Sept. 14 , 2020 :   Sinus brady at 53.   NS T abn.   Recent Labs: 09/13/2018: BUN 24; Creatinine, Ser 1.17; Potassium 3.7; Sodium 137; TSH 2.28  Recent Lipid Panel    Component Value Date/Time   CHOL 162 05/08/2018 1043   TRIG 123 05/08/2018 1043   HDL 42 05/08/2018 1043   LDLCALC 95 05/08/2018 1043    Physical Exam:    VS:  BP 120/70    Pulse (!) 49    Ht '5\' 10"'$  (1.778 m)    Wt 235 lb 12.8 oz (107 kg)    SpO2 96%    BMI 33.83 kg/m     Wt Readings from Last 3 Encounters:  04/30/19 235 lb 12.8 oz (107 kg)  04/25/19 231 lb (104.8 kg)  02/21/19 226 lb (102.5 kg)     GEN:  Well nourished, well developed in no acute distress HEENT: Normal NECK: No JVD; No carotid bruits LYMPHATICS: No lymphadenopathy CARDIAC: RRR,  Bradycardia  RESPIRATORY:  Clear to auscultation without rales, wheezing or rhonchi  ABDOMEN: Soft, non-tender, non-distended MUSCULOSKELETAL:  No edema; No deformity  SKIN: Warm and dry NEUROLOGIC:  Alert and oriented x 3 PSYCHIATRIC:  Normal affect   ASSESSMENT:    1. Sinus bradycardia   2. Essential hypertension    PLAN:      1. Sinus bradycardia: Tanner Young presents today for further evaluation of his benign sinus bradycardia.  He has an intact chronotropic response to exercise.  He denies any symptoms of chest pain, syncope, presyncope.  At this point he does not need any additional work-up.  Of advised him to call us if he has any episodes of syncope or presyncope or profound fatigue.  Otherwise I have encouraged him to continue to stay active and to continue with his weight loss efforts.  2.  Hypertension: Blood pressure is well controlled.  3.   Glaucoma: I reviewed his eyedrops and I do not see any alpha blockers.  We should try to avoid alpha blockers as they might tend to slow his heart rate slightly.  We will see him on an as-needed basis.   Medication Adjustments/Labs and Tests Ordered: Current medicines are reviewed at length with the patient today.  Concerns regarding medicines are outlined above.  Orders Placed This Encounter  Procedures   EKG 12-Lead   No orders of the defined types were placed in this encounter.   Patient Instructions  Medication Instructions:  Your physician recommends that you continue on your current medications as directed. Please refer to the Current Medication list given to you today.  If you need a refill on your cardiac medications before your next appointment, please call your pharmacy.    Lab work: None Ordered   Testing/Procedures: None Ordered   Follow-Up: At [facility], you and your health needs are our priority.  As part of our continuing mission to provide you with exceptional heart care, we have created designated Provider Care Teams.  These Care Teams include your primary Cardiologist (physician) and Advanced Practice Providers (APPs -  Physician Assistants and Nurse Practitioners) who all work together to provide you with the care you need, when you need it. You will need a follow up appointment in:   As Needed.   You may see Dr. Acie Fredrickson or one of the following Advanced Practice Providers  on your designated Care Team: Richardson Dopp, PA-C [place], [place]  Daune Perch, [place]      Signed, Mertie Moores, MD  04/30/2019 9:23 AM    [place]

## 2019-05-11 DIAGNOSIS — H4052X2 Glaucoma secondary to other eye disorders, left eye, moderate stage: Secondary | ICD-10-CM | POA: Diagnosis not present

## 2019-05-11 DIAGNOSIS — H4051X3 Glaucoma secondary to other eye disorders, right eye, severe stage: Secondary | ICD-10-CM | POA: Diagnosis not present

## 2019-05-15 DIAGNOSIS — E119 Type 2 diabetes mellitus without complications: Secondary | ICD-10-CM | POA: Diagnosis not present

## 2019-05-15 DIAGNOSIS — H353131 Nonexudative age-related macular degeneration, bilateral, early dry stage: Secondary | ICD-10-CM | POA: Diagnosis not present

## 2019-05-15 DIAGNOSIS — H35371 Puckering of macula, right eye: Secondary | ICD-10-CM | POA: Diagnosis not present

## 2019-05-15 DIAGNOSIS — H04123 Dry eye syndrome of bilateral lacrimal glands: Secondary | ICD-10-CM | POA: Diagnosis not present

## 2019-05-15 DIAGNOSIS — H524 Presbyopia: Secondary | ICD-10-CM | POA: Diagnosis not present

## 2019-05-15 DIAGNOSIS — H401132 Primary open-angle glaucoma, bilateral, moderate stage: Secondary | ICD-10-CM | POA: Diagnosis not present

## 2019-05-15 DIAGNOSIS — H35033 Hypertensive retinopathy, bilateral: Secondary | ICD-10-CM | POA: Diagnosis not present

## 2019-05-15 DIAGNOSIS — Z961 Presence of intraocular lens: Secondary | ICD-10-CM | POA: Diagnosis not present

## 2019-05-15 LAB — HM DIABETES EYE EXAM

## 2019-05-18 ENCOUNTER — Encounter: Payer: Self-pay | Admitting: Family Medicine

## 2019-06-07 ENCOUNTER — Other Ambulatory Visit: Payer: Self-pay

## 2019-06-08 ENCOUNTER — Encounter: Payer: Self-pay | Admitting: Family Medicine

## 2019-06-08 ENCOUNTER — Ambulatory Visit (INDEPENDENT_AMBULATORY_CARE_PROVIDER_SITE_OTHER): Payer: Medicare Other | Admitting: Family Medicine

## 2019-06-08 VITALS — BP 110/80 | HR 45 | Temp 98.2°F | Ht 70.0 in | Wt 233.2 lb

## 2019-06-08 DIAGNOSIS — R001 Bradycardia, unspecified: Secondary | ICD-10-CM | POA: Diagnosis not present

## 2019-06-08 DIAGNOSIS — E119 Type 2 diabetes mellitus without complications: Secondary | ICD-10-CM

## 2019-06-08 DIAGNOSIS — E785 Hyperlipidemia, unspecified: Secondary | ICD-10-CM

## 2019-06-08 DIAGNOSIS — I10 Essential (primary) hypertension: Secondary | ICD-10-CM | POA: Diagnosis not present

## 2019-06-08 LAB — COMPREHENSIVE METABOLIC PANEL
ALT: 20 U/L (ref 0–53)
AST: 18 U/L (ref 0–37)
Albumin: 4.5 g/dL (ref 3.5–5.2)
Alkaline Phosphatase: 38 U/L — ABNORMAL LOW (ref 39–117)
BUN: 20 mg/dL (ref 6–23)
CO2: 24 mEq/L (ref 19–32)
Calcium: 9.4 mg/dL (ref 8.4–10.5)
Chloride: 104 mEq/L (ref 96–112)
Creatinine, Ser: 1.13 mg/dL (ref 0.40–1.50)
GFR: 78.84 mL/min (ref >60.00)
Glucose, Bld: 101 mg/dL — ABNORMAL HIGH (ref 70–99)
Potassium: 4.1 mEq/L (ref 3.5–5.1)
Sodium: 136 mEq/L (ref 135–145)
Total Bilirubin: 0.9 mg/dL (ref 0.2–1.2)
Total Protein: 6.9 g/dL (ref 6.0–8.3)

## 2019-06-08 LAB — LIPID PANEL
Cholesterol: 178 mg/dL (ref 0–200)
HDL: 56.5 mg/dL (ref >39.00)
LDL Cholesterol: 111 mg/dL — ABNORMAL HIGH (ref 0–99)
NonHDL: 121.58
Total CHOL/HDL Ratio: 3
Triglycerides: 55 mg/dL (ref 0.0–149.0)
VLDL: 11 mg/dL (ref 0.0–40.0)

## 2019-06-08 LAB — HEMOGLOBIN A1C: Hgb A1c MFr Bld: 5.8 % (ref 4.6–6.5)

## 2019-06-08 NOTE — Assessment & Plan Note (Signed)
-  Doing well with simvastatin, continue.  Update lipid panel today.

## 2019-06-08 NOTE — Assessment & Plan Note (Signed)
-  BP is well controlled at this time, he will continue current medications.  -Continue low sodium diet.

## 2019-06-08 NOTE — Assessment & Plan Note (Signed)
-  Doing well with metformin, will continue and update A999333 and metabolic panel today.

## 2019-06-08 NOTE — Progress Notes (Signed)
Tanner Young - 63 y.o. male MRN [id number]  Date of birth: 01/06/1956  Subjective Chief Complaint  Patient presents with  . Follow-up    A1c check. general Follow up     HPI Tanner Young is a 63 y.o. male with history of T2DM, HTN, and OSA here today for follow up. Since his last visit with me he has seen cardiology for evaluation of asymptomatic bradycardia.  His work up for this was reassuring and he his HR responded appropriately to exercise.   -T2DM:  Current treatment with metformin.  He is doing well with this.  He had made lifestyle changes over the past year and weight is down about 30lbs.  Home blood sugars have been well controlled.    -HTN:  Current management with amlodipine, benazepril and maxzide.  BP is well controlled with current medications. He denies symptoms of hypotension.  He follows a low salt diet.   -HLD:  Tolerating simvastatin well, denies myalgias.   ROS:  A comprehensive ROS was completed and negative except as noted per HPI   Allergies  Allergen Reactions  . Shellfish Allergy Itching and Nausea And Vomiting    Shrimp     Past Medical History:  Diagnosis Date  . Arthritis   . Chronic back pain   . Chronic kidney disease   . Cough   . Diabetes mellitus type II, controlled ([place])   . Dry skin   . Floaters in visual field   . GERD (gastroesophageal reflux disease)   . Glaucoma, both eyes   . Hay fever   . Heartburn   . Hyperlipemia   . Hypertension   . Itching   . Joint pain   . Kidney disease   . Lactose intolerance   . Leg cramp   . Low back pain   . Multiple food allergies   . Muscle pain   . Neuropathy    rt leg from DDD  . Rash   . Sleep apnea    uses a cpap  . Stiff neck   . Swelling of lower extremity   . Trouble in sleeping   . Vision changes   . Vitamin D deficiency   . Wears glasses   . Wears partial dentures    top mid partial-flipper    Past Surgical History:  Procedure Laterality Date  . COLONOSCOPY    .  DENTAL SURGERY     extractions  . EYE SURGERY    . ORIF PATELLA Right 01/24/2013   Procedure: OPEN REDUCTION INTERNAL (ORIF) FIXATION PATELLA;  Surgeon: Kerin Salen, MD;  Location: [facility];  Service: Orthopedics;  Laterality: Right;  orif right patella,patellectomy     Social History   Socioeconomic History  . Marital status: Married    Spouse name: Tanner Young  . Number of children: Not on file  . Years of education: Not on file  . Highest education level: Not on file  Occupational History  . Occupation: Retired  Scientific laboratory technician  . Financial resource strain: Not on file  . Food insecurity    Worry: Not on file    Inability: Not on file  . Transportation needs    Medical: Not on file    Non-medical: Not on file  Tobacco Use  . Smoking status: Never Smoker  . Smokeless tobacco: Never Used  Substance and Sexual Activity  . Alcohol use: No  . Drug use: No  . Sexual activity: Not on file  Lifestyle  . Physical activity    Days per week: Not on file    Minutes per session: Not on file  . Stress: Not on file  Relationships  . Social Herbalist on phone: Not on file    Gets together: Not on file    Attends religious service: Not on file    Active member of club or organization: Not on file    Attends meetings of clubs or organizations: Not on file    Relationship status: Not on file  Other Topics Concern  . Not on file  Social History Narrative  . Not on file    Family History  Problem Relation Age of Onset  . Arthritis Mother   . Hearing loss Mother   . Hyperlipidemia Mother   . Hypertension Mother   . Diabetes Mother   . Cancer Father   . Hypertension Father   . Diabetes Brother   . Hyperlipidemia Brother   . Hypertension Brother     Health Maintenance  Topic Date Due  . Hepatitis C Screening  1955/09/29  . FOOT EXAM  08/06/1964  . HIV Screening  08/06/1969  . TETANUS/TDAP  08/06/1973  . HEMOGLOBIN A1C  03/16/2019  .  OPHTHALMOLOGY EXAM  05/14/2020  . COLONOSCOPY  09/21/2028  . INFLUENZA VACCINE  Completed    ----------------------------------------------------------------------------------------------------------------------------------------------------------------------------------------------------------------- Physical Exam BP 110/80   Pulse (!) 45   Temp 98.2 F (36.8 C) (Oral)   Ht 5\' 10"  (1.778 m)   Wt 233 lb 3.2 oz (105.8 kg)   SpO2 98%   BMI 33.46 kg/m   Physical Exam Constitutional:      Appearance: Normal appearance.  HENT:     Head: Normocephalic and atraumatic.     Mouth/Throat:     Mouth: Mucous membranes are moist.  Eyes:     General: No scleral icterus. Neck:     Musculoskeletal: Neck supple.  Cardiovascular:     Rate and Rhythm: Normal rate and regular rhythm.     Pulses: Normal pulses.  Pulmonary:     Effort: Pulmonary effort is normal.     Breath sounds: Normal breath sounds.  Skin:    General: Skin is warm and dry.     Findings: No rash.  Neurological:     General: No focal deficit present.     Mental Status: He is alert.  Psychiatric:        Mood and Affect: Mood normal.        Behavior: Behavior normal.    Diabetic Foot Exam - Simple   Simple Foot Form Diabetic Foot exam was performed with the following findings: Yes 06/08/2019  9:47 AM  Visual Inspection No deformities, no ulcerations, no other skin breakdown bilaterally: Yes Sensation Testing Intact to touch and monofilament testing bilaterally: Yes Pulse Check Posterior Tibialis and Dorsalis pulse intact bilaterally: Yes Comments       ------------------------------------------------------------------------------------------------------------------------------------------------------------------------------------------------------------------- Assessment and Plan  Essential hypertension -BP is well controlled at this time, he will continue current medications.  -Continue low sodium diet.    Type 2 diabetes mellitus without complication, without long-term current use of insulin ([place]) -Doing well with metformin, will continue and update A999333 and metabolic panel today.   HLD (hyperlipidemia) -Doing well with simvastatin, continue.  Update lipid panel today.   Bradycardia -Asymptomatic.  Recent cardiology evaluation is reassuring as well.

## 2019-06-08 NOTE — Assessment & Plan Note (Signed)
-  Asymptomatic.  Recent cardiology evaluation is reassuring as well.

## 2019-06-08 NOTE — Patient Instructions (Signed)
Diabetes Mellitus and Standards of Medical Care Managing diabetes (diabetes mellitus) can be complicated. Your diabetes treatment may be managed by a team of health care providers, including:  A physician who specializes in diabetes (endocrinologist).  A nurse practitioner or physician assistant.  Nurses.  A diet and nutrition specialist (registered dietitian).  A certified diabetes educator (CDE).  An exercise specialist.  A pharmacist.  An eye doctor.  A foot specialist (podiatrist).  A dentist.  A primary care provider.  A mental health provider. Your health care providers follow guidelines to help you get the best quality of care. The following schedule is a general guideline for your diabetes management plan. Your health care providers may give you more specific instructions. Physical exams Upon being diagnosed with diabetes mellitus, and each year after that, your health care provider will ask about your medical and family history. He or she will also do a physical exam. Your exam may include:  Measuring your height, weight, and body mass index (BMI).  Checking your blood pressure. This will be done at every routine medical visit. Your target blood pressure may vary depending on your medical conditions, your age, and other factors.  Thyroid gland exam.  Skin exam.  Screening for damage to your nerves (peripheral neuropathy). This may include checking the pulse in your legs and feet and checking the level of sensation in your hands and feet.  A complete foot exam to inspect the structure and skin of your feet, including checking for cuts, bruises, redness, blisters, sores, or other problems.  Screening for blood vessel (vascular) problems, which may include checking the pulse in your legs and feet and checking your temperature. Blood tests Depending on your treatment plan and your personal needs, you may have the following tests done:  HbA1c (hemoglobin A1c). This  test provides information about blood sugar (glucose) control over the previous 2-3 months. It is used to adjust your treatment plan, if needed. This test will be done: ? At least 2 times a year, if you are meeting your treatment goals. ? 4 times a year, if you are not meeting your treatment goals or if treatment goals have changed.  Lipid testing, including total, LDL, and HDL cholesterol and triglyceride levels. ? The goal for LDL is less than 100 mg/dL (5.5 mmol/L). If you are at high risk for complications, the goal is less than 70 mg/dL (3.9 mmol/L). ? The goal for HDL is 40 mg/dL (2.2 mmol/L) or higher for men and 50 mg/dL (2.8 mmol/L) or higher for women. An HDL cholesterol of 60 mg/dL (3.3 mmol/L) or higher gives some protection against heart disease. ? The goal for triglycerides is less than 150 mg/dL (8.3 mmol/L).  Liver function tests.  Kidney function tests.  Thyroid function tests. Dental and eye exams  Visit your dentist two times a year.  If you have type 1 diabetes, your health care provider may recommend an eye exam 3-5 years after you are diagnosed, and then once a year after your first exam. ? For children with type 1 diabetes, a health care provider may recommend an eye exam when your child is age 10 or older and has had diabetes for 3-5 years. After the first exam, your child should get an eye exam once a year.  If you have type 2 diabetes, your health care provider may recommend an eye exam as soon as you are diagnosed, and then once a year after your first exam. Immunizations   The  yearly flu (influenza) vaccine is recommended for everyone 6 months or older who has diabetes.  The pneumonia (pneumococcal) vaccine is recommended for everyone 2 years or older who has diabetes. If you are 65 or older, you may get the pneumonia vaccine as a series of two separate shots.  The hepatitis B vaccine is recommended for adults shortly after being diagnosed with diabetes.   Adults and children with diabetes should receive all other vaccines according to age-specific recommendations from the Centers for Disease Control and Prevention (CDC). Mental and emotional health Screening for symptoms of eating disorders, anxiety, and depression is recommended at the time of diagnosis and afterward as needed. If your screening shows that you have symptoms (positive screening result), you may need more evaluation and you may work with a mental health care provider. Treatment plan Your treatment plan will be reviewed at every medical visit. You and your health care provider will discuss:  How you are taking your medicines, including insulin.  Any side effects you are experiencing.  Your blood glucose target goals.  The frequency of your blood glucose monitoring.  Lifestyle habits, such as activity level as well as tobacco, alcohol, and substance use. Diabetes self-management education Your health care provider will assess how well you are monitoring your blood glucose levels and whether you are taking your insulin correctly. He or she may refer you to:  A certified diabetes educator to manage your diabetes throughout your life, starting at diagnosis.  A registered dietitian who can create or review your personal nutrition plan.  An exercise specialist who can discuss your activity level and exercise plan. Summary  Managing diabetes (diabetes mellitus) can be complicated. Your diabetes treatment may be managed by a team of health care providers.  Your health care providers follow guidelines in order to help you get the best quality of care.  Standards of care including having regular physical exams, blood tests, blood pressure monitoring, immunizations, screening tests, and education about how to manage your diabetes.  Your health care providers may also give you more specific instructions based on your individual health. This information is not intended to replace  advice given to you by your health care provider. Make sure you discuss any questions you have with your health care provider. Document Released: 05/30/2009 Document Revised: 04/21/2018 Document Reviewed: 04/30/2016 Elsevier Patient Education  2020 Elsevier Inc.  

## 2019-06-26 DIAGNOSIS — G8929 Other chronic pain: Secondary | ICD-10-CM | POA: Diagnosis not present

## 2019-06-26 DIAGNOSIS — M5416 Radiculopathy, lumbar region: Secondary | ICD-10-CM | POA: Diagnosis not present

## 2019-06-26 DIAGNOSIS — M25552 Pain in left hip: Secondary | ICD-10-CM | POA: Diagnosis not present

## 2019-06-26 DIAGNOSIS — M545 Low back pain: Secondary | ICD-10-CM | POA: Diagnosis not present

## 2019-07-03 DIAGNOSIS — M5416 Radiculopathy, lumbar region: Secondary | ICD-10-CM | POA: Diagnosis not present

## 2019-07-03 DIAGNOSIS — M7062 Trochanteric bursitis, left hip: Secondary | ICD-10-CM | POA: Diagnosis not present

## 2019-07-03 DIAGNOSIS — M4726 Other spondylosis with radiculopathy, lumbar region: Secondary | ICD-10-CM | POA: Diagnosis not present

## 2019-07-09 DIAGNOSIS — M7062 Trochanteric bursitis, left hip: Secondary | ICD-10-CM | POA: Diagnosis not present

## 2019-07-09 DIAGNOSIS — M5416 Radiculopathy, lumbar region: Secondary | ICD-10-CM | POA: Diagnosis not present

## 2019-07-11 DIAGNOSIS — M7062 Trochanteric bursitis, left hip: Secondary | ICD-10-CM | POA: Diagnosis not present

## 2019-07-11 DIAGNOSIS — M5416 Radiculopathy, lumbar region: Secondary | ICD-10-CM | POA: Diagnosis not present

## 2019-07-16 DIAGNOSIS — M5416 Radiculopathy, lumbar region: Secondary | ICD-10-CM | POA: Diagnosis not present

## 2019-07-16 DIAGNOSIS — M7062 Trochanteric bursitis, left hip: Secondary | ICD-10-CM | POA: Diagnosis not present

## 2019-07-18 DIAGNOSIS — M5416 Radiculopathy, lumbar region: Secondary | ICD-10-CM | POA: Diagnosis not present

## 2019-07-18 DIAGNOSIS — M7062 Trochanteric bursitis, left hip: Secondary | ICD-10-CM | POA: Diagnosis not present

## 2019-07-19 DIAGNOSIS — M5416 Radiculopathy, lumbar region: Secondary | ICD-10-CM | POA: Diagnosis not present

## 2019-07-23 DIAGNOSIS — M7062 Trochanteric bursitis, left hip: Secondary | ICD-10-CM | POA: Diagnosis not present

## 2019-07-23 DIAGNOSIS — M5416 Radiculopathy, lumbar region: Secondary | ICD-10-CM | POA: Diagnosis not present

## 2019-07-31 DIAGNOSIS — H4051X3 Glaucoma secondary to other eye disorders, right eye, severe stage: Secondary | ICD-10-CM | POA: Diagnosis not present

## 2019-07-31 DIAGNOSIS — H4052X2 Glaucoma secondary to other eye disorders, left eye, moderate stage: Secondary | ICD-10-CM | POA: Diagnosis not present

## 2019-08-01 DIAGNOSIS — Z20828 Contact with and (suspected) exposure to other viral communicable diseases: Secondary | ICD-10-CM | POA: Diagnosis not present

## 2019-08-20 DIAGNOSIS — M5416 Radiculopathy, lumbar region: Secondary | ICD-10-CM | POA: Diagnosis not present

## 2019-08-20 DIAGNOSIS — M7062 Trochanteric bursitis, left hip: Secondary | ICD-10-CM | POA: Diagnosis not present

## 2019-08-23 IMAGING — US US RENAL
1 series · 14 of 25 positions shown · non-contrast
Comparison: None.

CLINICAL DATA: Chronic kidney disease. Hypertension. Prior renal
biopsies.

EXAM:
RENAL / URINARY TRACT ULTRASOUND COMPLETE

[Series 1: us renal · 0.29mm/px · 14 of 49 slices shown]
[im 1/49]
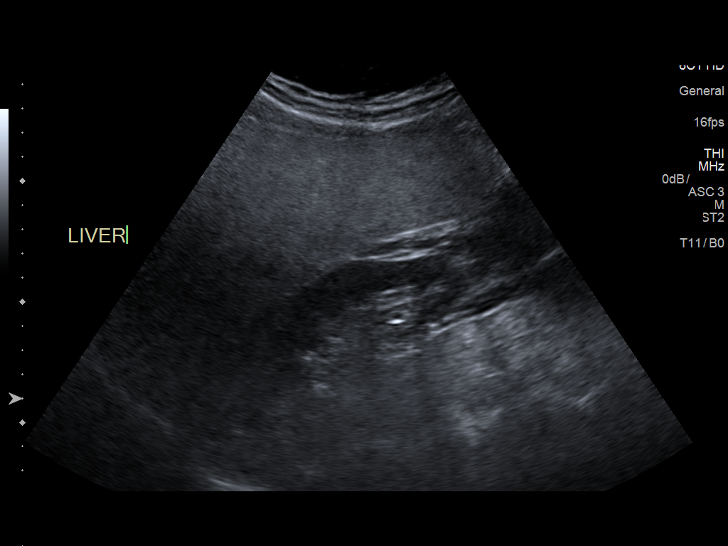
[im 5/49]
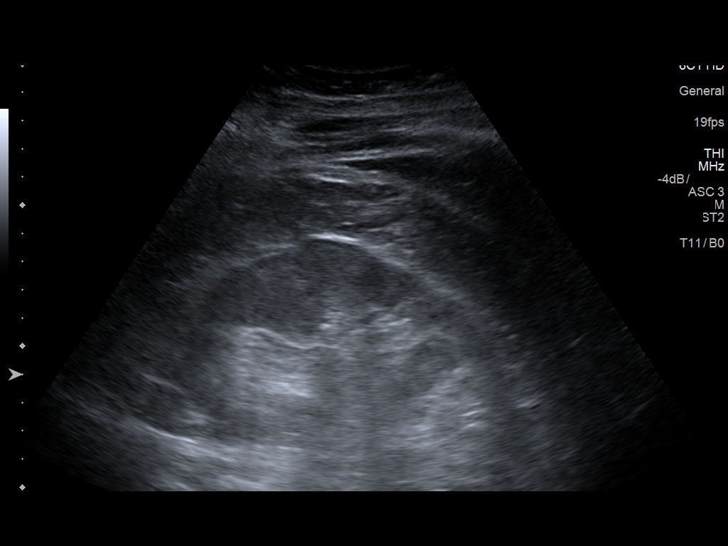
[im 9/49]
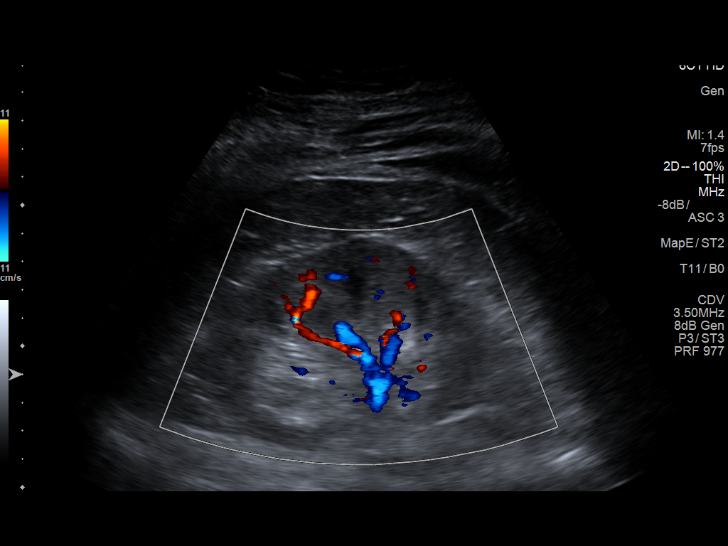
[im 13/49]
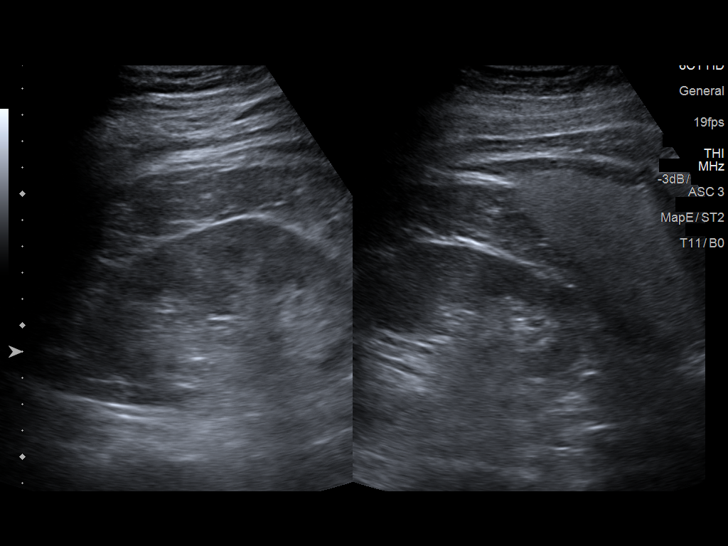
[im 17/49]
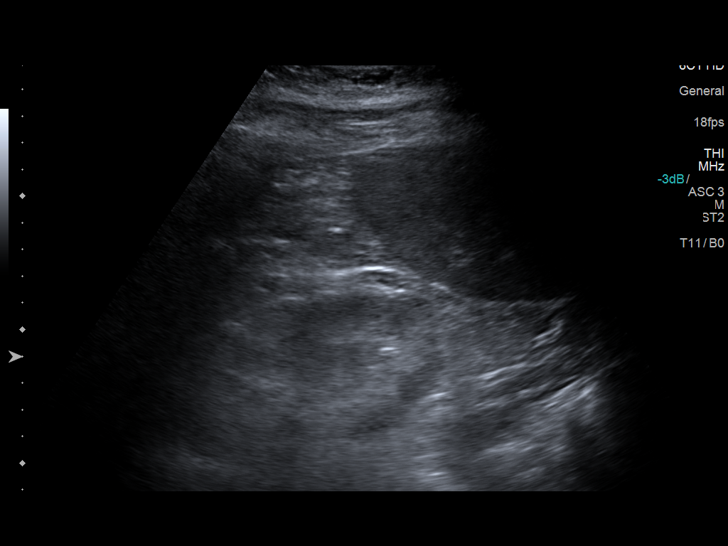
[im 19/49]
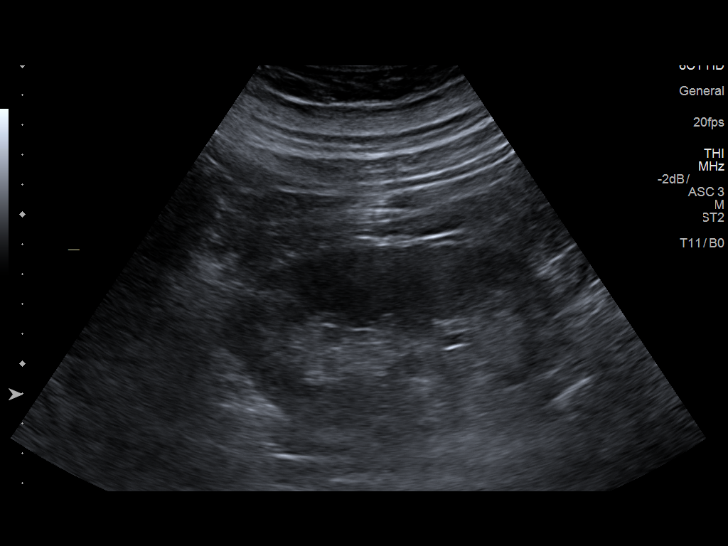
[im 23/49]
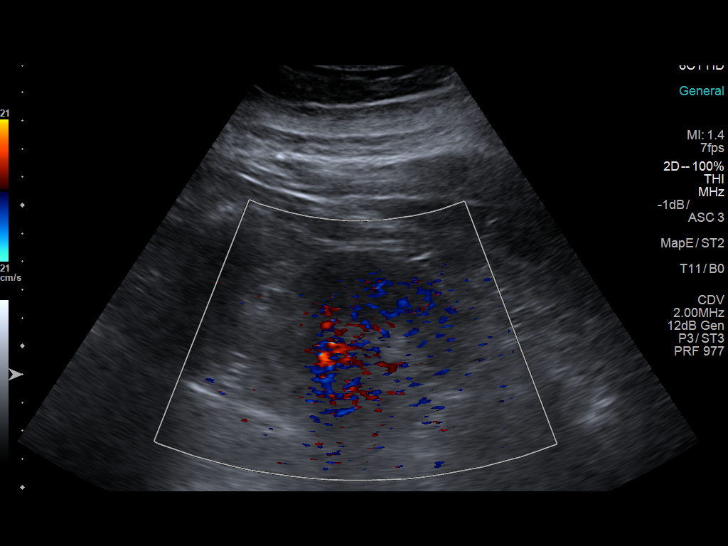
[im 27/49]
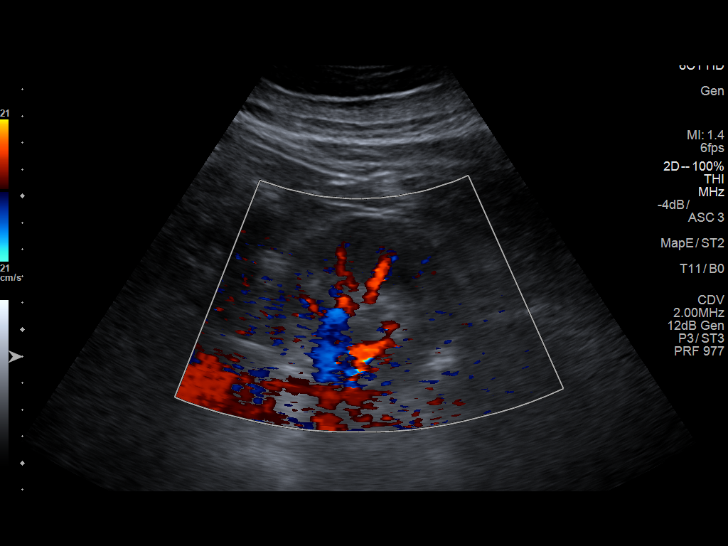
[im 31/49]
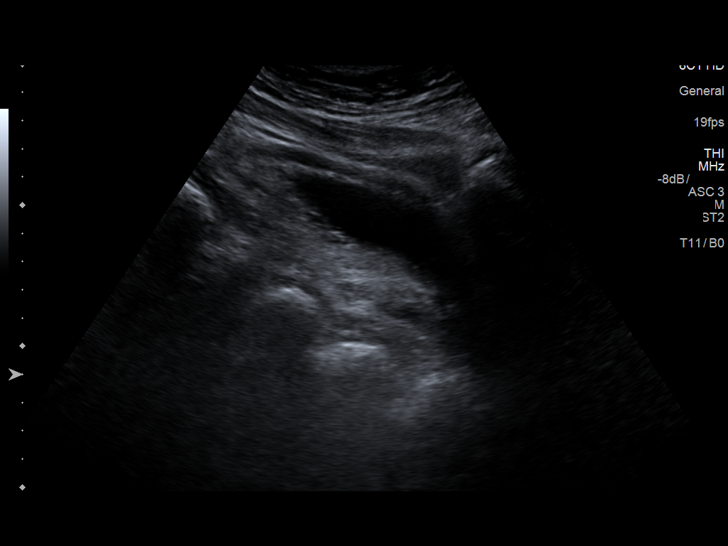
[im 33/49]
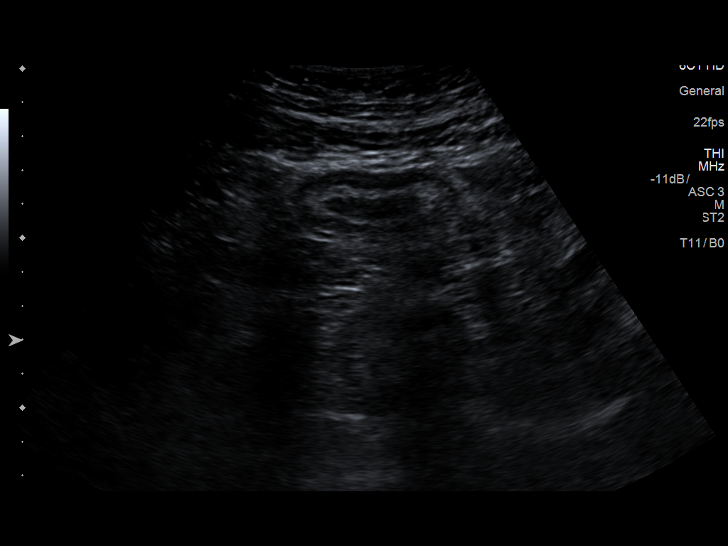
[im 37/49]
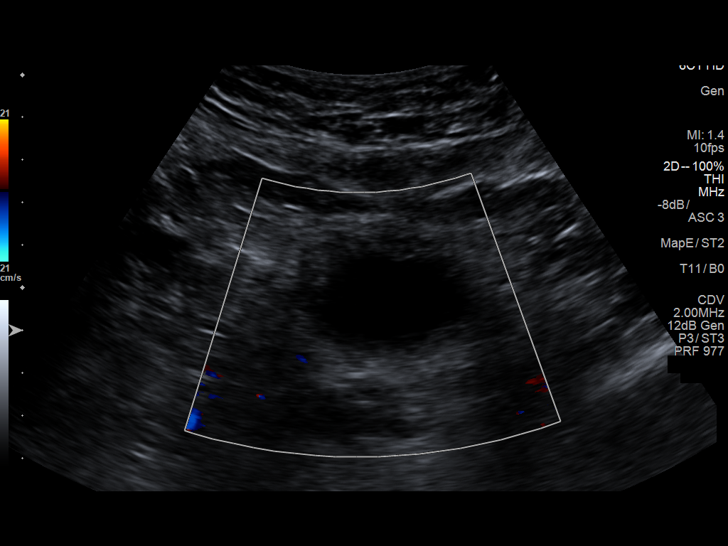
[im 41/49]
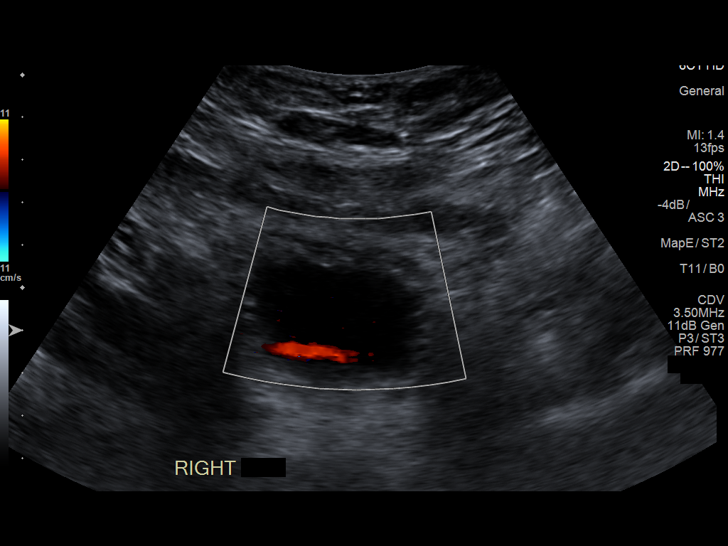
[im 45/49]
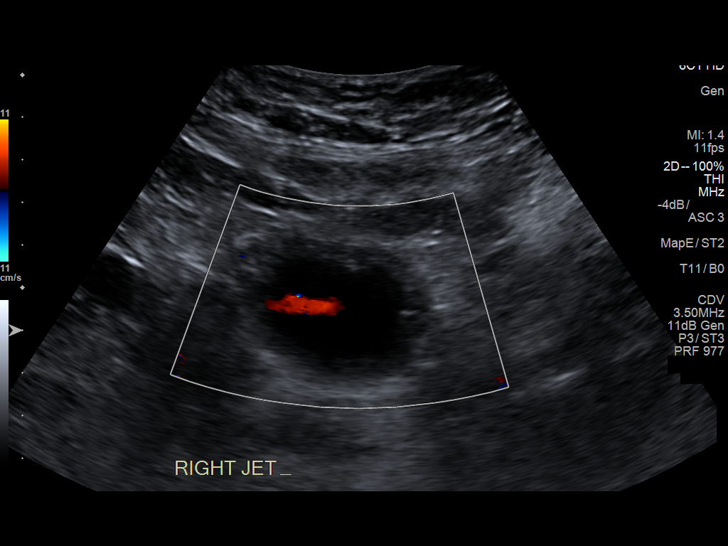
[im 49/49]
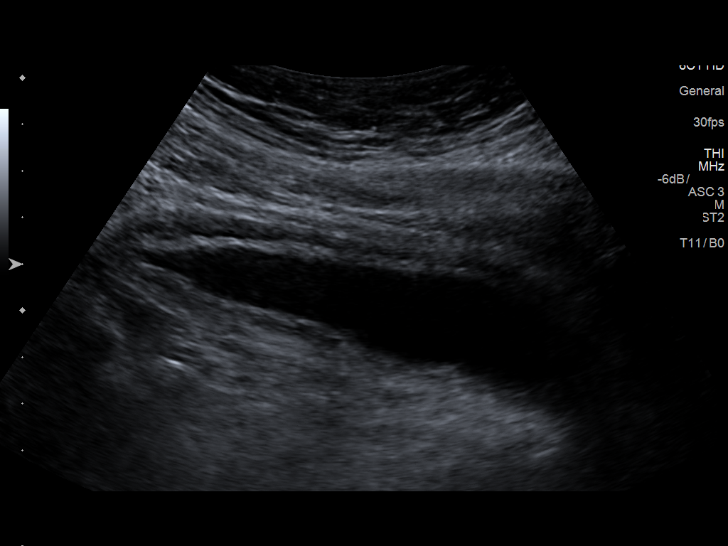

[14 of 25 positions shown; findings below may reference images not displayed]

FINDINGS: Right Kidney:

Length: 10.8 cm. Echogenicity within normal limits. No mass or
hydronephrosis visualized.

Left Kidney:

Length: 11.5 cm. Echogenicity within normal limits. No mass or
hydronephrosis visualized.

Bladder:

Decompressed.  Apparent wall thickening is favored to be secondary.

Incidental note is made of probable hepatic steatosis.
IMPRESSION: No acute finding or explanation for renal insufficiency.

Probable hepatic steatosis.

Decompressed urinary bladder.

## 2019-08-30 DIAGNOSIS — M5416 Radiculopathy, lumbar region: Secondary | ICD-10-CM | POA: Diagnosis not present

## 2019-08-30 DIAGNOSIS — M48061 Spinal stenosis, lumbar region without neurogenic claudication: Secondary | ICD-10-CM | POA: Diagnosis not present

## 2019-10-30 DIAGNOSIS — H4052X2 Glaucoma secondary to other eye disorders, left eye, moderate stage: Secondary | ICD-10-CM | POA: Diagnosis not present

## 2019-10-30 DIAGNOSIS — H4051X3 Glaucoma secondary to other eye disorders, right eye, severe stage: Secondary | ICD-10-CM | POA: Diagnosis not present

## 2019-11-29 DIAGNOSIS — M48061 Spinal stenosis, lumbar region without neurogenic claudication: Secondary | ICD-10-CM | POA: Diagnosis not present

## 2019-11-29 DIAGNOSIS — M5416 Radiculopathy, lumbar region: Secondary | ICD-10-CM | POA: Diagnosis not present

## 2020-01-30 ENCOUNTER — Encounter: Payer: Self-pay | Admitting: Family Medicine

## 2020-01-30 ENCOUNTER — Other Ambulatory Visit: Payer: Self-pay

## 2020-01-30 ENCOUNTER — Telehealth (INDEPENDENT_AMBULATORY_CARE_PROVIDER_SITE_OTHER): Payer: Medicare Other | Admitting: Family Medicine

## 2020-01-30 DIAGNOSIS — E119 Type 2 diabetes mellitus without complications: Secondary | ICD-10-CM | POA: Diagnosis not present

## 2020-01-30 NOTE — Progress Notes (Signed)
Does he need a prescription for diabetic shoes?

## 2020-01-30 NOTE — Assessment & Plan Note (Signed)
Interested in obtaining diabetic shoes.  I ask him to contact company who sent him flyer and have them send over necessary paperwork.  We may need to have him come in for a foot exam and face to face visit to satisfy requirements.   He does have [employer] benefits and alternatively they would be able to supply diabetic shoes and socks as well for him.

## 2020-01-30 NOTE — Progress Notes (Signed)
Tanner Young - 64 y.o. male MRN [id number]  Date of birth: 09/29/1955   This visit type was conducted due to national recommendations for restrictions regarding the COVID-19 Pandemic (e.g. social distancing).  This format is felt to be most appropriate for this patient at this time.  All issues noted in this document were discussed and addressed.  No physical exam was performed (except for noted visual exam findings with Video Visits).  I discussed the limitations of evaluation and management by telemedicine and the availability of in person appointments. The patient expressed understanding and agreed to proceed.  I connected with@ on 01/30/20 at 11:10 AM EDT by a video enabled telemedicine application and verified that I am speaking with the correct person using two identifiers.  Present at visit: [name], DO [name]   Patient Location: Home [address] [place] [address]   Provider location:   [facility]  Chief Complaint  Patient presents with  . Diabetes    HPI  Tanner Young is a 64 y.o. male who presents via audio/video conferencing for a telehealth visit today.  He is being seen today to request rx for diabetic shoes.  He  Received flyer in mail that he may be eligible for diabetic shoes.  He has history of spinal stenosis with some mild neuropathy of feet.  He denies any history of ulceration or foot deformity.  He is also seen at [facility] but has never seen podiatry there.    ROS:  A comprehensive ROS was completed and negative except as noted per HPI  Past Medical History:  Diagnosis Date  . Arthritis   . Chronic back pain   . Chronic kidney disease   . Cough   . Diabetes mellitus type II, controlled ([place])   . Dry skin   . Floaters in visual field   . GERD (gastroesophageal reflux disease)   . Glaucoma, both eyes   . Hay fever   . Heartburn   . Hyperlipemia   . Hypertension   . Itching   . Joint pain   . Kidney disease   . Lactose intolerance   .  Leg cramp   . Low back pain   . Multiple food allergies   . Muscle pain   . Neuropathy    rt leg from DDD  . Rash   . Sleep apnea    uses a cpap  . Stiff neck   . Swelling of lower extremity   . Trouble in sleeping   . Vision changes   . Vitamin D deficiency   . Wears glasses   . Wears partial dentures    top mid partial-flipper    Past Surgical History:  Procedure Laterality Date  . COLONOSCOPY    . DENTAL SURGERY     extractions  . EYE SURGERY    . ORIF PATELLA Right 01/24/2013   Procedure: OPEN REDUCTION INTERNAL (ORIF) FIXATION PATELLA;  Surgeon: Kerin Salen, MD;  Location: [facility];  Service: Orthopedics;  Laterality: Right;  orif right patella,patellectomy     Family History  Problem Relation Age of Onset  . Arthritis Mother   . Hearing loss Mother   . Hyperlipidemia Mother   . Hypertension Mother   . Diabetes Mother   . Cancer Father   . Hypertension Father   . Diabetes Brother   . Hyperlipidemia Brother   . Hypertension Brother     Social History   Socioeconomic History  .  Marital status: Married    Spouse name: Runner, broadcasting/film/video  . Number of children: Not on file  . Years of education: Not on file  . Highest education level: Not on file  Occupational History  . Occupation: Retired  Tobacco Use  . Smoking status: Never Smoker  . Smokeless tobacco: Never Used  Vaping Use  . Vaping Use: Never used  Substance and Sexual Activity  . Alcohol use: No  . Drug use: No  . Sexual activity: Not on file  Other Topics Concern  . Not on file  Social History Narrative  . Not on file   Social Determinants of Health   Financial Resource Strain:   . Difficulty of Paying Living Expenses:   Food Insecurity:   . Worried About Charity fundraiser in the Last Year:   . Arboriculturist in the Last Year:   Transportation Needs:   . Film/video editor (Medical):   [name] Lack of Transportation (Non-Medical):   Physical Activity:   . Days of Exercise  per Week:   . Minutes of Exercise per Session:   Stress:   . Feeling of Stress :   Social Connections:   . Frequency of Communication with Friends and Family:   . Frequency of Social Gatherings with Friends and Family:   . Attends Religious Services:   . Active Member of Clubs or Organizations:   . Attends Archivist Meetings:   [name] Marital Status:   Intimate Partner Violence:   . Fear of Current or Ex-Partner:   . Emotionally Abused:   [name] Physically Abused:   . Sexually Abused:      Current Outpatient Medications:  .  amLODipine (NORVASC) 5 MG tablet, Take 5 mg by mouth daily., Disp: , Rfl:  .  Artificial Tear Ointment (AKWA TEARS) 09-30-81 % OINT, Place into both eyes at bedtime as needed., Disp: , Rfl:  .  Aspirin Buf,CaCarb-MgCarb-MgO, 81 MG TABS, Take by mouth., Disp: , Rfl:  .  benazepril (LOTENSIN) 40 MG tablet, Take 40 mg by mouth daily., Disp: , Rfl:  .  blood glucose meter kit and supplies KIT, Dispense based on patient and insurance preference. Use daily as directed to monitor blood sugar. (FOR ICD-9 250.00, 250.01)., Disp: 1 each, Rfl: 0 .  brimonidine (ALPHAGAN) 0.15 % ophthalmic solution, Place 1 drop into both eyes 2 (two) times daily at 10 AM and 5 PM., Disp: , Rfl:  .  cetirizine (ZYRTEC) 10 MG tablet, Take 10 mg by mouth daily., Disp: , Rfl:  .  cholecalciferol (VITAMIN D) 1000 UNITS tablet, Take 1,000 Units by mouth daily., Disp: , Rfl:  .  desonide (DESOWEN) 0.05 % cream, Apply topically 2 (two) times daily., Disp: , Rfl:  .  dorzolamide-timolol (COSOPT) 22.3-6.8 MG/ML ophthalmic solution, Place 1 drop into both eyes 2 (two) times daily. , Disp: , Rfl:  .  Easy Touch Lancets 26G MISC, Use as directed daily, Disp: 100 each, Rfl: 1 .  gabapentin (NEURONTIN) 300 MG capsule, Take 300 mg by mouth at bedtime., Disp: , Rfl:  .  glucose blood (PRECISION XTRA TEST STRIPS) test strip, Use as directed daily., Disp: 100 each, Rfl: 1 .  ketoconazole (NIZORAL) 2 % cream,  Apply 1 application topically daily., Disp: , Rfl:  .  omeprazole (PRILOSEC) 20 MG capsule, Take 20 mg by mouth at bedtime., Disp: , Rfl:  .  sildenafil (VIAGRA) 100 MG tablet, Take 100 mg by mouth daily as needed for  erectile dysfunction., Disp: , Rfl:  .  simvastatin (ZOCOR) 20 MG tablet, Take 10 mg by mouth at bedtime., Disp: , Rfl:  .  terbinafine (LAMISIL) 1 % cream, Apply 1 application topically 2 (two) times daily., Disp: , Rfl:  .  triamcinolone ointment (KENALOG) 0.1 %, Apply 1 application topically 2 (two) times daily., Disp: , Rfl:  .  triamterene-hydrochlorothiazide (MAXZIDE) 75-50 MG per tablet, Take 0.5 tablets by mouth daily., Disp: , Rfl:  .  cyclobenzaprine (FLEXERIL) 10 MG tablet, Take 0.5-1 tablets (5-10 mg total) by mouth 3 (three) times daily as needed for muscle spasms. (Patient not taking: Reported on 01/30/2020), Disp: 30 tablet, Rfl: 0 .  metFORMIN (GLUCOPHAGE-XR) 500 MG 24 hr tablet, TAKE 1 TABLET(500 MG) BY MOUTH DAILY WITH BREAKFAST, Disp: 90 tablet, Rfl: 2 .  moxifloxacin (VIGAMOX) 0.5 % ophthalmic solution, 1 drop 3 (three) times daily. (Patient not taking: Reported on 01/30/2020), Disp: , Rfl:  .  PRESCRIPTION MEDICATION, Apply 1 application topically daily. Cream to feet (Patient not taking: Reported on 01/30/2020), Disp: , Rfl:  .  PRESCRIPTION MEDICATION, Apply 1 application topically daily. Cream to back (Patient not taking: Reported on 01/30/2020), Disp: , Rfl:  .  RHOPRESSA 0.02 % SOLN, INT 1 GTT IN OU Q NIGHT (Patient not taking: Reported on 01/30/2020), Disp: , Rfl: 3  EXAM:  VITALS per patient if applicable: Wt 230 lb (104.3 kg)   BMI 33.00 kg/m   GENERAL: alert, oriented, appears well and in no acute distress  HEENT: atraumatic, conjunttiva clear, no obvious abnormalities on inspection of external nose and ears  NECK: normal movements of the head and neck  LUNGS: on inspection no signs of respiratory distress, breathing rate appears normal, no obvious  gross SOB, gasping or wheezing  CV: no obvious cyanosis  MS: moves all visible extremities without noticeable abnormality  PSYCH/NEURO: pleasant and cooperative, no obvious depression or anxiety, speech and thought processing grossly intact  ASSESSMENT AND PLAN:  Discussed the following assessment and plan:  Type 2 diabetes mellitus without complication, without long-term current use of insulin ([place]) Interested in obtaining diabetic shoes.  I ask him to contact company who sent him flyer and have them send over necessary paperwork.  We may need to have him come in for a foot exam and face to face visit to satisfy requirements.   He does have [employer] benefits and alternatively they would be able to supply diabetic shoes and socks as well for him.    20 minutes spent including pre visit preparation, review of prior notes and labs, encounter with patient via video visit and same day documentation.    I discussed the assessment and treatment plan with the patient. The patient was provided an opportunity to ask questions and all were answered. The patient agreed with the plan and demonstrated an understanding of the instructions.   The patient was advised to call back or seek an in-person evaluation if the symptoms worsen or if the condition fails to improve as anticipated.    [name], DO

## 2020-04-28 ENCOUNTER — Encounter: Payer: Self-pay | Admitting: Neurology

## 2020-04-28 ENCOUNTER — Ambulatory Visit (INDEPENDENT_AMBULATORY_CARE_PROVIDER_SITE_OTHER): Payer: Medicare Other | Admitting: Neurology

## 2020-04-28 ENCOUNTER — Other Ambulatory Visit: Payer: Self-pay

## 2020-04-28 VITALS — BP 122/78 | HR 46 | Ht 70.0 in | Wt 251.0 lb

## 2020-04-28 DIAGNOSIS — Z9989 Dependence on other enabling machines and devices: Secondary | ICD-10-CM | POA: Diagnosis not present

## 2020-04-28 DIAGNOSIS — G4733 Obstructive sleep apnea (adult) (pediatric): Secondary | ICD-10-CM | POA: Diagnosis not present

## 2020-04-28 NOTE — Patient Instructions (Addendum)
You are fully compliant with your CPAP.  Your pressure settings are okay.  I would not want to lower the pressure at this time.  Please continue to work on weight loss.    Please continue using your CPAP regularly. While your insurance requires that you use CPAP at least 4 hours each night on 70% of the nights, I recommend, that you not skip any nights and use it throughout the night if you can. Getting used to CPAP and staying with the treatment long term does take time and patience and discipline. Untreated obstructive sleep apnea when it is moderate to severe can have an adverse impact on cardiovascular health and raise her risk for heart disease, arrhythmias, hypertension, congestive heart failure, stroke and diabetes. Untreated obstructive sleep apnea causes sleep disruption, nonrestorative sleep, and sleep deprivation. This can have an impact on your day to day functioning and cause daytime sleepiness and impairment of cognitive function, memory loss, mood disturbance, and problems focussing. Using CPAP regularly can improve these symptoms. Keep up the good work! We can see you in 1 year, you can see one of our nurse practitioners as you are stable. We can do a virtual visit at the time, if you prefer.

## 2020-04-28 NOTE — Progress Notes (Signed)
Subjective:    [name] ID: Tanner [name] is a 64 y.o. male.  HPI     Interim history:   Tanner [name] is a 64 year old right-handed gentleman with an underlying medical history of hypertension, reflux disease, chronic low back pain, arthritis, glaucoma, and obesity, who presents for follow-up consultation of his obstructive sleep apnea, on CPAP therapy. Tanner [name] is unaccompanied today, his wife is on speaker phone. I last saw him on 04/25/2019, at which time he was fully compliant with CPAP and doing well.  He was working on weight loss.  Since he had lost some weight, I suggested we reduce his treatment pressure from 16 cm to 14 cm at Tanner time. He was advised to follow-up routinely in 1 year.  Today, 04/28/2020: I reviewed his CPAP compliance data from 03/25/2020 through 04/23/2020, which is a total of 30 days, during which time he used his machine every night with percent use days greater than 4 hours at 100%, indicating superb compliance, average usage of 6 hours and 7 minutes, residual AHI borderline at 4.5/h, pressure at 14 cm with EPR of 3, 95th percentile of leak at 13.9 L/min.  He reports generally doing well.  He has had some weight fluctuation, some weight gain compared to last year.  He is compliant with treatment but is wondering if he should get reevaluated for Tanner need of CPAP therapy.  His wife is on Tanner speaker phone.  He is up-to-date with his supplies, he uses a full facemask, has changed his supplies on a regular basis.  He has had no new changes in his medication list or medical history.  Had a checkup with his primary care physician for his yearly physical.    Tanner [name]'s allergies, current medications, family history, past medical history, past social history, past surgical history and problem list were reviewed and updated as appropriate.    Previously:      I saw him on 10/23/2018, at which time he was compliant with treatment.  He had lost quite a bit of weight.  He was  advised to continue with treatment and we considered repeating his sleep study at some point in Tanner near future to reevaluate his sleep apnea and Tanner need for ongoing CPAP therapy.      I reviewed his CPAP compliance data from 03/26/2019 through 04/24/2019 which is a total of 30 days, during which time he used his machine every night with percent use days greater than 4 hours 100%, indicating superb compliance with an average usage of 4 hours and 51 minutes, residual AHI 2.3/h, leak on Tanner high side with a 95th percentile at 19.5 L/min on a pressure of 16 cm with EPR of 3.       I saw him on 11/21/2017, at which time we talked about his sleep study results including his split-night sleep study from 09/15/2017 as well as his separate CPAP titration study on 10/18/2017. He was on CPAP. He was compliant with it. He reported feeling better. He was less sleepy during Tanner day, he was using Norco as needed.    He saw Cecille Rubin, nurse practitioner in Tanner interim on 05/23/2018 at which time he was compliant with treatment.    I reviewed his CPAP compliance data from 09/20/2018 through 10/19/2018 which is a total of 30 days, during which time he used his CPAP 26 days with percent used days greater than 4 hours at 83%, indicating very good compliance with an average usage of  5 hours and 12 minutes, residual AHI at goal at 2.2 per hour, leak acceptable with Tanner 95th percentile at 9.4 L/m on a pressure of 16 cm with EPR of 3.    I first met him on 08/02/2017 at Tanner request of his dentist, at which time he reported a prior diagnosis of obstructive sleep apnea but he does not using CPAP. He was invited for sleep study testing. He had a split-night sleep study, followed by a full night titration study. I went over his test results with him in detail today. Split-night sleep study from 09/15/2017 showed a baseline sleep efficiency of 92%, sleep latency was 4 minutes, REM sleep was absent. He had a elevated AHI of 70  per hour, average oxygen saturation of 96%, nadir was 73%. He was then fitted with a medium fullface mask. CPAP was started at 5 cm and advanced to 18 cm. He did not achieve any sleep on Tanner final pressure, on a treatment pressure of 16 cm his AHI was highly elevated. Since he did not have a fully successful titration portion of Tanner study, he was advised to return for a full night CPAP titration test separately. He had this on 10/18/2017, sleep efficiency was 90.4%, sleep latency 7.5 minutes and REM latency was 46.5 minutes. CPAP was initiated at 8 cm and titrated to 16 cm. On Tanner final titration pressure his AHI was 0 per hour, supine REM sleep was achieved and with a nadir was 90%. He had no slow-wave sleep during Tanner study but REM sleep was 25.4%. He had no significant PLMS. Based on his test results he was advised to start home CPAP therapy at a pressure of 16 cm. We cautioned him regarding ongoing use of narcotic pain medication and Tanner importance of losing weight to reduce Tanner severity of his sleep-disordered breathing.    I reviewed his CPAP compliance data from 10/19/2017 to 11/17/2017 which is a total of 30 days, during which time he used his CPAP 30 days with percent used days greater than 4 hours at 100%, indicating superb compliance with an average usage of 7 hours and 10 minutes, residual AHI at goal at 3.4 per hour, leak acceptable with Tanner 95th percentile at 11.7 L/m on a pressure of 16 cm with EPR of 3.    08/02/2017: (He) was previously diagnosed with obstructive sleep apnea. Prior sleep study results are not available for my review today. I reviewed your office notes, which you kindly included. He has been on CPAP therapy, but has not been using his machine very often. A CPAP download was reviewed today from 06/28/2016 through 06/22/2017 which is a total of one year during which time he used his CPAP 4 days, indicating noncompliance, pressure set at 16 cm. Leak was very high with Tanner 95th  percentile at 90.9 L/m. Residual AHI at 4.5 per hour. His Epworth sleepiness score is 15 out of 24 today, fatigue score is 44 out of 63. He is married and lives with his wife. They have 5 children. He is a nonsmoker and drinks alcohol in Tanner form of wine occasionally. He drinks caffeine in Tanner form of tea. He has no telltale hx of RLS symptoms. No FHx known of OSA to him. He does not wake up rested and per wife is very restless, has breathing pauses and very loud snoring, which bothers her. He has no night to night nocturia. He does not typically have morning headaches. He has been having allergies and  congestion and difficulty breathing through his nose, he has had drainage. He has most of his care through [facility] [place] and has seen an allergy specialist as well. As far as his CPAP, he eventually gave up on it because it was difficult for him to use it, Tanner mask was leaking. He believes that he had sleep study testing nearly 10 years ago. His current machine is at least 66 years old. He has used a wide Mirage Fx nasal mask.  His Past Medical History Is Significant For: Past Medical History:  Diagnosis Date  . Arthritis   . Chronic back pain   . Chronic kidney disease   . Cough   . Diabetes mellitus type II, controlled ([place])   . Dry skin   . Floaters in visual field   . GERD (gastroesophageal reflux disease)   . Glaucoma, both eyes   . Hay fever   . Heartburn   . Hyperlipemia   . Hypertension   . Itching   . Joint pain   . Kidney disease   . Lactose intolerance   . Leg cramp   . Low back pain   . Multiple food allergies   . Muscle pain   . Neuropathy    rt leg from DDD  . Rash   . Sleep apnea    uses a cpap  . Stiff neck   . Swelling of lower extremity   . Trouble in sleeping   . Vision changes   . Vitamin D deficiency   . Wears glasses   . Wears partial dentures    top mid partial-flipper    His Past Surgical History Is Significant For: Past Surgical History:  Procedure  Laterality Date  . COLONOSCOPY    . DENTAL SURGERY     extractions  . EYE SURGERY    . ORIF PATELLA Right 01/24/2013   Procedure: OPEN REDUCTION INTERNAL (ORIF) FIXATION PATELLA;  Surgeon: Kerin Salen, MD;  Location: [facility];  Service: Orthopedics;  Laterality: Right;  orif right patella,patellectomy     His Family History Is Significant For: Family History  Problem Relation Age of Onset  . Arthritis Mother   . Hearing loss Mother   . Hyperlipidemia Mother   . Hypertension Mother   . Diabetes Mother   . Cancer Father   . Hypertension Father   . Diabetes Brother   . Hyperlipidemia Brother   . Hypertension Brother     His Social History Is Significant For: Social History   Socioeconomic History  . Marital status: Married    Spouse name: Runner, broadcasting/film/video  . Number of children: Not on file  . Years of education: Not on file  . Highest education level: Not on file  Occupational History  . Occupation: Retired  Tobacco Use  . Smoking status: Never Smoker  . Smokeless tobacco: Never Used  Vaping Use  . Vaping Use: Never used  Substance and Sexual Activity  . Alcohol use: No  . Drug use: No  . Sexual activity: Not on file  Other Topics Concern  . Not on file  Social History Narrative  . Not on file   Social Determinants of Health   Financial Resource Strain:   . Difficulty of Paying Living Expenses: Not on file  Food Insecurity:   . Worried About Charity fundraiser in Tanner Last Year: Not on file  . Ran Out of Food in Tanner Last Year: Not on file  Transportation Needs:   .  Lack of Transportation (Medical): Not on file  . Lack of Transportation (Non-Medical): Not on file  Physical Activity:   . Days of Exercise per Week: Not on file  . Minutes of Exercise per Session: Not on file  Stress:   . Feeling of Stress : Not on file  Social Connections:   . Frequency of Communication with Friends and Family: Not on file  . Frequency of Social Gatherings with  Friends and Family: Not on file  . Attends Religious Services: Not on file  . Active Member of Clubs or Organizations: Not on file  . Attends Archivist Meetings: Not on file  . Marital Status: Not on file    His Allergies Are:  Allergies  Allergen Reactions  . Shellfish Allergy Itching and Nausea And Vomiting    Shrimp   . Lisinopril Cough  :   His Current Medications Are:  Outpatient Encounter Medications as of 04/28/2020  Medication Sig  . amLODipine (NORVASC) 5 MG tablet Take 5 mg by mouth daily.  . Aspirin Buf,CaCarb-MgCarb-MgO, 81 MG TABS Take by mouth.  . benazepril (LOTENSIN) 40 MG tablet Take 40 mg by mouth daily.  . blood glucose meter kit and supplies KIT Dispense based on [name] and insurance preference. Use daily as directed to monitor blood sugar. (FOR ICD-9 250.00, 250.01).  . brimonidine (ALPHAGAN) 0.15 % ophthalmic solution Place 1 drop into both eyes 2 (two) times daily at 10 AM and 5 PM.  . cetirizine (ZYRTEC) 10 MG tablet Take 10 mg by mouth daily.  . cholecalciferol (VITAMIN D) 1000 UNITS tablet Take 1,000 Units by mouth daily.  . cyclobenzaprine (FLEXERIL) 10 MG tablet Take 0.5-1 tablets (5-10 mg total) by mouth 3 (three) times daily as needed for muscle spasms.  [name] desonide (DESOWEN) 0.05 % cream Apply topically 2 (two) times daily.  . dorzolamide-timolol (COSOPT) 22.3-6.8 MG/ML ophthalmic solution Place 1 drop into both eyes 2 (two) times daily.   . Easy Touch Lancets 26G MISC Use as directed daily  . gabapentin (NEURONTIN) 300 MG capsule Take 300 mg by mouth at bedtime.  [name] glucose blood (PRECISION XTRA TEST STRIPS) test strip Use as directed daily.  [name] ketoconazole (NIZORAL) 2 % cream Apply 1 application topically daily.  . metFORMIN (GLUCOPHAGE-XR) 500 MG 24 hr tablet TAKE 1 TABLET(500 MG) BY MOUTH DAILY WITH BREAKFAST  . moxifloxacin (VIGAMOX) 0.5 % ophthalmic solution 1 drop 3 (three) times daily.   [name] omeprazole (PRILOSEC) 20 MG capsule Take 20  mg by mouth at bedtime.  [name] PRESCRIPTION MEDICATION Apply 1 application topically daily. Cream to feet   . PRESCRIPTION MEDICATION Apply 1 application topically daily. Cream to back   . RHOPRESSA 0.02 % SOLN INT 1 GTT IN OU Q NIGHT  . sildenafil (VIAGRA) 100 MG tablet Take 100 mg by mouth daily as needed for erectile dysfunction.  . simvastatin (ZOCOR) 20 MG tablet Take 10 mg by mouth at bedtime.  . terbinafine (LAMISIL) 1 % cream Apply 1 application topically 2 (two) times daily.  [name] triamcinolone ointment (KENALOG) 0.1 % Apply 1 application topically 2 (two) times daily.  [name] triamterene-hydrochlorothiazide (MAXZIDE) 75-50 MG per tablet Take 0.5 tablets by mouth daily.  . [DISCONTINUED] Artificial Tear Ointment (AKWA TEARS) 09-30-81 % OINT Place into both eyes at bedtime as needed. ([name] not taking: Reported on 04/28/2020)   No facility-administered encounter medications on file as of 04/28/2020.  :  Review of Systems:  Out of a complete 14 point review  of systems, all are reviewed and negative with Tanner exception of these symptoms as listed below: Review of Systems  Neurological:       Here for 1 year f/u on cpap.  Pt reports he has been doing well over Tanner last year. He does have a few a questions about some of Tanner data he receives on his nightly report ( what is a normal # of events). He also would like ask about if he would ever be eligible to d/c cpap.    Objective:  Neurological Exam  Physical Exam Physical Examination:   Vitals:   04/28/20 0838  BP: 122/78  Pulse: (!) 46  SpO2: 98%    General Examination: Tanner [name] is a very pleasant 64 y.o. male in no acute distress. He appears well-developed and well-nourished and well groomed.   HEENT:Normocephalic, atraumatic, pupils are equal, round and reactive to light. He wears corrective eyeglasses.  Extraocular tracking is well preserved.  Hearing is grossly intact.  Face is symmetric with normal facial animation and normal  facial sensation. Speech is clear with no dysarthria noted. There is no hypophonia. There is no lip, neck/head, jaw or voice tremor. Neck is supple with full range of passive and active motion. There are no carotid bruits on auscultation. Oropharynx exam reveals:mild mouth dryness, adequatedental hygiene and moderateairway crowding. Mallampati is classII. Tongue protrudes centrally and palate elevates symmetrically.  Chest:Clear to auscultation without wheezing, rhonchi or crackles noted.  Heart:S1+S2+0, regular and normal without murmurs, rubs or gallops noted, but bradycardic.   Abdomen:Soft, non-tender and non-distended with normal bowel sounds appreciated on auscultation.  Extremities:There isnoobvious change. No pitting edema.  Skin: Warm and dry without trophic changes noted.  Musculoskeletal: exam reveals no obvious joint deformities, tenderness or joint swelling or erythema, with Tanner exception of hardware in place right knee, no obvious change.  Neurologically:  Mental status: Tanner [name] is awake, alert and oriented in all 4 spheres.Hisimmediate and remote memory, attention, language skills and fund of knowledge are appropriate. There is no evidence of aphasia, agnosia, apraxia or anomia. Speech is clear with normal prosody and enunciation. Thought process is linear. Mood is normaland affect is normal.  Cranial nerves II - XII are as described above under HEENT exam.  Motor exam: Normal bulk, strength and tone is noted. There is no tremor. Fine motor skills and coordination: grossly intact.  Cerebellar testing: No dysmetria or intention tremor. There is no truncal or gait ataxia.  Sensory exam: intact to light touch in Tanner upper and lower extremities.  Gait, station and balance:Hestands easily. No veering to one side is noted. No leaning to one side is noted. Posture is age-appropriate and stance is narrow based. Gait showsnormalstride length and normalpace.  No problems turning are noted. Tandem walk is good.  Romberg negative.  Assessmentand Plan:  In summary,Tanner R Watsonis a very pleasant 19 year oldmalewith an underlying medical history of hypertension, reflux disease, chronic low back pain, arthritis, glaucoma, and obesity, whopresents forfollow-up consultation of his obstructive sleep apnea. He had difficulty with CPAPin Tanner past. He had a split-night sleep study in January 2019, which confirmed severe obstructive sleep apnea and a subsequent full night titration study in March 2019. Hecontinues to be compliant with his CPAP. He has lost weight since his sleep study, but gained some weight back compared to last year.  We reduced his treatment pressure from 16 cm to 14 cm in September 2020.  His numbers are good, he is fully compliant with  treatment, apnea score below 5/h on average.  Leak is acceptable.  He uses a full facemask.  He is advised that while we can reevaluate his sleep disordered breathing with a home sleep test, I would favor to continue treatment for now.  Given that he has had borderline sleep apnea scores on a pressure of 14 cm does indicate ongoing need for sleep apnea treatment.  He is encouraged to continue to be fully compliant with CPAP and continue to work on weight loss.  We can certainly reevaluate his sleep apnea perhaps next year.  He is agreeable with this approach and is agreeable to continuing with Tanner CPAP at Tanner current settings.  I placed an order for his supplies through his DME company.  We went over his compliance data together.  He is advised to follow-up routinely to see one of our nurse practitioners in 1 year, we can also offer her a virtual visit at Tanner time.  I answered all his questions today and he was in agreement, wife did not have any additional questions and voiced agreement as well over Tanner phone.  I spent 20 minutes in total face-to-face time and in reviewing records during pre-charting,  more than 50% of which was spent in counseling and coordination of care, reviewing test results, reviewing medications and treatment regimen and/or in discussing or reviewing Tanner diagnosis of OSA, Tanner prognosis and treatment options. Pertinent laboratory and imaging test results that were available during this visit with Tanner [name] were reviewed by me and considered in my medical decision making (see chart for details).

## 2020-06-23 ENCOUNTER — Encounter: Payer: Self-pay | Admitting: Family Medicine

## 2020-06-23 ENCOUNTER — Ambulatory Visit (INDEPENDENT_AMBULATORY_CARE_PROVIDER_SITE_OTHER): Payer: Medicare Other | Admitting: Family Medicine

## 2020-06-23 VITALS — BP 146/72 | HR 49 | Wt 254.6 lb

## 2020-06-23 DIAGNOSIS — R972 Elevated prostate specific antigen [PSA]: Secondary | ICD-10-CM | POA: Diagnosis not present

## 2020-06-23 DIAGNOSIS — E785 Hyperlipidemia, unspecified: Secondary | ICD-10-CM | POA: Diagnosis not present

## 2020-06-23 DIAGNOSIS — E119 Type 2 diabetes mellitus without complications: Secondary | ICD-10-CM

## 2020-06-23 NOTE — Progress Notes (Signed)
Tanner Young - 64 y.o. male MRN [id number]  Date of birth: 11-12-1955  Subjective Chief Complaint  Patient presents with  . Labs Only    HPI Tanner Young is a 64 y.o. male here today for follow up of elevated PSA.  He recently had labs completed through the [place] and PSA elevated to 5.46.  His last PSA that we checked in 2019 was 1.74.  He denies any symptoms including nocturia, hesitancy, urgency, dysuria. Hematuria.  He reports the rest of his labs were normal including an A1c of 5.8%.     ROS:  A comprehensive ROS was completed and negative except as noted per HPI  Allergies  Allergen Reactions  . Shellfish Allergy Itching and Nausea And Vomiting    Shrimp   . Lisinopril Cough    Past Medical History:  Diagnosis Date  . Arthritis   . Chronic back pain   . Chronic kidney disease   . Cough   . Diabetes mellitus type II, controlled ([place])   . Dry skin   . Floaters in visual field   . GERD (gastroesophageal reflux disease)   . Glaucoma, both eyes   . Hay fever   . Heartburn   . Hyperlipemia   . Hypertension   . Itching   . Joint pain   . Kidney disease   . Lactose intolerance   . Leg cramp   . Low back pain   . Multiple food allergies   . Muscle pain   . Neuropathy    rt leg from DDD  . Rash   . Sleep apnea    uses a cpap  . Stiff neck   . Swelling of lower extremity   . Trouble in sleeping   . Vision changes   . Vitamin D deficiency   . Wears glasses   . Wears partial dentures    top mid partial-flipper    Past Surgical History:  Procedure Laterality Date  . COLONOSCOPY    . DENTAL SURGERY     extractions  . EYE SURGERY    . ORIF PATELLA Right 01/24/2013   Procedure: OPEN REDUCTION INTERNAL (ORIF) FIXATION PATELLA;  Surgeon: Kerin Salen, MD;  Location: [facility];  Service: Orthopedics;  Laterality: Right;  orif right patella,patellectomy     Social History   Socioeconomic History  . Marital status: Married    Spouse  name: Runner, broadcasting/film/video  . Number of children: Not on file  . Years of education: Not on file  . Highest education level: Not on file  Occupational History  . Occupation: Retired  Tobacco Use  . Smoking status: Never Smoker  . Smokeless tobacco: Never Used  Vaping Use  . Vaping Use: Never used  Substance and Sexual Activity  . Alcohol use: No  . Drug use: No  . Sexual activity: Not on file  Other Topics Concern  . Not on file  Social History Narrative  . Not on file   Social Determinants of Health   Financial Resource Strain:   . Difficulty of Paying Living Expenses: Not on file  Food Insecurity:   . Worried About Charity fundraiser in the Last Year: Not on file  . Ran Out of Food in the Last Year: Not on file  Transportation Needs:   . Lack of Transportation (Medical): Not on file  . Lack of Transportation (Non-Medical): Not on file  Physical Activity:   . Days of Exercise per  Week: Not on file  . Minutes of Exercise per Session: Not on file  Stress:   . Feeling of Stress : Not on file  Social Connections:   . Frequency of Communication with Friends and Family: Not on file  . Frequency of Social Gatherings with Friends and Family: Not on file  . Attends Religious Services: Not on file  . Active Member of Clubs or Organizations: Not on file  . Attends Archivist Meetings: Not on file  . Marital Status: Not on file    Family History  Problem Relation Age of Onset  . Arthritis Mother   . Hearing loss Mother   . Hyperlipidemia Mother   . Hypertension Mother   . Diabetes Mother   . Cancer Father   . Hypertension Father   . Diabetes Brother   . Hyperlipidemia Brother   . Hypertension Brother     Health Maintenance  Topic Date Due  . Hepatitis C Screening  Never done  . HIV Screening  Never done  . TETANUS/TDAP  Never done  . PNA vac Low Risk Adult (1 of 2 - PCV13) Never done  . HEMOGLOBIN A1C  12/07/2019  . INFLUENZA VACCINE  03/16/2020  . OPHTHALMOLOGY  EXAM  05/14/2020  . FOOT EXAM  06/07/2020  . COLONOSCOPY  09/21/2028  . COVID-19 Vaccine  Completed     ----------------------------------------------------------------------------------------------------------------------------------------------------------------------------------------------------------------- Physical Exam BP (!) 146/72 (BP Location: Left Arm, Patient Position: Sitting, Cuff Size: Large)   Pulse (!) 49   Wt 254 lb 9.6 oz (115.5 kg)   SpO2 98%   BMI 36.53 kg/m   Physical Exam Constitutional:      Appearance: Normal appearance.  Eyes:     General: No scleral icterus. Cardiovascular:     Rate and Rhythm: Normal rate and regular rhythm.  Pulmonary:     Effort: Pulmonary effort is normal.  Neurological:     General: No focal deficit present.     Mental Status: He is alert.  Psychiatric:        Mood and Affect: Mood normal.        Behavior: Behavior normal.     ------------------------------------------------------------------------------------------------------------------------------------------------------------------------------------------------------------------- Assessment and Plan  Type 2 diabetes mellitus without complication, without long-term current use of insulin ([place]) Reports recent a1c remains stable at 5.8%.  I asked that he sign a release to have labs sent over.   Elevated PSA Denies symptoms related to elevated PSA.  Recheck levels to confirm.  Discussed referral to urology if levels remain elevated.    No orders of the defined types were placed in this encounter.   No follow-ups on file.    This visit occurred during the SARS-CoV-2 public health emergency.  Safety protocols were in place, including screening questions prior to the visit, additional usage of staff PPE, and extensive cleaning of exam room while observing appropriate contact time as indicated for disinfecting solutions.

## 2020-06-23 NOTE — Assessment & Plan Note (Signed)
Denies symptoms related to elevated PSA.  Recheck levels to confirm.  Discussed referral to urology if levels remain elevated.

## 2020-06-23 NOTE — Patient Instructions (Signed)
Prostate-Specific Antigen Test Why am I having this test? The prostate-specific antigen (PSA) test is a screening test for prostate cancer. It can identify early signs of prostate cancer, which may allow for more effective treatment. Your health care provider may recommend that you have a PSA test starting at age 66 or that you have one earlier or later, depending on your risk factors for prostate cancer. You may also have a PSA test:  To monitor treatment of prostate cancer.  To check whether prostate cancer has returned after treatment.  If you have signs of other conditions that can affect PSA levels, such as: ? An enlarged prostate that is not caused by cancer (benign prostatic hyperplasia, BPH). This condition is very common in older men. ? A prostate infection. What is being tested? This test measures the amount of PSA in your blood. PSA is a protein that is made in the prostate. The prostate naturally produces more PSA as you age, but very high levels may be a sign of a medical condition. What kind of sample is taken?  A blood sample is required for this test. It is usually collected by inserting a needle into a blood vessel or by sticking a finger with a small needle. Blood for this test should be drawn before having an exam of the prostate. How do I prepare for this test? Do not ejaculate starting 24 hours before your test, or as long as told by your health care provider. Tell a health care provider about:  Any allergies you have.  All medicines you are taking, including vitamins, herbs, eye drops, creams, and over-the-counter medicines. This also includes: ? Medicines to assist with hair growth, such as finasteride. ? Any recent exposure to a medicine called diethylstilbestrol.  Any blood disorders you have.  Any recent procedures you have had, especially any procedures involving the prostate or rectum.  Any medical conditions you have.  Any recent urinary tract infections  (UTIs) you have had. How are the results reported? Your test results will be reported as a value that indicates how much PSA is in your blood. This will be given as nanograms of PSA per milliliter of blood (ng/mL). Your health care provider will compare your results to normal ranges that were established after testing a large group of people (reference ranges). Reference ranges may vary among labs and hospitals. PSA levels vary from person to person and generally increase with age. Because of this variation, there is no single PSA value that is considered normal for everyone. Instead, PSA reference ranges are used to describe whether your PSA levels are considered low or high (elevated). Common reference ranges are:  Low: 0-2.5 ng/mL.  Slightly to moderately elevated: 2.6-10.0 ng/mL.  Moderately elevated: 10.0-19.9 ng/mL.  Significantly elevated: 20 ng/mL or greater. Sometimes, the test results may report that a condition is present when it is not present (false-positive result). What do the results mean? A test result that is higher than 4 ng/mL may mean that you are at an increased risk for prostate cancer. However, a PSA test by itself is not enough to diagnose prostate cancer. High PSA levels may also be caused by the natural aging process, prostate infection, or BPH. PSA screening cannot tell you if your PSA is high due to cancer or a different cause. A prostate biopsy is the only way to diagnose prostate cancer. A risk of having the PSA test is diagnosing and treating prostate cancer that would never have caused any   symptoms or problems (overdiagnosis and overtreatment). Talk with your health care provider about what your results mean. Questions to ask your health care provider Ask your health care provider, or the department that is doing the test:  When will my results be ready?  How will I get my results?  What are my treatment options?  What other tests do I need?  What are my  next steps? Summary  The prostate-specific antigen (PSA) test is a screening test for prostate cancer.  Your health care provider may recommend that you have a PSA test starting at age 66 or that you have one earlier or later, depending on your risk factors for prostate cancer.  A test result that is higher than 4 ng/mL may mean that you are at an increased risk for prostate cancer. However, elevated levels can be caused by a number of conditions other than prostate cancer.  Talk with your health care provider about what your results mean. This information is not intended to replace advice given to you by your health care provider. Make sure you discuss any questions you have with your health care provider. Document Revised: 07/15/2017 Document Reviewed: 05/09/2017 Elsevier Patient Education  2020 Elsevier Inc.  

## 2020-06-23 NOTE — Assessment & Plan Note (Signed)
Reports recent a1c remains stable at 5.8%.  I asked that he sign a release to have labs sent over.

## 2020-06-24 ENCOUNTER — Other Ambulatory Visit: Payer: Self-pay | Admitting: Family Medicine

## 2020-06-24 DIAGNOSIS — R972 Elevated prostate specific antigen [PSA]: Secondary | ICD-10-CM

## 2020-06-24 LAB — PSA: PSA: 4.91 ng/mL — ABNORMAL HIGH (ref <=4.0)

## 2020-10-16 ENCOUNTER — Other Ambulatory Visit: Payer: Self-pay | Admitting: Urology

## 2020-10-16 ENCOUNTER — Other Ambulatory Visit (HOSPITAL_COMMUNITY): Payer: Self-pay | Admitting: Urology

## 2020-10-16 DIAGNOSIS — C61 Malignant neoplasm of prostate: Secondary | ICD-10-CM

## 2020-10-21 ENCOUNTER — Ambulatory Visit: Payer: Medicare Other | Admitting: Radiation Oncology

## 2020-10-24 ENCOUNTER — Ambulatory Visit (HOSPITAL_COMMUNITY): Payer: Medicare Other

## 2020-10-24 ENCOUNTER — Other Ambulatory Visit (HOSPITAL_COMMUNITY): Payer: Medicare Other

## 2020-10-27 ENCOUNTER — Encounter: Payer: Self-pay | Admitting: Radiation Oncology

## 2020-10-27 NOTE — Progress Notes (Addendum)
GU Location of Tumor / Histology: prostatic adenocarcinoma  If Prostate Cancer, Gleason Score is (4 + 4) and PSA is (5.46) Prostate volume: 38 g  Tanner Young was referred to Dr. Abner Greenspan for further evaluation of an elevated PSA. The most recent PSA was elevated at 4.91 on 06/23/2020. He did have prior PSA done at the [facility] hospital which was 5.46 also in 06/2020. Prior PSA values available for review are 1.74 on 01/20/2018. He denies any family history of prostate cancer.  Biopsies of prostate (if applicable) revealed:   Past/Anticipated interventions by urology, if any: prostate biopsy, ordered CT abd/pelvis and bone scan, referral to radiation oncology to discuss options  Past/Anticipated interventions by medical oncology, if any: no  Weight changes, if any: no  Bowel/Bladder complaints, if any: IPSS 0. SHIM 2.  Denies urinary leakage or incontinence. Denies dysuria or hematuria. Denies any bowel complaints.  Nausea/Vomiting, if any: no  Pain issues, if any:  Denies new pain. Reports chronic right knee pain related to effects of surgery. Reports chronic low back pain managed with injections.  SAFETY ISSUES:  Prior radiation? denies  Pacemaker/ICD? denies  Possible current pregnancy? no, male patient  Is the patient on methotrexate? no  Current Complaints / other details:  65 year old male. Married. Resides in [place].

## 2020-10-28 ENCOUNTER — Telehealth: Payer: Self-pay

## 2020-10-28 ENCOUNTER — Other Ambulatory Visit: Payer: Self-pay

## 2020-10-28 ENCOUNTER — Telehealth: Payer: Self-pay | Admitting: Radiation Oncology

## 2020-10-28 ENCOUNTER — Encounter: Payer: Self-pay | Admitting: Radiation Oncology

## 2020-10-28 ENCOUNTER — Encounter: Payer: Self-pay | Admitting: Medical Oncology

## 2020-10-28 ENCOUNTER — Ambulatory Visit
Admission: RE | Admit: 2020-10-28 | Discharge: 2020-10-28 | Disposition: A | Discharge status: Home / Self Care | Payer: Medicare Other | Source: Ambulatory Visit | Attending: Radiation Oncology | Admitting: Radiation Oncology

## 2020-10-28 DIAGNOSIS — C61 Malignant neoplasm of prostate: Secondary | ICD-10-CM | POA: Insufficient documentation

## 2020-10-28 HISTORY — DX: Malignant neoplasm of prostate: C61

## 2020-10-28 NOTE — Progress Notes (Signed)
Radiation Oncology         [phone] ________________________________  Initial Outpatient Consultation - Conducted via MyChart due to current COVID-19 concerns for limiting patient exposure  Name: Tanner Young MRN: [id number]  Date: 10/28/2020  DOB: October 30, 1955  TK:ZSWFUXNA, Einar Pheasant, DO  Janith Lima, MD   REFERRING PHYSICIAN: Janith Lima, MD  DIAGNOSIS: 65 y.o. 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    ICD-10-CM   1. Malignant neoplasm of prostate ([place])  C61     HISTORY OF PRESENT ILLNESS: Tanner Young is a 65 y.o. male with a diagnosis of prostate cancer. He was noted to have an elevated PSA of 4.91 in 06/2020, by his primary care physician, Dr. Zigmund Daniel. He also had a PSA performed at the [facility] in 06/2020, also elevated at 5.46. Accordingly, he was referred for evaluation in urology by Dr. Abner Greenspan on 08/20/2020,  digital rectal examination was performed at that time revealing no nodules. He had a repeat PSA performed at the [place] on 08/22/2020 showing relatively stable elevation at 5.78.  The patient proceeded to transrectal ultrasound with 12 biopsies of the prostate on 09/18/2020, under the care of Dr. Abner Greenspan.  The prostate volume measured 38 cc.  Out of 12 core biopsies, 5 were positive.  The maximum Gleason score was 4+4, and this was seen in the left base. Additionally, Gleason 4+3 was seen in the left base lateral (with PNI), Gleason 3+4 in the left mid lateral (small focus) and right apex lateral, and Gleason 3+3 in the right apex.   He met with Dr. Darcus Austin at [facility] on 09/30/2020 for a second opinion and is scheduled for consultation with Dr. Truman Hayward in radiation oncology at [facility] later today. He is scheduled for staging scans with PET and CT A/P at [facility] on 10/31/2020, as well as a bone scan here on 11/17/2020 and CT A/P at [facility] on 11/19/20.  The patient reviewed the biopsy results with his urologist and he has kindly been referred  today for discussion of potential radiation treatment options.   PREVIOUS RADIATION THERAPY: No  PAST MEDICAL HISTORY:  Past Medical History:  Diagnosis Date  . Arthritis   . Chronic back pain   . Chronic kidney disease   . Cough   . Diabetes mellitus type II, controlled ([place])   . Dry skin   . Floaters in visual field   . GERD (gastroesophageal reflux disease)   . Glaucoma, both eyes   . Hay fever   . Heartburn   . Hyperlipemia   . Hypertension   . Itching   . Joint pain   . Kidney disease   . Lactose intolerance   . Leg cramp   . Low back pain   . Multiple food allergies   . Muscle pain   . Neuropathy    rt leg from DDD  . Prostate cancer ([place])   . Rash   . Sleep apnea    uses a cpap  . Stiff neck   . Swelling of lower extremity   . Trouble in sleeping   . Vision changes   . Vitamin D deficiency   . Wears glasses   . Wears partial dentures    top mid partial-flipper      PAST SURGICAL HISTORY: Past Surgical History:  Procedure Laterality Date  . COLONOSCOPY    . DENTAL SURGERY     extractions  . EYE  SURGERY    . ORIF PATELLA Right 01/24/2013   Procedure: OPEN REDUCTION INTERNAL (ORIF) FIXATION PATELLA;  Surgeon: Kerin Salen, MD;  Location: [place];  Service: Orthopedics;  Laterality: Right;  orif right patella,patellectomy     FAMILY HISTORY:  Family History  Problem Relation Age of Onset  . Arthritis Mother   . Hearing loss Mother   . Hyperlipidemia Mother   . Hypertension Mother   . Diabetes Mother   . Cancer Father   . Hypertension Father   . Diabetes Brother   . Hyperlipidemia Brother   . Hypertension Brother   . Breast cancer Neg Hx   . Prostate cancer Neg Hx   . Colon cancer Neg Hx   . Pancreatic cancer Neg Hx     SOCIAL HISTORY:  Social History   Socioeconomic History  . Marital status: Married    Spouse name: Runner, broadcasting/film/video  . Number of children: 5  . Years of education: Not on file  . Highest education level: Not  on file  Occupational History    Comment: retired  Tobacco Use  . Smoking status: Never Smoker  . Smokeless tobacco: Never Used  Vaping Use  . Vaping Use: Never used  Substance and Sexual Activity  . Alcohol use: No  . Drug use: No  . Sexual activity: Not Currently  Other Topics Concern  . Not on file  Social History Narrative   3 sons. 2 daughters.   Social Determinants of Health   Financial Resource Strain: Not on file  Food Insecurity: Not on file  Transportation Needs: Not on file  Physical Activity: Not on file  Stress: Not on file  Social Connections: Not on file  Intimate Partner Violence: Not on file    ALLERGIES: Shellfish allergy and Lisinopril  MEDICATIONS:  Current Outpatient Medications  Medication Sig Dispense Refill  . amLODipine (NORVASC) 5 MG tablet Take 5 mg by mouth daily.    . Aspirin Buf,CaCarb-MgCarb-MgO, 81 MG TABS Take by mouth.    . benazepril (LOTENSIN) 40 MG tablet Take 40 mg by mouth daily.    . blood glucose meter kit and supplies KIT Dispense based on patient and insurance preference. Use daily as directed to monitor blood sugar. (FOR ICD-9 250.00, 250.01). 1 each 0  . brimonidine (ALPHAGAN) 0.15 % ophthalmic solution Place 1 drop into both eyes 2 (two) times daily at 10 AM and 5 PM.    . cetirizine (ZYRTEC) 10 MG tablet Take 10 mg by mouth daily.    . cholecalciferol (VITAMIN D) 1000 UNITS tablet Take 1,000 Units by mouth daily.    [name] desonide (DESOWEN) 0.05 % cream Apply topically 2 (two) times daily.    . dorzolamide-timolol (COSOPT) 22.3-6.8 MG/ML ophthalmic solution Place 1 drop into both eyes 2 (two) times daily.     . Easy Touch Lancets 26G MISC Use as directed daily 100 each 1  . empagliflozin (JARDIANCE) 25 MG TABS tablet Take 0.5 tablets by mouth daily.    . fluocinonide (LIDEX) 0.05 % external solution APPLY SMALL AMOUNT TO AFFECTED AREA EVERY 7 DAYS AS NEEDED FOR SCALP ITCH    . fluticasone (FLONASE) 50 MCG/ACT nasal spray INSTILL  1 SPRAY IN EACH NOSTRIL TWICE A DAY    . gabapentin (NEURONTIN) 300 MG capsule Take 300 mg by mouth at bedtime.    [name] glucose blood (PRECISION XTRA TEST STRIPS) test strip Use as directed daily. 100 each 1  . ketoconazole (NIZORAL) 2 %  cream Apply 1 application topically daily.    . Latanoprostene Bunod 0.024 % SOLN INSTILL 1 DROP IN BOTH EYES AT BEDTIME (APPROVED)    . metFORMIN (GLUCOPHAGE-XR) 500 MG 24 hr tablet TAKE 1 TABLET(500 MG) BY MOUTH DAILY WITH BREAKFAST 90 tablet 2  . nystatin-triamcinolone ointment (MYCOLOG) APPLY MODERATE AMOUNT TO AFFECTED AREA TWICE A DAY    . omeprazole (PRILOSEC) 20 MG capsule Take 20 mg by mouth at bedtime.    [name] PRESCRIPTION MEDICATION Apply 1 application topically daily. Cream to feet     . PRESCRIPTION MEDICATION Apply 1 application topically daily. Cream to back     . simvastatin (ZOCOR) 20 MG tablet Take 10 mg by mouth at bedtime.    . terbinafine (LAMISIL) 1 % cream Apply 1 application topically 2 (two) times daily.    [name] triamcinolone ointment (KENALOG) 0.1 % Apply 1 application topically 2 (two) times daily.    [name] triamterene-hydrochlorothiazide (MAXZIDE) 75-50 MG per tablet Take 0.5 tablets by mouth daily.    . cyclobenzaprine (FLEXERIL) 10 MG tablet Take 0.5-1 tablets (5-10 mg total) by mouth 3 (three) times daily as needed for muscle spasms. (Patient not taking: Reported on 10/28/2020) 30 tablet 0  . sildenafil (VIAGRA) 100 MG tablet Take 100 mg by mouth daily as needed for erectile dysfunction. (Patient not taking: Reported on 10/28/2020)     No current facility-administered medications for this encounter.    REVIEW OF SYSTEMS:  On review of systems, the patient reports that he is doing well overall. He denies any chest pain, shortness of breath, cough, fevers, chills, night sweats, unintended weight changes. He denies any bowel disturbances, and denies abdominal pain, nausea or vomiting. He denies any new musculoskeletal or joint aches or pains. His  IPSS was 0, indicating no urinary symptoms. His SHIM was 2, indicating he has severe erectile dysfunction. A complete review of systems is obtained and is otherwise negative.    PHYSICAL EXAM:  Wt Readings from Last 3 Encounters:  10/28/20 246 lb (111.6 kg)  06/23/20 254 lb 9.6 oz (115.5 kg)  04/28/20 251 lb (113.9 kg)   Temp Readings from Last 3 Encounters:  06/08/19 98.2 F (36.8 C) (Oral)  02/21/19 98.3 F (36.8 C) (Oral)  10/05/18 98.4 F (36.9 C) (Oral)   BP Readings from Last 3 Encounters:  06/23/20 (!) 146/72  04/28/20 122/78  06/08/19 110/80   Pulse Readings from Last 3 Encounters:  06/23/20 (!) 49  04/28/20 (!) 46  06/08/19 (!) 45   Pain Assessment Pain Score: 0-No pain (Denies new pain)/10  In general this is a well appearing African American man in no acute distress. He's alert and oriented x4 and appropriate throughout the examination. Cardiopulmonary assessment is negative for acute distress and he exhibits normal effort.    KPS = 100  100 - Normal; no complaints; no evidence of disease. 90   - Able to carry on normal activity; minor signs or symptoms of disease. 80   - Normal activity with effort; some signs or symptoms of disease. 46   - Cares for self; unable to carry on normal activity or to do active work. 60   - Requires occasional assistance, but is able to care for most of his personal needs. 50   - Requires considerable assistance and frequent medical care. 81   - Disabled; requires special care and assistance. 21   - Severely disabled; hospital admission is indicated although death not imminent. 20   - Very  sick; hospital admission necessary; active supportive treatment necessary. 10   - Moribund; fatal processes progressing rapidly. 0     - Dead  Karnofsky DA, Abelmann [place], Craver LS and Burchenal [facility] [phone]) The use of the nitrogen mustards in the palliative treatment of carcinoma: with particular reference to bronchogenic carcinoma Cancer 1  634-56  LABORATORY DATA:  Lab Results  Component Value Date   WBC 5.2 01/20/2018   HGB 13.0 01/20/2018   HCT 37.9 (L) 01/20/2018   MCV 88.7 01/20/2018   PLT 188.0 01/20/2018   Lab Results  Component Value Date   NA 136 06/08/2019   K 4.1 06/08/2019   CL 104 06/08/2019   CO2 24 06/08/2019   Lab Results  Component Value Date   ALT 20 06/08/2019   AST 18 06/08/2019   ALKPHOS 38 (L) 06/08/2019   BILITOT 0.9 06/08/2019     RADIOGRAPHY: No results found.    IMPRESSION/PLAN: This visit was conducted via MyChart to spare the patient unnecessary potential exposure in the healthcare setting during the current COVID-19 pandemic. 1. 65 y.o. gentleman with Stage T1c adenocarcinoma of the prostate with Gleason Score of 4+4, and PSA of 5.78. We discussed the patient's workup and outlined the nature of prostate cancer in this setting. The patient's T stage, Gleason's score, and PSA put him into the high risk group. Accordingly, he is scheduled for disease staging with PET and CT A/P at [facility] on 10/31/2020 to fully assess the extent of his disease.  Pending there are no unanticipated findings of distant or widespread metastases, he would be eligible for a variety of potential treatment options including prostatectomy or LT-ADT in combination with either 8 weeks of external radiation or 5 weeks of external radiation preceded by a brachytherapy boost. We discussed the available radiation techniques, and focused on the details and logistics of delivery. We discussed and outlined the risks, benefits, short and long-term effects associated with radiotherapy and compared and contrasted these with prostatectomy. We discussed the role of SpaceOAR in reducing the rectal toxicity associated with radiotherapy. We also detailed the role of ADT in the treatment of high risk prostate cancer and outlined the associated side effects that could be expected with this therapy. He appears to have a good understanding of his  disease and our treatment recommendations which are of curative intent.  He was encouraged to ask questions that were answered to his stated satisfaction.  At the end of the conversation, the patient remains undecided regarding treatment preference but is in agreement to proceed with the upcoming disease staging scans on 10/31/2020.  Pending those results, he would like to take additional time to consider his options. We encouraged him to reach out with any additional questions. We will share our discussion with Dr. Abner Greenspan and look forward to following along in the care of this very nice gentleman.  Of course, we would be more than happy to continue to participate in his care should he elect to proceed with radiotherapy here locally in [place].  Given current concerns for patient exposure during the COVID-19 pandemic, this encounter was conducted via video-enabled MyChart visit. The patient has given verbal consent for this type of encounter. The time spent during this encounter was 60 minutes. The attendants for this meeting include Tyler Pita MD, Ashlyn Bruning PA-C, [place], and patient, Tanner Young his wife (with their son via speakerphone). During the encounter, Tyler Pita MD, Ashlyn Bruning PA-C, and scribe, Wilburn Mylar were located  at [facility] Radiation Oncology Department.  Patient, Tanner Young and his wife were located at home.    Nicholos Johns, PA-C    Tyler Pita, MD  [facility] Direct Dial: [phone]  Fax: [phone] [email or web address]  Skype  [email or web address]   This document serves as a record of services personally performed by Tyler Pita, MD and Freeman Caldron, PA-C. It was created on their behalf by Wilburn Mylar, a trained medical scribe. The creation of this record is based on the scribe's personal observations and the provider's statements to them. This document has been checked  and approved by the attending provider.

## 2020-10-28 NOTE — Telephone Encounter (Signed)
Pt lvm stating recent dx of prostate cancer. Requesting a letter for [employer] trip cancellation.  Requesting contact information for oncology counseling.   Per Dr. Zigmund Daniel,   1. Contact Urologist for cancellation letter due to their knowledge of his treatment process duration.  2. Provided Oncology Nurse Navigators [phone]) for help navigating the oncology process.

## 2020-10-28 NOTE — Progress Notes (Signed)
Spoke with patient to introduce myself as the prostate nurse navigator and discuss my role. He consulted with Dr. Tammi Klippel this morning via My Chart visit. He saw Dr. Sabino Niemann at [facility] for a second opinion and is schedule for a CT 3/18 and a bone scan 4/4. Once all his staging studies are done, he will make a treatment decision. I gave him my contact information and asked him to call me with questions or concerns. He voiced understanding.

## 2020-11-14 ENCOUNTER — Encounter: Payer: Self-pay | Admitting: Medical Oncology

## 2020-11-14 NOTE — Progress Notes (Signed)
Spoke with patient to follow up on treatment decision. He had staging bone scan and CT @ [facility] 3/18 which were negative. He states he was leaning towards radiation but now, thinking about doing surgery. He has follow up with Dr. Abner Greenspan 11/24/20 and will discuss further before make his final decision. I will follow up with him after that visit to confirm his decision. Ashlyn, PA notified of the above.

## 2020-11-17 ENCOUNTER — Encounter (HOSPITAL_COMMUNITY): Payer: Self-pay

## 2020-11-17 ENCOUNTER — Other Ambulatory Visit (HOSPITAL_COMMUNITY): Payer: Medicare Other

## 2020-12-18 ENCOUNTER — Other Ambulatory Visit: Payer: Self-pay | Admitting: Urology

## 2021-02-02 ENCOUNTER — Other Ambulatory Visit (HOSPITAL_COMMUNITY): Payer: Medicare Other

## 2021-02-03 HISTORY — PX: PROSTATECTOMY: SHX69

## 2021-02-09 ENCOUNTER — Ambulatory Visit: Admit: 2021-02-09 | Payer: Medicare Other | Admitting: Urology

## 2021-02-09 SURGERY — PROSTATECTOMY, RADICAL, ROBOT-ASSISTED, LAPAROSCOPIC
Anesthesia: General

## 2021-04-13 ENCOUNTER — Ambulatory Visit (INDEPENDENT_AMBULATORY_CARE_PROVIDER_SITE_OTHER): Payer: Medicare Other | Admitting: Family Medicine

## 2021-04-13 ENCOUNTER — Other Ambulatory Visit: Payer: Self-pay

## 2021-04-13 DIAGNOSIS — Z0000 Encounter for general adult medical examination without abnormal findings: Secondary | ICD-10-CM

## 2021-04-13 NOTE — Progress Notes (Signed)
MEDICARE ANNUAL WELLNESS VISIT  04/13/2021  Telephone Visit Disclaimer This Medicare AWV was conducted by telephone due to national recommendations for restrictions regarding the COVID-19 Pandemic (e.g. social distancing).  I verified, using two identifiers, that I am speaking with Tanner Young or their authorized healthcare agent. I discussed the limitations, risks, security, and privacy concerns of performing an evaluation and management service by telephone and the potential availability of an in-person appointment in the future. The patient expressed understanding and agreed to proceed.  Location of Patient: Home Location of Provider (nurse):  In the office.  Subjective:    Tanner Young is a 65 y.o. male patient of Everrett Coombe, DO who had a Medicare Annual Wellness Visit today via telephone. [name] is Retired and lives with their spouse. he has 5 children. he reports that he is socially active and does interact with friends/family regularly. he is minimally physically active and enjoys walking.  Patient Care Team: Everrett Coombe, DO as PCP - General (Family Medicine) Nahser, Deloris Ping, MD as PCP - Cardiology (Cardiology) Felicita Gage, RN as Oncology Nurse Navigator  Advanced Directives 04/13/2021 10/28/2020 01/23/2013  Does Patient Have a Medical Advance Directive? No No Patient has advance directive, copy not in chart  Would patient like information on creating a medical advance directive? No - Patient declined No - Patient declined -    Hospital Utilization Over the Past 12 Months: # of hospitalizations or ER visits: 1 # of surgeries: 1  Review of Systems    Patient reports that his overall health is worse compared to last year.  History obtained from chart review and the patient  Patient Reported Readings (BP, Pulse, CBG, Weight, etc) none  Pain Assessment Pain : 0-10 Pain Score: 4  Pain Type: Chronic pain Pain Location: Back Pain Orientation: Lower Pain  Descriptors / Indicators: Aching, Burning Pain Onset: More than a month ago Pain Frequency: Constant Pain Relieving Factors: Injection Effect of Pain on Daily Activities: Some pain it does effect his daily activities  Pain Relieving Factors: Injection  Current Medications & Allergies (verified) Allergies as of 04/13/2021       Reactions   Shellfish Allergy Itching, Nausea And Vomiting   Shrimp   Lisinopril Cough        Medication List        Accurate as of April 13, 2021 11:10 AM. If you have any questions, ask your nurse or doctor.          amLODipine 5 MG tablet Commonly known as: NORVASC Take 5 mg by mouth daily.   Aspirin Buf(CaCarb-MgCarb-MgO) 81 MG Tabs Take by mouth.   benazepril 40 MG tablet Commonly known as: LOTENSIN Take 40 mg by mouth daily.   blood glucose meter kit and supplies Kit Dispense based on patient and insurance preference. Use daily as directed to monitor blood sugar. (FOR ICD-9 250.00, 250.01).   brimonidine 0.15 % ophthalmic solution Commonly known as: ALPHAGAN Place 1 drop into both eyes 2 (two) times daily at 10 AM and 5 PM.   cetirizine 10 MG tablet Commonly known as: ZYRTEC Take 10 mg by mouth daily. As needed.   cholecalciferol 1000 units tablet Commonly known as: VITAMIN D Take 1,000 Units by mouth daily.   cyclobenzaprine 10 MG tablet Commonly known as: FLEXERIL Take 0.5-1 tablets (5-10 mg total) by mouth 3 (three) times daily as needed for muscle spasms.   desonide 0.05 % cream Commonly known as: DESOWEN Apply topically 2 (two)  times daily.   dorzolamide-timolol 22.3-6.8 MG/ML ophthalmic solution Commonly known as: COSOPT Place 1 drop into both eyes 2 (two) times daily.   Easy Touch Lancets 26G Misc Use as directed daily   empagliflozin 25 MG Tabs tablet Commonly known as: JARDIANCE Take 0.5 tablets by mouth daily.   fluocinonide 0.05 % external solution Commonly known as: LIDEX APPLY SMALL AMOUNT TO  AFFECTED AREA EVERY 7 DAYS AS NEEDED FOR SCALP ITCH   fluticasone 50 MCG/ACT nasal spray Commonly known as: FLONASE INSTILL 1 SPRAY IN EACH NOSTRIL TWICE A DAY   gabapentin 300 MG capsule Commonly known as: NEURONTIN TAKE TWO CAPSULES BY MOUTH AT BEDTIME AND TAKE ONE CAPSULE TWICE A DAY   gabapentin 300 MG capsule Commonly known as: NEURONTIN Take 300 mg by mouth at bedtime. Twice a day.   ketoconazole 2 % cream Commonly known as: NIZORAL Apply 1 application topically daily.   Latanoprostene Bunod 0.024 % Soln INSTILL 1 DROP IN BOTH EYES AT BEDTIME (APPROVED)   metFORMIN 500 MG 24 hr tablet Commonly known as: GLUCOPHAGE-XR TAKE 1 TABLET(500 MG) BY MOUTH DAILY WITH BREAKFAST   nystatin-triamcinolone ointment Commonly known as: MYCOLOG APPLY MODERATE AMOUNT TO AFFECTED AREA TWICE A DAY   omeprazole 20 MG capsule Commonly known as: PRILOSEC Take 20 mg by mouth at bedtime.   PRECISION XTRA TEST STRIPS test strip Generic drug: glucose blood Use as directed daily.   PRESCRIPTION MEDICATION Apply 1 application topically daily. Cream to feet   PRESCRIPTION MEDICATION Apply 1 application topically daily. Cream to back   sildenafil 100 MG tablet Commonly known as: VIAGRA Take 100 mg by mouth daily as needed for erectile dysfunction.   simvastatin 20 MG tablet Commonly known as: ZOCOR Take 10 mg by mouth at bedtime.   terbinafine 1 % cream Commonly known as: LAMISIL Apply 1 application topically 2 (two) times daily.   triamcinolone ointment 0.1 % Commonly known as: KENALOG Apply 1 application topically 2 (two) times daily.   triamterene-hydrochlorothiazide 75-50 MG tablet Commonly known as: MAXZIDE Take 0.5 tablets by mouth daily.        History (reviewed): Past Medical History:  Diagnosis Date   Arthritis    Chronic back pain    Chronic kidney disease    Cough    Diabetes mellitus type II, controlled ([place])    Dry skin    Floaters in visual field     GERD (gastroesophageal reflux disease)    Glaucoma, both eyes    Hay fever    Heartburn    Hyperlipemia    Hypertension    Itching    Joint pain    Kidney disease    Lactose intolerance    Leg cramp    Low back pain    Multiple food allergies    Muscle pain    Neuropathy    rt leg from DDD   Prostate cancer (HCC)    Rash    Sleep apnea    uses a cpap   Stiff neck    Swelling of lower extremity    Trouble in sleeping    Vision changes    Vitamin D deficiency    Wears glasses    Wears partial dentures    top mid partial-flipper   Past Surgical History:  Procedure Laterality Date   COLONOSCOPY     DENTAL SURGERY     extractions   EYE SURGERY     ORIF PATELLA Right 01/24/2013   Procedure: OPEN REDUCTION  INTERNAL (ORIF) FIXATION PATELLA;  Surgeon: Kerin Salen, MD;  Location: [facility];  Service: Orthopedics;  Laterality: Right;  orif right patella,patellectomy    PROSTATECTOMY  02/03/2021   [facility]   Family History  Problem Relation Age of Onset   Arthritis Mother    Hearing loss Mother    Hyperlipidemia Mother    Hypertension Mother    Diabetes Mother    Cancer Father    Hypertension Father    Diabetes Brother    Hyperlipidemia Brother    Hypertension Brother    Breast cancer Neg Hx    Prostate cancer Neg Hx    Colon cancer Neg Hx    Pancreatic cancer Neg Hx    Social History   Socioeconomic History   Marital status: Married    Spouse name: [name]   Number of children: 5   Years of education: 13   Highest education level: Some college, no degree  Occupational History    Comment: retired  Tobacco Use   Smoking status: Never   Smokeless tobacco: Never  Vaping Use   Vaping Use: Never used  Substance and Sexual Activity   Alcohol use: No   Drug use: No   Sexual activity: Not Currently  Other Topics Concern   Not on file  Social History Narrative   Lives with his wife. He enjoys walking everyday.   Social Determinants of Health    Financial Resource Strain: Low Risk    Difficulty of Paying Living Expenses: Not hard at all  Food Insecurity: No Food Insecurity   Worried About Charity fundraiser in the Last Year: Never true   [place] in the Last Year: Never true  Transportation Needs: No Transportation Needs   Lack of Transportation (Medical): No   Lack of Transportation (Non-Medical): No  Physical Activity: Inactive   Days of Exercise per Week: 0 days   Minutes of Exercise per Session: 0 min  Stress: No Stress Concern Present   Feeling of Stress : Not at all  Social Connections: Moderately Isolated   Frequency of Communication with Friends and Family: Once a week   Frequency of Social Gatherings with Friends and Family: Once a week   Attends Religious Services: More than 4 times per year   Active Member of [employer] or Organizations: No   Attends Archivist Meetings: Never   Marital Status: Married    Activities of Daily Living In your present state of health, do you have any difficulty performing the following activities: 04/13/2021  Hearing? N  Vision? N  Difficulty concentrating or making decisions? N  Walking or climbing stairs? Y  Comment due to his knee stiffness.  Dressing or bathing? N  Doing errands, shopping? N  Preparing Food and eating ? N  Using the Toilet? N  In the past six months, have you accidently leaked urine? Y  Comment had prostate surgery and is in rehab for pelvic floor therapy.  Do you have problems with loss of bowel control? N  Managing your Medications? N  Managing your Finances? N  Housekeeping or managing your Housekeeping? N  Some recent data might be hidden    Patient Education/ Literacy How often do you need to have someone help you when you read instructions, pamphlets, or other written materials from your doctor or pharmacy?: 1 - Never What is the last grade level you completed in school?: Some college  Exercise Current Exercise Habits: Home  exercise  routine  Diet Patient reports consuming  2-3  meals a day and 2-3 snack(s) a day Patient reports that his primary diet is: Regular Patient reports that she does have regular access to food.   Depression Screen PHQ 2/9 Scores 04/13/2021 01/30/2020 06/08/2019 05/08/2018 01/20/2018  PHQ - 2 Score 0 1 0 0 0  PHQ- 9 Score - 2 - 5 -     Fall Risk Fall Risk  04/13/2021 01/30/2020 06/08/2019  Falls in the past year? 0 0 0  Number falls in past yr: 0 0 -  Injury with Fall? 0 0 -  Risk for fall due to : No Fall Risks - -  Follow up Falls evaluation completed - -     Objective:  Tanner Young seemed alert and oriented and he participated appropriately during our telephone visit.  Blood Pressure Weight BMI  BP Readings from Last 3 Encounters:  06/23/20 (!) 146/72  04/28/20 122/78  06/08/19 110/80   Wt Readings from Last 3 Encounters:  10/28/20 246 lb (111.6 kg)  06/23/20 254 lb 9.6 oz (115.5 kg)  04/28/20 251 lb (113.9 kg)   BMI Readings from Last 1 Encounters:  10/28/20 35.30 kg/m    *Unable to obtain current vital signs, weight, and BMI due to telephone visit type  Hearing/Vision  Tanner Young did not seem to have difficulty with hearing/understanding during the telephone conversation Reports that he has had a formal eye exam by an eye care professional within the past year Reports that he has not had a formal hearing evaluation within the past year *Unable to fully assess hearing and vision during telephone visit type  Cognitive Function: 6CIT Screen 04/13/2021  What Year? 0 points  What month? 0 points  What time? 0 points  Count back from 20 0 points  Months in reverse 0 points  Repeat phrase 2 points  Total Score 2   (Normal:0-7, Significant for Dysfunction: >8)  Normal Cognitive Function Screening: Yes   Immunization & Health Maintenance Record Immunization History  Administered Date(s) Administered   Fluad Quad(high Dose 65+) 05/01/2020   H1N1 08/22/2008    Influenza Split 04/16/2014, 04/17/2015, 04/16/2016   Influenza,inj,Quad PF,6+ Mos 05/17/2017, 05/01/2018, 04/13/2019   Influenza-Unspecified 06/29/2002, 06/12/2004, 06/10/2005, 06/24/2006, 06/28/2007, 05/20/2008, 06/08/2010, 05/11/2011, 05/15/2012, 05/24/2013, 05/29/2015, 04/01/2016, 04/24/2018, 05/24/2019, 06/04/2020   Moderna Sars-Covid-2 Vaccination 09/04/2019, 10/03/2019, 06/14/2020, 09/03/2020   Td (Adult) 09/14/2018   Tdap 08/22/2008   Tetanus 08/22/2008   Zoster Recombinat (Shingrix) 04/13/2019, 06/15/2019    Health Maintenance  Topic Date Due   OPHTHALMOLOGY EXAM  04/13/2021 (Originally 05/14/2020)   COVID-19 Vaccine (5 - Booster for Moderna series) 04/29/2021 (Originally 01/01/2021)   HEMOGLOBIN A1C  05/15/2021 (Originally 12/07/2019)   INFLUENZA VACCINE  11/13/2021 (Originally 03/16/2021)   FOOT EXAM  04/13/2022 (Originally 06/07/2020)   TETANUS/TDAP  04/13/2022 (Originally 08/22/2018)   Hepatitis C Screening  04/13/2022 (Originally 08/06/1974)   PNA vac Low Risk Adult (1 of 2 - PCV13) 04/13/2022 (Originally 08/07/2019)   COLONOSCOPY (Pts 45-69yrs Insurance coverage will need to be confirmed)  09/21/2028   Zoster Vaccines- Shingrix  Completed   HPV VACCINES  Aged Out       Assessment  This is a routine wellness examination for [employer].  Health Maintenance: Due or Overdue There are no preventive care reminders to display for this patient.   Tanner Young does not need a referral for Community Assistance: Care Management:   no Social Work:    no Prescription Assistance:  no Nutrition/Diabetes Education:  no   Plan:  Personalized Goals  Goals Addressed               This Visit's Progress     Patient Stated (pt-stated)        04/13/2021 AWV Goal: Exercise for General Health  Patient will verbalize understanding of the benefits of increased physical activity: Exercising regularly is important. It will improve your overall fitness, flexibility, and  endurance. Regular exercise also will improve your overall health. It can help you control your weight, reduce stress, and improve your bone density. Over the next year, patient will increase physical activity as tolerated with a goal of at least 150 minutes of moderate physical activity per week.  You can tell that you are exercising at a moderate intensity if your heart starts beating faster and you start breathing faster but can still hold a conversation. Moderate-intensity exercise ideas include: Walking 1 mile (1.6 km) in about 15 minutes Biking Hiking Golfing Dancing Water aerobics Patient will verbalize understanding of everyday activities that increase physical activity by providing examples like the following: Yard work, such as: Sales promotion account executive Gardening Washing windows or floors Patient will be able to explain general safety guidelines for exercising:  Before you start a new exercise program, talk with your health care provider. Do not exercise so much that you hurt yourself, feel dizzy, or get very short of breath. Wear comfortable clothes and wear shoes with good support. Drink plenty of water while you exercise to prevent dehydration or heat stroke. Work out until your breathing and your heartbeat get faster.        Personalized Health Maintenance & Screening Recommendations  Pneumococcal vaccine  Influenza vaccine Td vaccine Hemoglobin A1c Eye exam Foot exam  Lung Cancer Screening Recommended: no (Low Dose CT Chest recommended if Age 57-80 years, 30 pack-year currently smoking OR have quit w/in past 15 years) Hepatitis C Screening recommended: yes HIV Screening recommended: no  Advanced Directives: Written information was not prepared per patient's request.  Referrals & Orders No orders of the defined types were placed in this encounter.   Follow-up Plan Follow-up with  [name], DO as planned Schedule your flu vaccine, and pneumonia vaccine at our office or at the [place].  Tetanus shot can be done at the [place] or the pharmacy. Hemoglobin A1c, foot exam can be done at your next in-office appointment.  Let us know when you had your last eye exam.  Medicare wellness visit in one year. AVS printed and mailed to the patient.   I have personally reviewed and noted the following in the patient's chart:   Medical and social history Use of alcohol, tobacco or illicit drugs  Current medications and supplements Functional ability and status Nutritional status Physical activity Advanced directives List of other physicians Hospitalizations, surgeries, and ER visits in previous 12 months Vitals Screenings to include cognitive, depression, and falls Referrals and appointments  In addition, I have reviewed and discussed with Tanner Young certain preventive protocols, quality metrics, and best practice recommendations. A written personalized care plan for preventive services as well as general preventive health recommendations is available and can be mailed to the patient at his request.      Tinnie Gens, RN  04/13/2021

## 2021-04-13 NOTE — Patient Instructions (Addendum)
[place] Maintenance Summary and Written Plan of Care  [name] ,  Thank you for allowing me to perform your Medicare Annual Wellness Visit and for your ongoing commitment to your health.   Health Maintenance & Immunization History Health Maintenance  Topic Date Due   OPHTHALMOLOGY EXAM  04/13/2021 (Originally 05/14/2020)   COVID-19 Vaccine (5 - Booster for Moderna series) 04/29/2021 (Originally 01/01/2021)   HEMOGLOBIN A1C  05/15/2021 (Originally 12/07/2019)   INFLUENZA VACCINE  11/13/2021 (Originally 03/16/2021)   FOOT EXAM  04/13/2022 (Originally 06/07/2020)   TETANUS/TDAP  04/13/2022 (Originally 08/22/2018)   Hepatitis C Screening  04/13/2022 (Originally 08/06/1974)   PNA vac Low Risk Adult (1 of 2 - PCV13) 04/13/2022 (Originally 08/07/2019)   COLONOSCOPY (Pts 45-56yr Insurance coverage will need to be confirmed)  09/21/2028   Zoster Vaccines- Shingrix  Completed   HPV VACCINES  Aged Out   Immunization History  Administered Date(s) Administered   Fluad Quad(high Dose 65+) 05/01/2020   H1N1 08/22/2008   Influenza Split 04/16/2014, 04/17/2015, 04/16/2016   Influenza,inj,Quad PF,6+ Mos 05/17/2017, 05/01/2018, 04/13/2019   Influenza-Unspecified 06/29/2002, 06/12/2004, 06/10/2005, 06/24/2006, 06/28/2007, 05/20/2008, 06/08/2010, 05/11/2011, 05/15/2012, 05/24/2013, 05/29/2015, 04/01/2016, 04/24/2018, 05/24/2019, 06/04/2020   Moderna Sars-Covid-2 Vaccination 09/04/2019, 10/03/2019, 06/14/2020, 09/03/2020   Td (Adult) 09/14/2018   Tdap 08/22/2008   Tetanus 08/22/2008   Zoster Recombinat (Shingrix) 04/13/2019, 06/15/2019    These are the patient goals that we discussed:  Goals Addressed               This Visit's Progress     Patient Stated (pt-stated)        04/13/2021 AWV Goal: Exercise for General Health  Patient will verbalize understanding of the benefits of increased physical activity: Exercising regularly is important. It will improve your  overall fitness, flexibility, and endurance. Regular exercise also will improve your overall health. It can help you control your weight, reduce stress, and improve your bone density. Over the next year, patient will increase physical activity as tolerated with a goal of at least 150 minutes of moderate physical activity per week.  You can tell that you are exercising at a moderate intensity if your heart starts beating faster and you start breathing faster but can still hold a conversation. Moderate-intensity exercise ideas include: Walking 1 mile (1.6 km) in about 15 minutes Biking Hiking Golfing Dancing Water aerobics Patient will verbalize understanding of everyday activities that increase physical activity by providing examples like the following: Yard work, such as: PSales promotion account executiveGardening Washing windows or floors Patient will be able to explain general safety guidelines for exercising:  Before you start a new exercise program, talk with your health care provider. Do not exercise so much that you hurt yourself, feel dizzy, or get very short of breath. Wear comfortable clothes and wear shoes with good support. Drink plenty of water while you exercise to prevent dehydration or heat stroke. Work out until your breathing and your heartbeat get faster.          This is a list of Health Maintenance Items that are overdue or due now: Pneumococcal vaccine  Influenza vaccine Td vaccine Hemoglobin A1c Eye exam Foot exam  Orders/Referrals Placed Today: No orders of the defined types were placed in this encounter.  (Contact our referral department at [phone] you have not spoken with someone about your referral appointment within the next 5 days)  Follow-up Plan Follow-up with [name], DO as planned Schedule your flu vaccine, and pneumonia vaccine at our office or at the [facility].   Tetanus shot can be done at the [facility] or the pharmacy. Hemoglobin A1c, foot exam can be done at your next in-office appointment.  Let us know when you had your last eye exam.  Medicare wellness visit in one year. AVS printed and mailed to the patient.      Health Maintenance, Male Adopting a healthy lifestyle and getting preventive care are important in promoting health and wellness. Ask your health care provider about: The right schedule for you to have regular tests and exams. Things you can do on your own to prevent diseases and keep yourself healthy. What should I know about diet, weight, and exercise? Eat a healthy diet  Eat a diet that includes plenty of vegetables, fruits, low-fat dairy products, and lean protein. Do not eat a lot of foods that are high in solid fats, added sugars, or sodium.  Maintain a healthy weight Body mass index (BMI) is a measurement that can be used to identify possible weight problems. It estimates body fat based on height and weight. Your health care provider can help determine your BMI and help you achieve or maintain ahealthy weight. Get regular exercise Get regular exercise. This is one of the most important things you can do for your health. Most adults should: Exercise for at least 150 minutes each week. The exercise should increase your heart rate and make you sweat (moderate-intensity exercise). Do strengthening exercises at least twice a week. This is in addition to the moderate-intensity exercise. Spend less time sitting. Even light physical activity can be beneficial. Watch cholesterol and blood lipids Have your blood tested for lipids and cholesterol at 67 years of age, then havethis test every 5 years. You may need to have your cholesterol levels checked more often if: Your lipid or cholesterol levels are high. You are older than 67 years of age. You are at high risk for heart disease. What should I know about cancer screening? Many types  of cancers can be detected early and may often be prevented. Depending on your health history and family history, you may need to have cancer screening at various ages. This may include screening for: Colorectal cancer. Prostate cancer. Skin cancer. Lung cancer. What should I know about heart disease, diabetes, and high blood pressure? Blood pressure and heart disease High blood pressure causes heart disease and increases the risk of stroke. This is more likely to develop in people who have high blood pressure readings, are of African descent, or are overweight. Talk with your health care provider about your target blood pressure readings. Have your blood pressure checked: Every 3-5 years if you are 7-19 years of age. Every year if you are 46 years old or older. If you are between the ages of 107 and 19 and are a current or former smoker, ask your health care provider if you should have a one-time screening for abdominal aortic aneurysm (AAA). Diabetes Have regular diabetes screenings. This checks your fasting blood sugar level. Have the screening done: Once every three years after age 62 if you are at a normal weight and have a low risk for diabetes. More often and at a younger age if you are overweight or have a high risk for diabetes. What should I know about preventing infection? Hepatitis B If you have a higher risk for hepatitis B, you should be screened  for this virus. Talk with your health care provider to find out if you are at risk forhepatitis B infection. Hepatitis C Blood testing is recommended for: Everyone born from [birth year] through 1967. Anyone with known risk factors for hepatitis C. Sexually transmitted infections (STIs) You should be screened each year for STIs, including gonorrhea and chlamydia, if: You are sexually active and are younger than 67 years of age. You are older than 67 years of age and your health care provider tells you that you are at risk for this type of  infection. Your sexual activity has changed since you were last screened, and you are at increased risk for chlamydia or gonorrhea. Ask your health care provider if you are at risk. Ask your health care provider about whether you are at high risk for HIV. Your health care provider may recommend a prescription medicine to help prevent HIV infection. If you choose to take medicine to prevent HIV, you should first get tested for HIV. You should then be tested every 3 months for as long as you are taking the medicine. Follow these instructions at home: Lifestyle Do not use any products that contain nicotine or tobacco, such as cigarettes, e-cigarettes, and chewing tobacco. If you need help quitting, ask your health care provider. Do not use street drugs. Do not share needles. Ask your health care provider for help if you need support or information about quitting drugs. Alcohol use Do not drink alcohol if your health care provider tells you not to drink. If you drink alcohol: Limit how much you have to 0-2 drinks a day. Be aware of how much alcohol is in your drink. In the [place] one drink equals one 12 oz bottle of beer (355 mL), one 5 oz glass of wine (148 mL), or one 1 oz glass of hard liquor (44 mL). General instructions Schedule regular health, dental, and eye exams. Stay current with your vaccines. Tell your health care provider if: You often feel depressed. You have ever been abused or do not feel safe at home. Summary Adopting a healthy lifestyle and getting preventive care are important in promoting health and wellness. Follow your health care provider's instructions about healthy diet, exercising, and getting tested or screened for diseases. Follow your health care provider's instructions on monitoring your cholesterol and blood pressure. This information is not intended to replace advice given to you by your health care provider. Make sure you discuss any questions you have with your  healthcare provider. Document Revised: 07/26/2018 Document Reviewed: 07/26/2018 Elsevier Patient Education  2022 [employer].

## 2021-04-15 ENCOUNTER — Emergency Department (INDEPENDENT_AMBULATORY_CARE_PROVIDER_SITE_OTHER)
Admission: EM | Admit: 2021-04-15 | Discharge: 2021-04-15 | Disposition: A | Discharge status: Home / Self Care | Payer: Medicare Other | Source: Home / Self Care

## 2021-04-15 ENCOUNTER — Encounter: Payer: Self-pay | Admitting: *Deleted

## 2021-04-15 ENCOUNTER — Other Ambulatory Visit: Payer: Self-pay

## 2021-04-15 DIAGNOSIS — R220 Localized swelling, mass and lump, head: Secondary | ICD-10-CM

## 2021-04-15 DIAGNOSIS — T7840XA Allergy, unspecified, initial encounter: Secondary | ICD-10-CM

## 2021-04-15 MED ORDER — PREDNISONE 20 MG PO TABS: ORAL_TABLET | ORAL | 0 refills | Status: DC

## 2021-04-15 MED ORDER — METHYLPREDNISOLONE SODIUM SUCC 125 MG IJ SOLR
125.0000 mg | Freq: Once | INTRAMUSCULAR | Status: AC
  Administered 2021-04-15: 125 mg via INTRAMUSCULAR

## 2021-04-15 NOTE — Discharge Instructions (Addendum)
Advised/instructed patient to take medication as directed with food to completion.  Instructed patient to start oral Prednisone burst tomorrow morning, Thursday, 04/16/2021.  Encouraged patient to increase daily water intake while taking this medication.

## 2021-04-15 NOTE — ED Provider Notes (Signed)
[facility]    CSN: [id number] Arrival date & time: 04/15/21  1000      History   Chief Complaint Chief Complaint  Patient presents with   Oral Swelling    HPI Tanner Young is a 65 y.o. male.   HPI 65 year old male presents with lip swelling x1 day.  Patient's wife is on FaceTime during this office exam today and reports right upper lip swelling most notable since eating mandrin chicken at mall yesterday afternoon~4 PM.  Past Medical History:  Diagnosis Date   Arthritis    Chronic back pain    Chronic kidney disease    Cough    Diabetes mellitus type II, controlled ([place])    Dry skin    Floaters in visual field    GERD (gastroesophageal reflux disease)    Glaucoma, both eyes    Hay fever    Heartburn    Hyperlipemia    Hypertension    Itching    Joint pain    Kidney disease    Lactose intolerance    Leg cramp    Low back pain    Multiple food allergies    Muscle pain    Neuropathy    rt leg from DDD   Prostate cancer ([place])    Rash    Sleep apnea    uses a cpap   Stiff neck    Swelling of lower extremity    Trouble in sleeping    Vision changes    Vitamin D deficiency    Wears glasses    Wears partial dentures    top mid partial-flipper    Patient Active Problem List   Diagnosis Date Noted   Malignant neoplasm of prostate ([place]) 10/28/2020   Elevated PSA 06/23/2020   HLD (hyperlipidemia) 12/19/2018   GERD (gastroesophageal reflux disease) 12/19/2018   Bradycardia 09/13/2018   Type 2 diabetes mellitus without complication, without long-term current use of insulin ([place]) 01/30/2018   Essential hypertension 01/20/2018   Chronic kidney disease 01/20/2018   Urinary hesitancy 01/20/2018   Osteoarthritis 01/20/2018   OSA on CPAP 01/20/2018   Open-angle glaucoma of both eyes, indeterminate stage 05/23/2017    Past Surgical History:  Procedure Laterality Date   COLONOSCOPY     DENTAL SURGERY     extractions   EYE SURGERY     ORIF  PATELLA Right 01/24/2013   Procedure: OPEN REDUCTION INTERNAL (ORIF) FIXATION PATELLA;  Surgeon: Kerin Salen, MD;  Location: [facility];  Service: Orthopedics;  Laterality: Right;  orif right patella,patellectomy    PROSTATECTOMY  02/03/2021   [facility]       Home Medications    Prior to Admission medications   Medication Sig Start Date End Date Taking? Authorizing Provider  amLODipine (NORVASC) 5 MG tablet Take 5 mg by mouth daily.   Yes [provider]  benazepril (LOTENSIN) 40 MG tablet Take 40 mg by mouth daily.   Yes [provider]  blood glucose meter kit and supplies KIT Dispense based on patient and insurance preference. Use daily as directed to monitor blood sugar. (FOR ICD-9 250.00, 250.01). 01/30/18  Yes Zigmund Daniel, [place], DO  brimonidine (ALPHAGAN) 0.15 % ophthalmic solution Place 1 drop into both eyes 2 (two) times daily at 10 AM and 5 PM.   Yes [provider]  cetirizine (ZYRTEC) 10 MG tablet Take 10 mg by mouth daily. As needed.   Yes [provider]  cholecalciferol (VITAMIN D) 1000 UNITS  tablet Take 1,000 Units by mouth daily.   Yes [provider]  desonide (DESOWEN) 0.05 % cream Apply topically 2 (two) times daily.   Yes [provider]  dorzolamide-timolol (COSOPT) 22.3-6.8 MG/ML ophthalmic solution Place 1 drop into both eyes 2 (two) times daily.    Yes [provider]  Easy Touch Lancets 26G MISC Use as directed daily 04/09/19  Yes [name], DO  empagliflozin (JARDIANCE) 25 MG TABS tablet Take 0.5 tablets by mouth daily. 08/22/20  Yes [provider]  fluocinonide (LIDEX) 0.05 % external solution APPLY SMALL AMOUNT TO AFFECTED AREA EVERY 7 DAYS AS NEEDED FOR SCALP [facility] 09/02/20  Yes [provider]  fluticasone (FLONASE) 50 MCG/ACT nasal spray INSTILL 1 SPRAY IN EACH NOSTRIL TWICE A DAY 06/19/20  Yes [provider]  gabapentin (NEURONTIN) 300 MG capsule Take 300 mg by  mouth at bedtime. Twice a day.   Yes [provider]  gabapentin (NEURONTIN) 300 MG capsule TAKE TWO CAPSULES BY MOUTH AT BEDTIME AND TAKE ONE CAPSULE TWICE A DAY 12/18/20  Yes [provider]  glucose blood (PRECISION XTRA TEST STRIPS) test strip Use as directed daily. 04/13/19  Yes [name], DO  ketoconazole (NIZORAL) 2 % cream Apply 1 application topically daily.   Yes [provider]  Latanoprostene Bunod 0.024 % SOLN INSTILL 1 DROP IN BOTH EYES AT BEDTIME (APPROVED) 06/09/20  Yes [provider]  metFORMIN (GLUCOPHAGE-XR) 500 MG 24 hr tablet TAKE 1 TABLET(500 MG) BY MOUTH DAILY WITH BREAKFAST 04/24/19  Yes [name], DO  nystatin-triamcinolone ointment (MYCOLOG) APPLY MODERATE AMOUNT TO AFFECTED AREA TWICE A DAY 06/19/20  Yes [provider]  omeprazole (PRILOSEC) 20 MG capsule Take 20 mg by mouth at bedtime.   Yes [provider]  predniSONE (DELTASONE) 20 MG tablet Take 3 tabs PO x 5 days. 04/15/21  Yes Eliezer Lofts, FNP  PRESCRIPTION MEDICATION Apply 1 application topically daily. Cream to feet    Yes [provider]  PRESCRIPTION MEDICATION Apply 1 application topically daily. Cream to back    Yes [provider]  simvastatin (ZOCOR) 20 MG tablet Take 10 mg by mouth at bedtime.   Yes [provider]  terbinafine (LAMISIL) 1 % cream Apply 1 application topically 2 (two) times daily.   Yes [provider]  triamcinolone ointment (KENALOG) 0.1 % Apply 1 application topically 2 (two) times daily.   Yes [provider]  triamterene-hydrochlorothiazide (MAXZIDE) 75-50 MG per tablet Take 0.5 tablets by mouth daily.   Yes [provider]  sildenafil (VIAGRA) 100 MG tablet Take 100 mg by mouth daily as needed for erectile dysfunction. Patient not taking: No sig reported    [provider]    Family History Family History  Problem Relation Age of Onset   Arthritis Mother     Hearing loss Mother    Hyperlipidemia Mother    Hypertension Mother    Diabetes Mother    Cancer Father    Hypertension Father    Diabetes Brother    Hyperlipidemia Brother    Hypertension Brother    Breast cancer Neg Hx    Prostate cancer Neg Hx    Colon cancer Neg Hx    Pancreatic cancer Neg Hx     Social History Social History   Tobacco Use   Smoking status: Never   Smokeless tobacco: Never  Vaping Use   Vaping Use: Never used  Substance Use Topics   Alcohol use: No  Drug use: No     Allergies   Shellfish allergy and Lisinopril   Review of Systems Review of Systems  HENT:         Oral swelling, right-sided upper lip swelling x 1 day    Physical Exam Triage Vital Signs ED Triage Vitals  Enc Vitals Group     BP 04/15/21 1017 112/73     Pulse Rate 04/15/21 1017 69     Resp 04/15/21 1017 18     Temp 04/15/21 1017 98.8 F (37.1 C)     Temp Source 04/15/21 1017 Oral     SpO2 04/15/21 1017 98 %     Weight 04/15/21 1012 245 lb (111.1 kg)     Height 04/15/21 1012 $RemoveBefor'5\' 10"'gOfNvOvHFqQc$  (1.778 m)     Head Circumference --      Peak Flow --      Pain Score 04/15/21 1012 0     Pain Loc --      Pain Edu? --      Excl. in [place]? --    No data found.  Updated Vital Signs BP 112/73 (BP Location: Right Arm)   Pulse 69   Temp 98.8 F (37.1 C) (Oral)   Resp 18   Ht $R'5\' 10"'mF$  (1.778 m)   Wt 245 lb (111.1 kg)   SpO2 98%   BMI 35.15 kg/m      Physical Exam Vitals and nursing note reviewed.  Constitutional:      General: He is not in acute distress.    Appearance: Normal appearance. He is not ill-appearing.  HENT:     Head: Normocephalic and atraumatic.     Right Ear: Tympanic membrane, ear canal and external ear normal.     Left Ear: Tympanic membrane, ear canal and external ear normal.     Mouth/Throat:     Lips: Pink.     Mouth: Mucous membranes are dry.     Pharynx: Oropharynx is clear. Uvula midline. No pharyngeal swelling, oropharyngeal exudate, posterior  oropharyngeal erythema or uvula swelling.     Comments: Upper lip right sided-moderate soft tissue swelling noted Eyes:     Extraocular Movements: Extraocular movements intact.     Conjunctiva/sclera: Conjunctivae normal.     Pupils: Pupils are equal, round, and reactive to light.  Cardiovascular:     Rate and Rhythm: Normal rate and regular rhythm.     Pulses: Normal pulses.     Heart sounds: Normal heart sounds.  Pulmonary:     Effort: Pulmonary effort is normal.     Breath sounds: Normal breath sounds. No wheezing, rhonchi or rales.  Musculoskeletal:        General: Normal range of motion.     Cervical back: Normal range of motion and neck supple. No rigidity or tenderness.  Lymphadenopathy:     Cervical: No cervical adenopathy.  Skin:    General: Skin is warm and dry.  Neurological:     General: No focal deficit present.     Mental Status: He is alert and oriented to person, place, and time. Mental status is at baseline.  Psychiatric:        Mood and Affect: Mood normal.        Behavior: Behavior normal.        Thought Content: Thought content normal.     UC Treatments / Results  Labs (all labs ordered are listed, but only abnormal results are displayed) Labs Reviewed - No data to display  EKG  Radiology No results found.  Procedures Procedures (including critical care time)  Medications Ordered in UC Medications  methylPREDNISolone sodium succinate (SOLU-MEDROL) 125 mg/2 mL injection 125 mg (125 mg Intramuscular Given 04/15/21 1045)    Initial Impression / Assessment and Plan / UC Course  I have reviewed the triage vital signs and the nursing notes.  Pertinent labs & imaging results that were available during my care of the patient were reviewed by me and considered in my medical decision making (see chart for details).     MDM: 1.  Allergic reaction, initial encounter-IM Solu-Medrol 125 given in clinic once prior to discharge today; 2.  Swelling of upper  lip-Rx'd Prednisone burst. Advised/instructed patient to take medication as directed with food to completion.  Instructed patient to start oral Prednisone burst tomorrow morning, Thursday, 04/16/2021.  Encouraged patient to increase daily water intake while taking this medication. Final Clinical Impressions(s) / UC Diagnoses   Final diagnoses:  Swelling of upper lip  Allergic reaction, initial encounter     Discharge Instructions      Advised/instructed patient to take medication as directed with food to completion.  Instructed patient to start oral Prednisone burst tomorrow morning, Thursday, 04/16/2021.  Encouraged patient to increase daily water intake while taking this medication.     ED Prescriptions     Medication Sig Dispense Auth. Provider   predniSONE (DELTASONE) 20 MG tablet Take 3 tabs PO x 5 days. 15 tablet Eliezer Lofts, FNP      PDMP not reviewed this encounter.   Eliezer Lofts, [place] 04/15/21 1109

## 2021-04-15 NOTE — ED Triage Notes (Signed)
Pt c/o lip swelling x 1 day. Denies any other s/s.

## 2021-04-27 NOTE — Patient Instructions (Addendum)

## 2021-04-27 NOTE — Progress Notes (Addendum)
PATIENT: Tanner Young DOB: [birth year]  REASON FOR VISIT: follow up HISTORY FROM: patient  Virtual Visit via Telephone Note  I connected with Tanner Young on 04/28/21 at  9:00 AM EDT by telephone and verified that I am speaking with the correct person using two identifiers.   I discussed the limitations, risks, security and privacy concerns of performing an evaluation and management service by telephone and the availability of in person appointments. I also discussed with the patient that there may be a patient responsible charge related to this service. The patient expressed understanding and agreed to proceed.   History of Present Illness:  04/28/21 ALL: Tanner Young is a 65 y.o. male here today for follow up for OSA on CPAP. He has been doing fairly well with CPAP therapy. He admits to lower compliance due to recent diagnosis of prostate cancer. He is status post robotic prostatectomy 02/03/2021 with [facility]. He is participating in pelvic floor rehab. He has follow up next month. He is getting back into a more consistent schedule and using CPAP most every night. He denies concerns with machine or supplies.     History (copied from Dr Guadelupe Sabin previous note)  Tanner Young is a 65 year old right-handed gentleman with an underlying medical history of hypertension, reflux disease, chronic low back pain, arthritis, glaucoma, and obesity, who presents for follow-up consultation of his obstructive sleep apnea, on CPAP therapy. The patient is unaccompanied today, his wife is on speaker phone. I last saw him on 04/25/2019, at which time he was fully compliant with CPAP and doing well.  He was working on weight loss.  Since he had lost some weight, I suggested we reduce his treatment pressure from 16 cm to 14 cm at the time. He was advised to follow-up routinely in 1 year.   Today, 04/28/2020: I reviewed his CPAP compliance data from 03/25/2020 through 04/23/2020, which is a total of 30 days, during  which time he used his machine every night with percent use days greater than 4 hours at 100%, indicating superb compliance, average usage of 6 hours and 7 minutes, residual AHI borderline at 4.5/h, pressure at 14 cm with EPR of 3, 95th percentile of leak at 13.9 L/min.  He reports generally doing well.  He has had some weight fluctuation, some weight gain compared to last year.  He is compliant with treatment but is wondering if he should get reevaluated for the need of CPAP therapy.  His wife is on the speaker phone.  He is up-to-date with his supplies, he uses a full facemask, has changed his supplies on a regular basis.  He has had no new changes in his medication list or medical history.  Had a checkup with his primary care physician for his yearly physical.   Observations/Objective:  Generalized: Well developed, in no acute distress  Mentation: Alert oriented to time, place, history taking. Follows all commands speech and language fluent   Assessment and Plan:  65 y.o. year old male  has a past medical history of Arthritis, Chronic back pain, Chronic kidney disease, Cough, Diabetes mellitus type II, controlled ([place]), Dry skin, Floaters in visual field, GERD (gastroesophageal reflux disease), Glaucoma, both eyes, Hay fever, Heartburn, Hyperlipemia, Hypertension, Itching, Joint pain, Kidney disease, Lactose intolerance, Leg cramp, Low back pain, Multiple food allergies, Muscle pain, Neuropathy, Prostate cancer ([place]), Rash, Sleep apnea, Stiff neck, Swelling of lower extremity, Trouble in sleeping, Vision changes, Vitamin D deficiency, Wears glasses, and Wears partial dentures.  here with    ICD-10-CM   1. OSA on CPAP  G47.33 For home use only DME continuous positive airway pressure (CPAP)   Z99.89       Tanner Young continues to do well with CPAP therapy. Compliance report shows acceptable usage. He was encouraged to continue using CPAP nightly and greater than 4 hours each night. He will continue to  monitor for elevated leak at home. May consider mask refitting if needed. AHI is acceptable at this time. We will update supply orders. He will continue close follow up with [facility]rology at [facility] and PCP. Healthy lifestyle habits encouraged. He will return for follow up in 1 year, sooner if needed.   CPAP set up 10/2017.  Orders Placed This Encounter  Procedures   For home use only DME continuous positive airway pressure (CPAP)    Supplies    Order Specific Question:   Length of Need    Answer:   Lifetime    Order Specific Question:   Patient has OSA or probable OSA    Answer:   Yes    Order Specific Question:   Is the patient currently using CPAP in the home    Answer:   Yes    Order Specific Question:   Settings    Answer:   Other see comments    Order Specific Question:   CPAP supplies needed    Answer:   Mask, headgear, cushions, filters, heated tubing and water chamber     No orders of the defined types were placed in this encounter.    Follow Up Instructions:  I discussed the assessment and treatment plan with the patient. The patient was provided an opportunity to ask questions and all were answered. The patient agreed with the plan and demonstrated an understanding of the instructions.   The patient was advised to call back or seek an in-person evaluation if the symptoms worsen or if the condition fails to improve as anticipated.  I provided 15 minutes of non-face-to-face time during this encounter. Patient located at their place of residence during [place] visit. Provider is in the office.    Debbora Presto, NP   I reviewed the above note and documentation by the Nurse Practitioner and agree with the history, exam, assessment and plan as outlined above. I was available for consultation. Star Age, MD, PhD [facility])'

## 2021-04-28 ENCOUNTER — Encounter: Payer: Self-pay | Admitting: Family Medicine

## 2021-04-28 ENCOUNTER — Telehealth (INDEPENDENT_AMBULATORY_CARE_PROVIDER_SITE_OTHER): Payer: Medicare Other | Admitting: Family Medicine

## 2021-04-28 DIAGNOSIS — G4733 Obstructive sleep apnea (adult) (pediatric): Secondary | ICD-10-CM

## 2021-04-28 DIAGNOSIS — Z9989 Dependence on other enabling machines and devices: Secondary | ICD-10-CM | POA: Diagnosis not present

## 2021-04-28 NOTE — Progress Notes (Signed)
CM sent to [facility]  

## 2021-08-18 ENCOUNTER — Ambulatory Visit (INDEPENDENT_AMBULATORY_CARE_PROVIDER_SITE_OTHER): Payer: Medicare Other | Admitting: Sports Medicine

## 2021-08-18 ENCOUNTER — Other Ambulatory Visit: Payer: Self-pay

## 2021-08-18 DIAGNOSIS — H6121 Impacted cerumen, right ear: Secondary | ICD-10-CM | POA: Diagnosis not present

## 2021-08-18 NOTE — Progress Notes (Signed)
° ° °  Procedures performed today:    Indication: Cerumen impaction of the right ear(s) Medical necessity statement: On physical examination, cerumen impairs clinically significant portions of the external auditory canal, and tympanic membrane. Noted obstructive, copious cerumen that cannot be removed without magnification and instrumentations requiring physician skills Consent: Discussed benefits and risks of procedure and verbal consent obtained Procedure: Patient was prepped for the procedure. Utilized an otoscope to assess and take note of the ear canal, the tympanic membrane, and the presence, amount, and placement of the cerumen. Gentle water irrigation and a plastic curette was utilized to remove cerumen.  Post procedure examination: shows cerumen was completely removed. Patient tolerated procedure well. The patient is made aware that they may experience temporary vertigo, temporary hearing loss, and temporary discomfort. If these symptom last for more than 24 hours to call the clinic or proceed to the ED.  Independent interpretation of notes and tests performed by another provider:   None.  Brief History, Exam, Impression, and Recommendations:    Hearing loss due to cerumen impaction, right This is a pleasant 66 year old male, he has been complaining of increasing difficulty with hearing, as well as a ringing sensation in the right ear. Exam is benign with the exception of tympanic membrane occluding the entire right-sided canal, irrigated clean and a curette used as well, return to see me if hearing does not significantly improve in a month, if it does not improve we will consider steroids, +/- referral to audiology for presbycusis.    ___________________________________________ Gwen Her. Dianah Field, M.D., ABFM., CAQSM. Primary Care and [place] Instructor of [place] of [facility] of Medicine

## 2021-08-18 NOTE — Assessment & Plan Note (Addendum)
This is a pleasant 66 year old male, he has been complaining of increasing difficulty with hearing, as well as a ringing sensation in the right ear. Exam is benign with the exception of tympanic membrane occluding the entire right-sided canal, irrigated clean and a curette used as well, return to see me if hearing does not significantly improve in a month, if it does not improve we will consider steroids, +/- referral to audiology for presbycusis.

## 2021-12-03 ENCOUNTER — Ambulatory Visit (INDEPENDENT_AMBULATORY_CARE_PROVIDER_SITE_OTHER): Payer: Medicare Other | Admitting: Family Medicine

## 2021-12-03 ENCOUNTER — Encounter: Payer: Self-pay | Admitting: Family Medicine

## 2021-12-03 VITALS — BP 108/67 | HR 54 | Ht 70.0 in | Wt 265.0 lb

## 2021-12-03 DIAGNOSIS — F419 Anxiety disorder, unspecified: Secondary | ICD-10-CM | POA: Diagnosis not present

## 2021-12-03 DIAGNOSIS — E119 Type 2 diabetes mellitus without complications: Secondary | ICD-10-CM

## 2021-12-03 DIAGNOSIS — T753XXA Motion sickness, initial encounter: Secondary | ICD-10-CM | POA: Diagnosis not present

## 2021-12-03 MED ORDER — DIAZEPAM 10 MG PO TABS: 10.0000 mg | ORAL_TABLET | Freq: Once | ORAL | 0 refills | Status: DC

## 2021-12-03 MED ORDER — SCOPOLAMINE 1 MG/3DAYS TD PT72: 1.0000 | MEDICATED_PATCH | TRANSDERMAL | 0 refills | Status: DC

## 2021-12-03 NOTE — Assessment & Plan Note (Addendum)
Reports of increased anxiety, a lot of this surrounds his health problems including prostate cancer treatment..  Referral placed for therapist. ? ?Separately we will add single dose of diazepam to take prior to dental procedure. ?

## 2021-12-03 NOTE — Progress Notes (Signed)
?Tanner Young - 66 y.o. male MRN [id number]  Date of birth: Sep 07, 1955 ? ?Subjective ?Chief Complaint  ?Patient presents with  ? Depression  ? Diabetes  ? Hypertension  ? ? ?HPI ?Tanner Young is a 66 y.o. male here today to discuss anxiety and upcoming trip.  Receives the majority of his care through the [place].  He has had labs and management of his diabetes and HTN through them.   ? ? ?Going on a cruise next month and requesting something to help with sea sickness. He has never been on a cruise before.  ? ?He reports some increased anxiety. He would like a referral to a therapist to help with this.   ? ?He is also requesting something to take for anxiety prior to his upcoming dental procedure.   ? ?ROS:  A comprehensive ROS was completed and negative except as noted per HPI ? ?Allergies  ?Allergen Reactions  ? Shellfish Allergy Itching and Nausea And Vomiting  ?  Shrimp ?  ? Lisinopril Cough  ? ? ?Past Medical History:  ?Diagnosis Date  ? Arthritis   ? Chronic back pain   ? Chronic kidney disease   ? Cough   ? Diabetes mellitus type II, controlled ([place])   ? Dry skin   ? Floaters in visual field   ? GERD (gastroesophageal reflux disease)   ? Glaucoma, both eyes   ? Hay fever   ? Heartburn   ? Hyperlipemia   ? Hypertension   ? Itching   ? Joint pain   ? Kidney disease   ? Lactose intolerance   ? Leg cramp   ? Low back pain   ? Multiple food allergies   ? Muscle pain   ? Neuropathy   ? rt leg from DDD  ? Prostate cancer ([place])   ? Rash   ? Sleep apnea   ? uses a cpap  ? Stiff neck   ? Swelling of lower extremity   ? Trouble in sleeping   ? Vision changes   ? Vitamin D deficiency   ? Wears glasses   ? Wears partial dentures   ? top mid partial-flipper  ? ? ?Past Surgical History:  ?Procedure Laterality Date  ? COLONOSCOPY    ? DENTAL SURGERY    ? extractions  ? EYE SURGERY    ? ORIF PATELLA Right 01/24/2013  ? Procedure: OPEN REDUCTION INTERNAL (ORIF) FIXATION PATELLA;  Surgeon: Kerin Salen, MD;  Location: [place];  Service: Orthopedics;  Laterality: Right;  orif right patella,patellectomy   ? PROSTATECTOMY  02/03/2021  ? [facility]  ? ? ?Social History  ? ?Socioeconomic History  ? Marital status: Married  ?  Spouse name: [name]  ? Number of children: 5  ? Years of education: 19  ? Highest education level: Some college, no degree  ?Occupational History  ?  Comment: retired  ?Tobacco Use  ? Smoking status: Never  ? Smokeless tobacco: Never  ?Vaping Use  ? Vaping Use: Never used  ?Substance and Sexual Activity  ? Alcohol use: No  ? Drug use: No  ? Sexual activity: Not Currently  ?Other Topics Concern  ? Not on file  ?Social History Narrative  ? Lives with his wife. He enjoys walking everyday.  ? ?Social Determinants of Health  ? ?Financial Resource Strain: Low Risk   ? Difficulty of Paying Living Expenses: Not hard at all  ?Food Insecurity: No Food Insecurity  ? Worried  About Running Out of Food in the Last Year: Never true  ? Ran Out of Food in the Last Year: Never true  ?Transportation Needs: No Transportation Needs  ? Lack of Transportation (Medical): No  ? Lack of Transportation (Non-Medical): No  ?Physical Activity: Inactive  ? Days of Exercise per Week: 0 days  ? Minutes of Exercise per Session: 0 min  ?Stress: No Stress Concern Present  ? Feeling of Stress : Not at all  ?Social Connections: Moderately Isolated  ? Frequency of Communication with Friends and Family: Once a week  ? Frequency of Social Gatherings with Friends and Family: Once a week  ? Attends Religious Services: More than 4 times per year  ? Active Member of Clubs or Organizations: No  ? Attends Archivist Meetings: Never  ? Marital Status: Married  ? ? ?Family History  ?Problem Relation Age of Onset  ? Arthritis Mother   ? Hearing loss Mother   ? Hyperlipidemia Mother   ? Hypertension Mother   ? Diabetes Mother   ? Cancer Father   ? Hypertension Father   ? Diabetes Brother   ? Hyperlipidemia Brother   ? Hypertension Brother   ?  Breast cancer Neg Hx   ? Prostate cancer Neg Hx   ? Colon cancer Neg Hx   ? Pancreatic cancer Neg Hx   ? ? ?Health Maintenance  ?Topic Date Due  ? HEMOGLOBIN A1C  12/07/2019  ? OPHTHALMOLOGY EXAM  12/03/2021 (Originally 05/14/2020)  ? COVID-19 Vaccine (5 - Booster for Moderna series) 12/19/2021 (Originally 10/29/2020)  ? FOOT EXAM  04/13/2022 (Originally 06/07/2020)  ? TETANUS/TDAP  04/13/2022 (Originally 08/22/2018)  ? Hepatitis C Screening  04/13/2022 (Originally 08/06/1972)  ? Pneumonia Vaccine 61+ Years old (1 - PCV) 12/04/2022 (Originally 08/06/1960)  ? INFLUENZA VACCINE  03/16/2022  ? COLONOSCOPY (Pts 45-3yr Insurance coverage will need to be confirmed)  09/21/2028  ? Zoster Vaccines- Shingrix  Completed  ? HPV VACCINES  Aged Out  ? ? ? ?----------------------------------------------------------------------------------------------------------------------------------------------------------------------------------------------------------------- ?Physical Exam ?BP 108/67 (BP Location: Left Arm, Patient Position: Sitting, Cuff Size: Large)   Pulse (!) 54   Ht '5\' 10"'$  (1.778 m)   Wt 265 lb (120.2 kg)   SpO2 98%   BMI 38.02 kg/m?  ? ?Physical Exam ?Constitutional:   ?   Appearance: Normal appearance.  ?Neurological:  ?   Mental Status: He is alert.  ?Psychiatric:     ?   Mood and Affect: Mood normal.     ?   Behavior: Behavior normal.  ? ? ?------------------------------------------------------------------------------------------------------------------------------------------------------------------------------------------------------------------- ?Assessment and Plan ? ?Type 2 diabetes mellitus without complication, without long-term current use of insulin ([place] ?[place]  Reports blood sugars have been stable.  We [place] [facility] [place]. ? ?Anxiety ?Reports of increased anxiety, a lot of this surrounds his health problems including prostate cancer  treatment..  Referral placed for therapist. ? ?Separately we will add single dose of diazepam to take prior to dental procedure. ? ?Seasickness ?Adding scopolamine patch as he is on cruise for prevention of seasickness. ? ? ?Meds ordered this encounter  ?Medications  ? scopolamine (TRANSDERM-SCOP) 1 MG/3DAYS  ?  Sig: Place 1 patch (1.5 mg total) onto the skin every 3 (three) days.  ?  Dispense:  4 patch  ?  Refill:  0  ? diazepam (VALIUM) 10 MG tablet  ?  Sig: Take 1 tablet (10 mg total) by mouth once for  1 dose. Take 1 hour prior to dental procedure.  ?  Dispense:  1 tablet  ?  Refill:  0  ? ? ?No follow-ups on file. ? ? ? ?This visit occurred during the SARS-CoV-2 public health emergency.  Safety protocols were in place, including screening questions prior to the visit, additional usage of staff PPE, and extensive cleaning of exam room while observing appropriate contact time as indicated for disinfecting solutions.  ? ?

## 2021-12-03 NOTE — Assessment & Plan Note (Signed)
Currently managed by PCP through the [place].  Reports blood sugars have been stable.  We will request records from [place] on recent lab work. ?

## 2021-12-03 NOTE — Assessment & Plan Note (Signed)
Adding scopolamine patch as he is on cruise for prevention of seasickness. ?

## 2021-12-16 ENCOUNTER — Telehealth: Payer: Self-pay

## 2021-12-16 NOTE — Telephone Encounter (Signed)
Initiated Prior authorization for: Scopolamine '1MG'$ /3DAYS 72 hr patches ?Via: Covermymeds ?Case/Key:[id number] ?Status: Pending as of 12/16/21 ?Reason: ?Notified Pt via: Mychart  ?

## 2022-01-25 ENCOUNTER — Ambulatory Visit (INDEPENDENT_AMBULATORY_CARE_PROVIDER_SITE_OTHER): Payer: Medicare Other | Admitting: Clinical

## 2022-01-25 DIAGNOSIS — F4322 Adjustment disorder with anxiety: Secondary | ICD-10-CM | POA: Diagnosis not present

## 2022-01-25 NOTE — Progress Notes (Signed)
Time: 12:00pm-12:58pm CPT Code: 41740C-14 Diagnosis Code: F43.22   Capers was seen remotely using secure video conferencing. He was in his home in [place] and the therapist was in her office at the time of the appointment. Session focused on completing an intake for therapy and initiating his treatment plan. Treatment plan will be completed, including identifying goals for therapy and target dates, in his next session. He is scheduled to be seen again in one month.  Intake Presenting Problem Arnoldo shared that he was diagnosed with prostate cancer in February of 2022. He underwent surgery in June of 2022. This was reported to have gone well. He has participated in a support group for cancer survivors at [facility] once monthly. He reported that he has completed chemo and attends follow-up appointments every three months. He reported that he has felt overwhelmed hearing the accounts of fellow group members. He also experiences a long-standing back injury. He is not officially in remission, and is monitoring his status with medical providers. Symptoms Social withdrawal, feelings of overwhelm, "what-if" thoughts, difficulty relaxing when scans are approaching, feelings of restlessness History of Problem  Jacori would like to process emotions related to his cancer diagnosis in February of 2022. He shared that he has experienced episodes of anxiety prior to this occasionally. Recent Trigger  Sahaj was referred by his PCP, Dr. [name], after he shared feelings of overwhelm in relation to his cancer support group.  Marital and Family Information  Eliud lives with his wife of 28 years. He has five adult children, the youngest of whom is 33 and the oldest of whom is 63.   Present family concerns/problems: Dace shared that his adult daughter has had some psychiatric difficulties. All of his children live independently and maintain employment. He also has a one-year-old grandchild born to his 68  year-old daughter.   Strengths/resources in the family/friends:  Ramirez described a supportive relationship with his wife, and he sometimes feels concerned for her stress levels. He has several close friendships and described them as a tight knit group.  Marital/sexual history patterns:   Family of Origin Star's mother is alive but his father has passed. He is the oldest of three, and has a brother and sister. He has several nephews and two nieces. [place] and joined the [employer] at 66.   Problems in family of origin: Nikan's father passed of brain cancer at 68 years of age. He was healthy until then. Family background / ethnic factors: Alexandra has lived in [place] his whole life, but traveled during his time in the service. He was raised in the [place].  No needs/concerns related to ethnicity reported when asked: No  Education/Vocation  Interpersonal concerns/problems: None.  Personal strengths:  Ability to get along with others Military/work problems/concerns: Rilan is retired. 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 Prior to that time, he had been working for the post [employer]. Leisure Activities/Daily Functioning  Legal Status  No Legal Problems: No Medical/Nutritional Concerns  Prostate cancer diagnosed Feb of 2022, back problems, kidney problems. Harlan was diagnosed with kidney problems in 1999. He broke his kneecap several years prior to the initial intake. He has also undergone extensive dental work.  Comments:   Substance use/abuse/dependence: Occasional glass of wine, otherwise no substances  Comments:   Religion/Spirituality: Revan described himself as Darrick Meigs in the sense of wanting to treat others well and follow the golden rule. However, he  reported that he does not strictly adhere to anyone religious dogma.  General Behavior: WNL Attire: WNL Gait: not observed-telehealth Motor Activity: WNL Stream  of Thought - Productivity: WNL Stream of thought - Progression: WNL Stream of thought - Language:  WNL Emotional tone and reactions - Mood: WNL  Emotional tone and reactions - Affect: WNL Mental trend/Content of thoughts - Perception: WNL  Mental trend/Content of thoughts - Orientation: WNL Mental trend/Content of thoughts - Memory: WNL Mental trend/Content of thoughts - General knowledge: WNL  Insight: Good Judgment: Good Intelligence: Average Mental Status Comment: Good Diagnostic Summary: F43.22, adjustment disorder with anxiety  Treatment Plan Client Abilities/Strengths  Javian described himself as willing to complete therapy appointment. He described himself as guarded in nature but willing to try to open up in his sessions.  Client Treatment Preferences:  None. He described himself as open to virtual or in-person therapy.  Client Statement of Needs  Kue is seeking clarity in terms of his own emotional experience of his cancer diagnosis. He has noticed a bigger emotional response to his support group than to others groups he has participated in, and would like to unpack his emotional experience of this. Treatment Level    Symptoms  Problems Addressed  Goals 1. List Goal. Add as many as needed Objective  Target Date:  Frequency:   Progress: 0 Modality:   Related Interventions List interventions-as many as needed Objective  Target Date:  Frequency:   Progress: 0 Modality: individual  Related Interventions Diagnosis Axis none Major depression, Single Episode   Axis none    Conditions For Discharge Achievement of treatment goals and objectives                Myrtie Cruise, PhD

## 2022-02-25 ENCOUNTER — Ambulatory Visit (INDEPENDENT_AMBULATORY_CARE_PROVIDER_SITE_OTHER): Payer: Medicare Other | Admitting: Clinical

## 2022-02-25 DIAGNOSIS — F4322 Adjustment disorder with anxiety: Secondary | ICD-10-CM | POA: Diagnosis not present

## 2022-02-25 NOTE — Progress Notes (Signed)
Time: 2:10pm-3:00pm CPT Code: 07867J-44 Diagnosis Code: F43.22  Tanner Young was seen remotely using secure video conferencing. He was in his home and therapist was in her office at time of appointment. Session started by jointly completing Tanner Young's treatment plan. Tanner Young then provided more background information on family and health-related stressors. He is scheduled to be seen again in two weeks.   Treatment Plan Client Abilities/Strengths  Tanner Young described himself as willing to complete therapy appointment. He described himself as guarded in nature but willing to try to open up in his sessions.  Client Treatment Preferences:  None. He described himself as open to virtual or in-person therapy.  Client Statement of Needs  Tanner Young is seeking clarity in terms of his own emotional experience of his cancer diagnosis. He has noticed a bigger emotional response to his support group than to others groups he has participated in, and would like to unpack his emotional experience of this. Treatment Level  Monthly   Symptoms  Difficulty communicating about emotionally challenging topics, tendency to shut down and internalize Problems Addressed  Goals 1. List Goal. Add as many as needed Objective Tanner Young would like to increase his openness to talking about emoti\onally challenging topics  Target Date: 02/26/2023 Frequency: Monthly  Progress: 0 Modality: Individual  Related Interventions 1. Tanner Young will be provided with opportunities to process his experiences in session 2.  Therapist will help Tanner Young to notice and disengage from maladaptive thoughts nd behaviors using CBT-based strategies 3. Therapist will provide emotion regulation strategies  including meditation, mindfulness, and general self-care 4. Therapist will provide referrals for additional resources as appropriate Objective Tanner Young would like to reduce his anxiety related to support groups he attends regularly, as well as in relation to  day-to-day events  Target Date: 02/26/2023 Frequency: Monthly  Progress: 0 Modality: individual  Related Interventions Diagnosis Axis none Adjustment Disorder with Anxiety, F43.22   Axis none    Conditions For Discharge Achievement of treatment goals and objectives                Tanner Cruise, PhD               Tanner Cruise, PhD

## 2022-03-09 ENCOUNTER — Ambulatory Visit (INDEPENDENT_AMBULATORY_CARE_PROVIDER_SITE_OTHER): Payer: Medicare Other | Admitting: Clinical

## 2022-03-09 DIAGNOSIS — F4322 Adjustment disorder with anxiety: Secondary | ICD-10-CM

## 2022-03-09 NOTE — Progress Notes (Signed)
Time: 9:03 am-9:57 am CPT Code: 02111N-35 Diagnosis Code: F43.22  Tanner Young was seen remotely using secure video conferencing. He was in his home and therapist was in her office at time of appointment. He reflected upon several stressful experiences in recent week, as well as mild anxiety about his upcoming bloodwork to check that he is still in remission. Therapist engaged him a discussion of his coping strategies, and he especially listed reading and brain teasers. He is scheduled to be seen again in one month, and will reach out if he needs to be seen sooner.  Treatment Plan Client Abilities/Strengths  Tanner Young described himself as willing to complete therapy appointment. He described himself as guarded in nature but willing to try to open up in his sessions.  Client Treatment Preferences:  None. He described himself as open to virtual or in-person therapy.  Client Statement of Needs  Tanner Young is seeking clarity in terms of his own emotional experience of his cancer diagnosis. He has noticed a bigger emotional response to his support group than to others groups he has participated in, and would like to unpack his emotional experience of this. Treatment Level  Monthly   Symptoms  Difficulty communicating about emotionally challenging topics, tendency to shut down and internalize Problems Addressed  Goals 1. List Goal. Add as many as needed Objective Tanner Young would like to increase his openness to talking about emoti\onally challenging topics  Target Date: 02/26/2023 Frequency: Monthly  Progress: 0 Modality: Individual  Related Interventions 1. Tanner Young will be provided with opportunities to process his experiences in session 2.  Therapist will help Tanner Young to notice and disengage from maladaptive thoughts nd behaviors using CBT-based strategies 3. Therapist will provide emotion regulation strategies  including meditation, mindfulness, and general self-care 4. Therapist will provide referrals for  additional resources as appropriate Objective Tanner Young would like to reduce his anxiety related to support groups he attends regularly, as well as in relation to day-to-day events  Target Date: 02/26/2023 Frequency: Monthly  Progress: 0 Modality: individual  Related Interventions Diagnosis Axis none Adjustment Disorder with Anxiety, F43.22   Axis none    Conditions For Discharge Achievement of treatment goals and objectives             Tanner Cruise, PhD               Tanner Cruise, PhD

## 2022-04-22 ENCOUNTER — Ambulatory Visit (INDEPENDENT_AMBULATORY_CARE_PROVIDER_SITE_OTHER): Payer: Medicare Other | Admitting: Clinical

## 2022-04-22 DIAGNOSIS — F4322 Adjustment disorder with anxiety: Secondary | ICD-10-CM

## 2022-04-22 NOTE — Progress Notes (Signed)
Time: 10:03 am-10:33 am CPT Code: 78295A-21 Diagnosis Code: F43.22  Tanner Young was seen remotely using secure video conferencing. He was in his home and therapist was in her office at time of appointment. He shared that he has been doing well since his last appointment, and had gotten good news at his most recent scans. He reported continuing challenging in his family, but also shared that he has been able to support his adult children without becoming overly involved. He requested to end session early due to an appointment conflict, and is schedule to be seen again in one month.  Treatment Plan Client Abilities/Strengths  Tanner Young described himself as willing to complete therapy appointment. He described himself as guarded in nature but willing to try to open up in his sessions.  Client Treatment Preferences:  None. He described himself as open to virtual or in-person therapy.  Client Statement of Needs  Tanner Young is seeking clarity in terms of his own emotional experience of his cancer diagnosis. He has noticed a bigger emotional response to his support group than to others groups he has participated in, and would like to unpack his emotional experience of this. Treatment Level  Monthly   Symptoms  Difficulty communicating about emotionally challenging topics, tendency to shut down and internalize Problems Addressed  Goals 1. List Goal. Add as many as needed Objective A[place] would like to increase his openness to talking about emoti\onally challenging topics  Target Date: 02/26/2023 Frequency: Monthly  Progress: 0 Modality: Individual  Related Interventions 1. Tanner Young will be provided with opportunities to process his experiences in session 2.  Therapist will help Tanner Young to notice and disengage from maladaptive thoughts nd behaviors using CBT-based strategies 3. Therapist will provide emotion regulation strategies  including meditation, mindfulness, and general self-care 4. Therapist will  provide referrals for additional resources as appropriate Objective Tanner Young would like to reduce his anxiety related to support groups he attends regularly, as well as in relation to day-to-day events  Target Date: 02/26/2023 Frequency: Monthly  Progress: 0 Modality: individual  Related Interventions Diagnosis Axis none Adjustment Disorder with Anxiety, F43.22   Axis none    Conditions For Discharge Achievement of treatment goals and objectives       Myrtie Cruise, PhD               Myrtie Cruise, PhD

## 2022-05-13 NOTE — Progress Notes (Signed)
PATIENT: Tanner Young DOB: 01-07-1956  REASON FOR VISIT: follow up HISTORY FROM: patient  Chief Complaint  Patient presents with   Follow-up    RM 1 with wife. Last seen 04/28/21. CPAP f/u. Things are going well. No complaints. Feels he has some leaks w/ CPAP out of mask but not often.     HISTORY OF PRESENT ILLNESS:  05/17/22 ALL:  Tanner Young is a 66 y.o. male here today for follow up for OSA on CPAP.  He continues to do well. He is using CPAP nightly for about 5-6 hours. He denies concerns with machine. He has noted more air leaks at home. He is using FFM. He does have facial hair. He usually changes mask about every 3 months. Machine was set up around February/March 2019.     04/28/21 ALL (mychart): Tanner Young is a 66 y.o. male here today for follow up for OSA on CPAP. He has been doing fairly well with CPAP therapy. He admits to lower compliance due to recent diagnosis of prostate cancer. He is status post robotic prostatectomy 02/03/2021 with [facility]. He is participating in pelvic floor rehab. He has follow up next month. He is getting back into a more consistent schedule and using CPAP most every night. He denies concerns with machine or supplies.     History (copied from Dr Guadelupe Sabin previous note)  [name] is a 66 year old right-handed gentleman with an underlying medical history of hypertension, reflux disease, chronic low back pain, arthritis, glaucoma, and obesity, who presents for follow-up consultation of his obstructive sleep apnea, on CPAP therapy. The patient is unaccompanied today, his wife is on speaker phone. I last saw him on 04/25/2019, at which time he was fully compliant with CPAP and doing well.  He was working on weight loss.  Since he had lost some weight, I suggested we reduce his treatment pressure from 16 cm to 14 cm at the time. He was advised to follow-up routinely in 1 year.   Today, 04/28/2020: I reviewed his CPAP compliance data from  03/25/2020 through 04/23/2020, which is a total of 30 days, during which time he used his machine every night with percent use days greater than 4 hours at 100%, indicating superb compliance, average usage of 6 hours and 7 minutes, residual AHI borderline at 4.5/h, pressure at 14 cm with EPR of 3, 95th percentile of leak at 13.9 L/min.  He reports generally doing well.  He has had some weight fluctuation, some weight gain compared to last year.  He is compliant with treatment but is wondering if he should get reevaluated for the need of CPAP therapy.  His wife is on the speaker phone.  He is up-to-date with his supplies, he uses a full facemask, has changed his supplies on a regular basis.  He has had no new changes in his medication list or medical history.  Had a checkup with his primary care physician for his yearly physical.   REVIEW OF SYSTEMS: Out of a complete 14 system review of symptoms, the patient complains only of the following symptoms, none and all other reviewed systems are negative.    ALLERGIES: Allergies  Allergen Reactions   Shellfish Allergy Itching and Nausea And Vomiting    Shrimp    Lisinopril Cough    HOME MEDICATIONS: Outpatient Medications Prior to Visit  Medication Sig Dispense Refill   amLODipine (NORVASC) 5 MG tablet Take 5 mg by mouth daily.  benazepril (LOTENSIN) 40 MG tablet Take 40 mg by mouth daily.     blood glucose meter kit and supplies KIT Dispense based on patient and insurance preference. Use daily as directed to monitor blood sugar. (FOR ICD-9 250.00, 250.01). 1 each 0   brimonidine (ALPHAGAN) 0.15 % ophthalmic solution Place 1 drop into both eyes 2 (two) times daily at 10 AM and 5 PM.     cetirizine (ZYRTEC) 10 MG tablet Take 10 mg by mouth daily. As needed.     cholecalciferol (VITAMIN D) 1000 UNITS tablet Take 1,000 Units by mouth daily.     desonide (DESOWEN) 0.05 % cream Apply topically 2 (two) times daily.     dorzolamide-timolol (COSOPT)  22.3-6.8 MG/ML ophthalmic solution Place 1 drop into both eyes 2 (two) times daily.      Easy Touch Lancets 26G MISC Use as directed daily 100 each 1   empagliflozin (JARDIANCE) 25 MG TABS tablet Take 0.5 tablets by mouth daily.     fluocinonide (LIDEX) 0.05 % external solution APPLY SMALL AMOUNT TO AFFECTED AREA EVERY 7 DAYS AS NEEDED FOR SCALP ITCH     fluticasone (FLONASE) 50 MCG/ACT nasal spray INSTILL 1 SPRAY IN EACH NOSTRIL TWICE A DAY     gabapentin (NEURONTIN) 300 MG capsule Take 600 mg by mouth. Takes 2 in the morning and 2 at bedtime     glucose blood (PRECISION XTRA TEST STRIPS) test strip Use as directed daily. 100 each 1   ketoconazole (NIZORAL) 2 % cream Apply 1 application topically daily.     Latanoprostene Bunod 0.024 % SOLN INSTILL 1 DROP IN BOTH EYES AT BEDTIME (APPROVED)     metFORMIN (GLUCOPHAGE-XR) 500 MG 24 hr tablet TAKE 1 TABLET(500 MG) BY MOUTH DAILY WITH BREAKFAST 90 tablet 2   nystatin-triamcinolone ointment (MYCOLOG) APPLY MODERATE AMOUNT TO AFFECTED AREA TWICE A DAY     omeprazole (PRILOSEC) 20 MG capsule Take 20 mg by mouth at bedtime.     PRESCRIPTION MEDICATION Apply 1 application topically daily. Cream to feet      PRESCRIPTION MEDICATION Apply 1 application topically daily. Cream to back      scopolamine (TRANSDERM-SCOP) 1 MG/3DAYS Place 1 patch (1.5 mg total) onto the skin every 3 (three) days. 4 patch 0   sildenafil (VIAGRA) 100 MG tablet Take 100 mg by mouth daily as needed for erectile dysfunction.     simvastatin (ZOCOR) 20 MG tablet Take 10 mg by mouth at bedtime.     terbinafine (LAMISIL) 1 % cream Apply 1 application topically 2 (two) times daily.     triamcinolone ointment (KENALOG) 0.1 % Apply 1 application topically 2 (two) times daily.     triamterene-hydrochlorothiazide (MAXZIDE) 75-50 MG per tablet Take 0.5 tablets by mouth daily.     gabapentin (NEURONTIN) 300 MG capsule Take 300 mg by mouth at bedtime. Twice a day.     No facility-administered  medications prior to visit.    PAST MEDICAL HISTORY: Past Medical History:  Diagnosis Date   Arthritis    Chronic back pain    Chronic kidney disease    Cough    Diabetes mellitus type II, controlled ([place])    Dry skin    Floaters in visual field    GERD (gastroesophageal reflux disease)    Glaucoma, both eyes    Hay fever    Heartburn    Hyperlipemia    Hypertension    Itching    Joint pain    Kidney disease  Lactose intolerance    Leg cramp    Low back pain    Multiple food allergies    Muscle pain    Neuropathy    rt leg from DDD   Prostate cancer ([place])    Rash    Sleep apnea    uses a cpap   Stiff neck    Swelling of lower extremity    Trouble in sleeping    Vision changes    Vitamin D deficiency    Wears glasses    Wears partial dentures    top mid partial-flipper    PAST SURGICAL HISTORY: Past Surgical History:  Procedure Laterality Date   COLONOSCOPY     DENTAL SURGERY     extractions   EYE SURGERY     ORIF PATELLA Right 01/24/2013   Procedure: OPEN REDUCTION INTERNAL (ORIF) FIXATION PATELLA;  Surgeon: Kerin Salen, MD;  Location: [facility];  Service: Orthopedics;  Laterality: Right;  orif right patella,patellectomy    PROSTATECTOMY  02/03/2021   [facility]    FAMILY HISTORY: Family History  Problem Relation Age of Onset   Arthritis Mother    Hearing loss Mother    Hyperlipidemia Mother    Hypertension Mother    Diabetes Mother    Cancer Father    Hypertension Father    Diabetes Brother    Hyperlipidemia Brother    Hypertension Brother    Breast cancer Neg Hx    Prostate cancer Neg Hx    Colon cancer Neg Hx    Pancreatic cancer Neg Hx     SOCIAL HISTORY: Social History   Socioeconomic History   Marital status: Married    Spouse name: [name]   Number of children: 5   Years of education: 13   Highest education level: Some college, no degree  Occupational History    Comment: retired  Tobacco Use   Smoking status:  Never   Smokeless tobacco: Never  Vaping Use   Vaping Use: Never used  Substance and Sexual Activity   Alcohol use: No   Drug use: No   Sexual activity: Not Currently  Other Topics Concern   Not on file  Social History Narrative   Lives with his wife. He enjoys walking everyday.   Social Determinants of Health   Financial Resource Strain: Low Risk  (04/13/2021)   Overall Financial Resource Strain (CARDIA)    Difficulty of Paying Living Expenses: Not hard at all  Food Insecurity: No Food Insecurity (04/13/2021)   Hunger Vital Sign    Worried About Running Out of Food in the Last Year: Never true    Ran Out of Food in the Last Year: Never true  Transportation Needs: No Transportation Needs (04/13/2021)   PRAPARE - Hydrologist (Medical): No    Lack of Transportation (Non-Medical): No  Physical Activity: Inactive (04/13/2021)   Exercise Vital Sign    Days of Exercise per Week: 0 days    Minutes of Exercise per Session: 0 min  Stress: No Stress Concern Present (04/13/2021)   [place]    Feeling of Stress : Not at all  Social Connections: Moderately Isolated (04/13/2021)   Social Connection and Isolation Panel [NHANES]    Frequency of Communication with Friends and Family: Once a week    Frequency of Social Gatherings with Friends and Family: Once a week    Attends Religious Services: More than 4 times  per year    Active Member of Clubs or Organizations: No    Attends Archivist Meetings: Never    Marital Status: Married  Human resources officer Violence: Not At Risk (04/13/2021)   Humiliation, Afraid, Rape, and Kick questionnaire    Fear of Current or Ex-Partner: No    Emotionally Abused: No    Physically Abused: No    Sexually Abused: No     PHYSICAL EXAM  Vitals:   05/17/22 1019  BP: 119/70  Pulse: (!) 53  SpO2: 98%  Weight: 274 lb 6.4 oz (124.5 kg)  Height: $Remove'5\' 10"'wSlJkhr$   (1.778 m)   Body mass index is 39.37 kg/m.  Generalized: Well developed, in no acute distress  Cardiology: normal rate and rhythm, no murmur noted Respiratory: clear to auscultation bilaterally  Neurological examination  Mentation: Alert oriented to time, place, history taking. Follows all commands speech and language fluent Cranial nerve II-XII: Pupils were equal round reactive to light. Extraocular movements were full, visual field were full  Motor: The motor testing reveals 5 over 5 strength of all 4 extremities. Good symmetric motor tone is noted throughout.  Gait and station: Gait is normal.    DIAGNOSTIC DATA (LABS, IMAGING, TESTING) - I reviewed patient records, labs, notes, testing and imaging myself where available.      No data to display           Lab Results  Component Value Date   WBC 5.2 01/20/2018   HGB 13.0 01/20/2018   HCT 37.9 (L) 01/20/2018   MCV 88.7 01/20/2018   PLT 188.0 01/20/2018      Component Value Date/Time   NA 136 06/08/2019 0850   K 4.1 06/08/2019 0850   CL 104 06/08/2019 0850   CO2 24 06/08/2019 0850   GLUCOSE 101 (H) 06/08/2019 0850   BUN 20 06/08/2019 0850   CREATININE 1.13 06/08/2019 0850   CALCIUM 9.4 06/08/2019 0850   PROT 6.9 06/08/2019 0850   ALBUMIN 4.5 06/08/2019 0850   AST 18 06/08/2019 0850   ALT 20 06/08/2019 0850   ALKPHOS 38 (L) 06/08/2019 0850   BILITOT 0.9 06/08/2019 0850   Lab Results  Component Value Date   CHOL 178 06/08/2019   HDL 56.50 06/08/2019   LDLCALC 111 (H) 06/08/2019   TRIG 55.0 06/08/2019   CHOLHDL 3 06/08/2019   Lab Results  Component Value Date   HGBA1C 5.8 06/08/2019   No results found for: "VITAMINB12" Lab Results  Component Value Date   TSH 2.28 09/13/2018     ASSESSMENT AND PLAN 66 y.o. year old male  has a past medical history of Arthritis, Chronic back pain, Chronic kidney disease, Cough, Diabetes mellitus type II, controlled ([place]), Dry skin, Floaters in visual field, GERD  (gastroesophageal reflux disease), Glaucoma, both eyes, Hay fever, Heartburn, Hyperlipemia, Hypertension, Itching, Joint pain, Kidney disease, Lactose intolerance, Leg cramp, Low back pain, Multiple food allergies, Muscle pain, Neuropathy, Prostate cancer ([place]), Rash, Sleep apnea, Stiff neck, Swelling of lower extremity, Trouble in sleeping, Vision changes, Vitamin D deficiency, Wears glasses, and Wears partial dentures. here with     ICD-10-CM   1. OSA on CPAP  G47.33        Tanner Young is doing well on CPAP therapy. Compliance report reveals excellent compliance. He was encouraged to continue using CPAP nightly and for greater than 4 hours each night. He will monitor for leak at home. I have encouraged him to change out mask every 30 days. AHI  slightly elevated, today. Will monitor closely. He will be eligible for a new machine early 2024. He will reach out when ready to get a new machine. We will update supply orders as indicated. Risks of untreated sleep apnea review and education materials provided. Healthy lifestyle habits encouraged. He will follow up in 1 year, sooner if needed. He verbalizes understanding and agreement with this plan.    No orders of the defined types were placed in this encounter.    No orders of the defined types were placed in this encounter.     Debbora Presto, FNP-C 05/17/2022, 10:36 AM [facility] [address] [place], [place] [address] [phone]

## 2022-05-13 NOTE — Patient Instructions (Addendum)
Please continue using your CPAP regularly. While your insurance requires that you use CPAP at least 4 hours each night on 70% of the nights, I recommend, that you not skip any nights and use it throughout the night if you can. Getting used to CPAP and staying with the treatment long term does take time and patience and discipline. Untreated obstructive sleep apnea when it is moderate to severe can have an adverse impact on cardiovascular health and raise her risk for heart disease, arrhythmias, hypertension, congestive heart failure, stroke and diabetes. Untreated obstructive sleep apnea causes sleep disruption, nonrestorative sleep, and sleep deprivation. This can have an impact on your day to day functioning and cause daytime sleepiness and impairment of cognitive function, memory loss, mood disturbance, and problems focussing. Using CPAP regularly can improve these symptoms.   You may be eligible for a new CPAP machine around February 2024. We can repeat home sleep study and get new machine ordered when you are ready. Call me for an appt if sooner then follow up in 05/2023.

## 2022-05-17 ENCOUNTER — Ambulatory Visit (INDEPENDENT_AMBULATORY_CARE_PROVIDER_SITE_OTHER): Payer: Medicare Other | Admitting: Family Medicine

## 2022-05-17 ENCOUNTER — Encounter: Payer: Self-pay | Admitting: Family Medicine

## 2022-05-17 ENCOUNTER — Ambulatory Visit (INDEPENDENT_AMBULATORY_CARE_PROVIDER_SITE_OTHER): Payer: Medicare Other | Admitting: Clinical

## 2022-05-17 VITALS — BP 119/70 | HR 53 | Ht 70.0 in | Wt 274.4 lb

## 2022-05-17 DIAGNOSIS — G4733 Obstructive sleep apnea (adult) (pediatric): Secondary | ICD-10-CM

## 2022-05-17 DIAGNOSIS — F4322 Adjustment disorder with anxiety: Secondary | ICD-10-CM

## 2022-05-17 NOTE — Progress Notes (Signed)
Time: 9:03 am-9:57 am CPT Code: 74128N-86 Diagnosis Code: F43.22  Tanner Young was seen remotely using secure video conferencing. He was in his home and therapist was in her office at time of appointment. He shared that he has been doing well since his last appointment, and that he has had a sense of things slowing down with the start of fall. He and his wife have been able to resolve financial stressors, and have several pleasant events planned in the coming months. He has also been working on exercising more regularly. He is scheduled to be seen again in 6 weeks.  Treatment Plan Client Abilities/Strengths  Tanner Young described himself as willing to complete therapy appointment. He described himself as guarded in nature but willing to try to open up in his sessions.  Client Treatment Preferences:  None. He described himself as open to virtual or in-person therapy.  Client Statement of Needs  Tanner Young is seeking clarity in terms of his own emotional experience of his cancer diagnosis. He has noticed a bigger emotional response to his support group than to others groups he has participated in, and would like to unpack his emotional experience of this. Treatment Level  Monthly   Symptoms  Difficulty communicating about emotionally challenging topics, tendency to shut down and internalize Problems Addressed  Goals 1. List Goal. Add as many as needed Objective Tanner Young would like to increase his openness to talking about emoti\onally challenging topics  Target Date: 02/26/2023 Frequency: Monthly  Progress: 0 Modality: Individual  Related Interventions 1. Tanner Young will be provided with opportunities to process his experiences in session 2.  Therapist will help Tanner Young to notice and disengage from maladaptive thoughts nd behaviors using CBT-based strategies 3. Therapist will provide emotion regulation strategies  including meditation, mindfulness, and general self-care 4. Therapist will provide referrals for  additional resources as appropriate Objective [name] would like to reduce his anxiety related to support groups he attends regularly, as well as in relation to day-to-day events  Target Date: 02/26/2023 Frequency: Monthly  Progress: 0 Modality: individual  Related Interventions Diagnosis Axis none Adjustment Disorder with Anxiety, F43.22   Axis none    Conditions For Discharge Achievement of treatment goals and objectives        Myrtie Cruise, PhD               Myrtie Cruise, PhD

## 2022-05-18 ENCOUNTER — Telehealth: Payer: Self-pay

## 2022-05-25 ENCOUNTER — Telehealth: Payer: Self-pay

## 2022-05-25 ENCOUNTER — Other Ambulatory Visit: Payer: Self-pay | Admitting: Family Medicine

## 2022-05-25 NOTE — Telephone Encounter (Signed)
[name] called for her husband, Tanner Young, stating he will need more valium for the 3 upcoming dental procedures. She reports he gets really anxious.

## 2022-05-26 ENCOUNTER — Other Ambulatory Visit: Payer: Self-pay

## 2022-05-26 MED ORDER — DIAZEPAM 10 MG PO TABS: ORAL_TABLET | ORAL | 0 refills | Status: AC

## 2022-05-26 NOTE — Telephone Encounter (Signed)
Patient's wife advised

## 2022-07-02 ENCOUNTER — Ambulatory Visit: Payer: Medicare Other | Admitting: Clinical

## 2022-09-08 ENCOUNTER — Ambulatory Visit (INDEPENDENT_AMBULATORY_CARE_PROVIDER_SITE_OTHER): Payer: Medicare Other | Admitting: Clinical

## 2022-09-08 DIAGNOSIS — F4322 Adjustment disorder with anxiety: Secondary | ICD-10-CM | POA: Diagnosis not present

## 2022-09-08 NOTE — Progress Notes (Signed)
Time: 10:03 am-10:57 am CPT Code: [id number] Diagnosis Code: F43.22  Tanner Young was seen remotely using secure video conferencing. He was in his home and therapist was in her office at time of appointment. He shared that he continues to do well, but also experiences anxiety prior to tests he undergoes every three months to ensure his cancer has not grown. Therapist processed this with him, offering validation and support, and working with him to identify coping strategies. He is scheduled to be seen again in 6 weeks.  Treatment Plan Client Abilities/Strengths  Tanner Young described himself as willing to complete therapy appointment. He described himself as guarded in nature but willing to try to open up in his sessions.  Client Treatment Preferences:  None. He described himself as open to virtual or in-person therapy.  Client Statement of Needs  Tanner Young is seeking clarity in terms of his own emotional experience of his cancer diagnosis. He has noticed a bigger emotional response to his support group than to others groups he has participated in, and would like to unpack his emotional experience of this. Treatment Level  Monthly   Symptoms  Difficulty communicating about emotionally challenging topics, tendency to shut down and internalize Problems Addressed  Goals 1. List Goal. Add as many as needed Objective Tanner Young would like to increase his openness to talking about emoti\onally challenging topics  Target Date: 02/26/2023 Frequency: Monthly  Progress: 0 Modality: Individual  Related Interventions 1. Lennette Bihari will be provided with opportunities to process his experiences in session 2.  Therapist will help Tanner Young to notice and disengage from maladaptive thoughts nd behaviors using CBT-based strategies 3. Therapist will provide emotion regulation strategies  including meditation, mindfulness, and general self-care 4. Therapist will provide referrals for additional resources as  appropriate Objective Tanner Young would like to reduce his anxiety related to support groups he attends regularly, as well as in relation to day-to-day events  Target Date: 02/26/2023 Frequency: Monthly  Progress: 0 Modality: individual  Related Interventions Diagnosis Axis none Adjustment Disorder with Anxiety, F43.22   Axis none    Conditions For Discharge Achievement of treatment goals and objectives              Myrtie Cruise, PhD               Myrtie Cruise, PhD

## 2022-10-08 ENCOUNTER — Ambulatory Visit (INDEPENDENT_AMBULATORY_CARE_PROVIDER_SITE_OTHER): Payer: Medicare Other | Admitting: Family Medicine

## 2022-10-08 DIAGNOSIS — Z0000 Encounter for general adult medical examination without abnormal findings: Secondary | ICD-10-CM | POA: Diagnosis not present

## 2022-10-08 NOTE — Patient Instructions (Signed)
[name] Maintenance Summary and Written Plan of Care  [name] ,  Thank you for allowing me to perform your Medicare Annual Wellness Visit and for your ongoing commitment to your health.   Health Maintenance & Immunization History Health Maintenance  Topic Date Due   Diabetic kidney evaluation - Urine ACR  10/09/2022 (Originally 01/31/2019)   FOOT EXAM  10/09/2022 (Originally 06/07/2020)   Diabetic kidney evaluation - eGFR measurement  10/10/2022 (Originally 06/07/2020)   COVID-19 Vaccine (8 - 2023-24 season) 10/24/2022 (Originally 07/07/2022)   Pneumonia Vaccine 36+ Years old (1 of 1 - PCV) 12/04/2022 (Originally 08/07/2019)   Hepatitis C Screening  10/09/2023 (Originally 08/06/1974)   HEMOGLOBIN A1C  12/02/2022   OPHTHALMOLOGY EXAM  08/31/2023   Medicare Annual Wellness (AWV)  10/09/2023   DTaP/Tdap/Td (3 - Td or Tdap) 09/14/2028   COLONOSCOPY (Pts 45-55yr Insurance coverage will need to be confirmed)  09/21/2028   INFLUENZA VACCINE  Completed   Zoster Vaccines- Shingrix  Completed   HPV VACCINES  Aged Out   Immunization History  Administered Date(s) Administered   Covid-19, Mrna,Vaccine(Spikevax)140yrand older 05/12/2022   Fluad Quad(high Dose 65+) 05/01/2020   H1N1 08/22/2008   Influenza Split 04/16/2014, 04/17/2015, 04/16/2016   Influenza, High Dose Seasonal PF 04/21/2022   Influenza,inj,Quad PF,6+ Mos 05/17/2017, 05/01/2018, 04/13/2019   Influenza-Unspecified 06/29/2002, 06/12/2004, 06/10/2005, 06/24/2006, 06/28/2007, 05/20/2008, 06/08/2010, 05/11/2011, 05/15/2012, 05/24/2013, 05/29/2015, 04/01/2016, 04/24/2018, 05/24/2019, 06/04/2020, 03/16/2021   Moderna Covid-19 Vaccine Bivalent Booster 1829yr up 04/27/2021   Moderna Sars-Covid-2 Vaccination 09/04/2019, 10/03/2019, 06/14/2020, 09/03/2020, 11/19/2020   Td (Adult) 09/14/2018   Tdap 08/22/2008   Tetanus 08/22/2008   Zoster Recombinat (Shingrix) 04/13/2019, 06/15/2019    These are the  patient goals that we discussed:  Goals Addressed               This Visit's Progress     Patient Stated (pt-stated)        Patient stated that he would like to maintain his A1C at below 6.         This is a list of Health Maintenance Items that are overdue or due now: Urine ACR Kidney Evaluation Foot exam Eye exam  Patient will have these records faxed to our office.    Orders/Referrals Placed Today: No orders of the defined types were placed in this encounter.  (Contact our referral department at [phone] you have not spoken with someone about your referral appointment within the next 5 days)    Follow-up Plan Follow-up with [facility] as planned Medicare wellness visit in one year. Patient will access AVS on my chart.     Health Maintenance, Male Adopting a healthy lifestyle and getting preventive care are important in promoting health and wellness. Ask your health care provider about: The right schedule for you to have regular tests and exams. Things you can do on your own to prevent diseases and keep yourself healthy. What should I know about diet, weight, and exercise? Eat a healthy diet  Eat a diet that includes plenty of vegetables, fruits, low-fat dairy products, and lean protein. Do not eat a lot of foods that are high in solid fats, added sugars, or sodium. Maintain a healthy weight Body mass index (BMI) is a measurement that can be used to identify possible weight problems. It estimates body fat based on height and weight. Your health care provider can help determine your BMI and help you achieve or maintain a healthy weight. Get  regular exercise Get regular exercise. This is one of the most important things you can do for your health. Most adults should: Exercise for at least 150 minutes each week. The exercise should increase your heart rate and make you sweat (moderate-intensity exercise). Do strengthening exercises at least twice a  week. This is in addition to the moderate-intensity exercise. Spend less time sitting. Even light physical activity can be beneficial. Watch cholesterol and blood lipids Have your blood tested for lipids and cholesterol at 69 years of age, then have this test every 5 years. You may need to have your cholesterol levels checked more often if: Your lipid or cholesterol levels are high. You are older than 69 years of age. You are at high risk for heart disease. What should I know about cancer screening? Many types of cancers can be detected early and may often be prevented. Depending on your health history and family history, you may need to have cancer screening at various ages. This may include screening for: Colorectal cancer. Prostate cancer. Skin cancer. Lung cancer. What should I know about heart disease, diabetes, and high blood pressure? Blood pressure and heart disease High blood pressure causes heart disease and increases the risk of stroke. This is more likely to develop in people who have high blood pressure readings or are overweight. Talk with your health care provider about your target blood pressure readings. Have your blood pressure checked: Every 3-5 years if you are 30-55 years of age. Every year if you are 65 years old or older. If you are between the ages of 36 and 74 and are a current or former smoker, ask your health care provider if you should have a one-time screening for abdominal aortic aneurysm (AAA). Diabetes Have regular diabetes screenings. This checks your fasting blood sugar level. Have the screening done: Once every three years after age 71 if you are at a normal weight and have a low risk for diabetes. More often and at a younger age if you are overweight or have a high risk for diabetes. What should I know about preventing infection? Hepatitis B If you have a higher risk for hepatitis B, you should be screened for this virus. Talk with your health care  provider to find out if you are at risk for hepatitis B infection. Hepatitis C Blood testing is recommended for: Everyone born from [birth year] through 1967. Anyone with known risk factors for hepatitis C. Sexually transmitted infections (STIs) You should be screened each year for STIs, including gonorrhea and chlamydia, if: You are sexually active and are younger than 69 years of age. You are older than 69 years of age and your health care provider tells you that you are at risk for this type of infection. Your sexual activity has changed since you were last screened, and you are at increased risk for chlamydia or gonorrhea. Ask your health care provider if you are at risk. Ask your health care provider about whether you are at high risk for HIV. Your health care provider may recommend a prescription medicine to help prevent HIV infection. If you choose to take medicine to prevent HIV, you should first get tested for HIV. You should then be tested every 3 months for as long as you are taking the medicine. Follow these instructions at home: Alcohol use Do not drink alcohol if your health care provider tells you not to drink. If you drink alcohol: Limit how much you have to 0-2 drinks a day. Know  how much alcohol is in your drink. In the [place] one drink equals one 12 oz bottle of beer (355 mL), one 5 oz glass of wine (148 mL), or one 1 oz glass of hard liquor (44 mL). Lifestyle Do not use any products that contain nicotine or tobacco. These products include cigarettes, chewing tobacco, and vaping devices, such as e-cigarettes. If you need help quitting, ask your health care provider. Do not use street drugs. Do not share needles. Ask your health care provider for help if you need support or information about quitting drugs. General instructions Schedule regular health, dental, and eye exams. Stay current with your vaccines. Tell your health care provider if: You often feel depressed. You have  ever been abused or do not feel safe at home. Summary Adopting a healthy lifestyle and getting preventive care are important in promoting health and wellness. Follow your health care provider's instructions about healthy diet, exercising, and getting tested or screened for diseases. Follow your health care provider's instructions on monitoring your cholesterol and blood pressure. This information is not intended to replace advice given to you by your health care provider. Make sure you discuss any questions you have with your health care provider. Document Revised: 12/22/2020 Document Reviewed: 12/22/2020 Elsevier Patient Education  [place].

## 2022-10-08 NOTE — Progress Notes (Signed)
MEDICARE ANNUAL WELLNESS VISIT  10/08/2022  Telephone Visit Disclaimer This Medicare AWV was conducted by telephone due to national recommendations for restrictions regarding the COVID-19 Pandemic (e.g. social distancing).  I verified, using two identifiers, that I am speaking with Tanner Young or their authorized healthcare agent. I discussed the limitations, risks, security, and privacy concerns of performing an evaluation and management service by telephone and the potential availability of an in-person appointment in the future. The patient expressed understanding and agreed to proceed.  Location of Patient: Home Location of Provider (nurse):  Provider home  Subjective:    Tanner Young is a 67 y.o. male patient of Tanner Nutting, DO who had a Medicare Annual Wellness Visit today via telephone. [name] is Retired and lives with their spouse. he has 5 children. he reports that he is socially active and does interact with friends/family regularly. he is moderately physically active and enjoys walking and being in the water.  Patient Care Team: Tanner Nutting, DO as PCP - General (Family Medicine) Nahser, Wonda Cheng, MD as PCP - Cardiology (Cardiology) Cira Rue, RN as Oncology Nurse Navigator     10/08/2022    8:03 AM 04/13/2021   10:56 AM 10/28/2020    8:24 AM 01/23/2013    5:02 PM  Advanced Directives  Does Patient Have a Medical Advance Directive? No No No Patient has advance directive, copy not in chart  Would patient like information on creating a medical advance directive? No - Patient declined No - Patient declined No - Patient declined     Hospital Utilization Over the Past 12 Months: # of hospitalizations or ER visits: 0 # of surgeries: 0  Review of Systems    Patient reports that his overall health is unchanged compared to last year.  History obtained from chart review and the patient  Patient Reported Readings (BP, Pulse, CBG, Weight, etc) none  Pain  Assessment Pain : No/denies pain     Current Medications & Allergies (verified) Allergies as of 10/08/2022       Reactions   Shellfish Allergy Itching, Nausea And Vomiting   Shrimp   Lisinopril Cough        Medication List        Accurate as of October 08, 2022  8:17 AM. If you have any questions, ask your nurse or doctor.          amLODipine 5 MG tablet Commonly known as: NORVASC Take 5 mg by mouth daily.   benazepril 40 MG tablet Commonly known as: LOTENSIN Take 40 mg by mouth daily.   blood glucose meter kit and supplies Kit Dispense based on patient and insurance preference. Use daily as directed to monitor blood sugar. (FOR ICD-9 250.00, 250.01).   brimonidine 0.15 % ophthalmic solution Commonly known as: ALPHAGAN Place 1 drop into both eyes 2 (two) times daily at 10 AM and 5 PM.   cetirizine 10 MG tablet Commonly known as: ZYRTEC Take 10 mg by mouth daily. As needed.   cholecalciferol 1000 units tablet Commonly known as: VITAMIN D Take 1,000 Units by mouth daily.   desonide 0.05 % cream Commonly known as: DESOWEN Apply topically 2 (two) times daily.   diazepam 10 MG tablet Commonly known as: VALIUM TAKE 1 TABLET BY MOUTH ONCE FOR 1 DOSE. TAKE 1 HOURS PRIOR TO DENTAL PROCEDURE   dorzolamide-timolol 2-0.5 % ophthalmic solution Commonly known as: COSOPT Place 1 drop into both eyes 2 (two) times daily.   Easy  Touch Lancets 26G Misc Use as directed daily   empagliflozin 25 MG Tabs tablet Commonly known as: JARDIANCE Take 0.5 tablets by mouth daily.   famotidine 20 MG tablet Commonly known as: PEPCID TAKE ONE TABLET BY MOUTH DAILY FOR GERD   fluocinonide 0.05 % external solution Commonly known as: LIDEX APPLY SMALL AMOUNT TO AFFECTED AREA EVERY 7 DAYS AS NEEDED FOR SCALP ITCH   fluticasone 50 MCG/ACT nasal spray Commonly known as: FLONASE INSTILL 1 SPRAY IN EACH NOSTRIL TWICE A DAY   gabapentin 300 MG capsule Commonly known as:  NEURONTIN Take 600 mg by mouth. Takes 2 in the morning and 2 at bedtime   ketoconazole 2 % cream Commonly known as: NIZORAL Apply 1 application topically daily.   Latanoprostene Bunod 0.024 % Soln Only uses for left eye once a day   metFORMIN 500 MG 24 hr tablet Commonly known as: GLUCOPHAGE-XR TAKE 1 TABLET(500 MG) BY MOUTH DAILY WITH BREAKFAST   nystatin-triamcinolone ointment Commonly known as: MYCOLOG APPLY MODERATE AMOUNT TO AFFECTED AREA TWICE A DAY   omeprazole 20 MG capsule Commonly known as: PRILOSEC Take 20 mg by mouth at bedtime.   PRECISION XTRA TEST STRIPS test strip Generic drug: glucose blood Use as directed daily.   PRESCRIPTION MEDICATION Apply 1 application topically daily. Cream to feet   PRESCRIPTION MEDICATION Apply 1 application topically daily. Cream to back   scopolamine 1 MG/3DAYS Commonly known as: TRANSDERM-SCOP Place 1 patch (1.5 mg total) onto the skin every 3 (three) days.   sildenafil 100 MG tablet Commonly known as: VIAGRA Take 100 mg by mouth daily as needed for erectile dysfunction.   simvastatin 20 MG tablet Commonly known as: ZOCOR Take 10 mg by mouth at bedtime.   terbinafine 1 % cream Commonly known as: LAMISIL Apply 1 application topically 2 (two) times daily.   triamcinolone ointment 0.1 % Commonly known as: KENALOG Apply 1 application topically 2 (two) times daily.   triamterene-hydrochlorothiazide 75-50 MG tablet Commonly known as: MAXZIDE Take 0.5 tablets by mouth daily.        History (reviewed): Past Medical History:  Diagnosis Date   Arthritis    Chronic back pain    Chronic kidney disease    Cough    Diabetes mellitus type II, controlled ([place])    Dry skin    Floaters in visual field    GERD (gastroesophageal reflux disease)    Glaucoma, both eyes    Hay fever    Heartburn    Hyperlipemia    Hypertension    Itching    Joint pain    Kidney disease    Lactose intolerance    Leg cramp    Low  back pain    Multiple food allergies    Muscle pain    Neuropathy    rt leg from DDD   Prostate cancer ([place])    Rash    Sleep apnea    uses a cpap   Stiff neck    Swelling of lower extremity    Trouble in sleeping    Vision changes    Vitamin D deficiency    Wears glasses    Wears partial dentures    top mid partial-flipper   Past Surgical History:  Procedure Laterality Date   COLONOSCOPY     DENTAL SURGERY     extractions   EYE SURGERY     ORIF PATELLA Right 01/24/2013   Procedure: OPEN REDUCTION INTERNAL (ORIF) FIXATION PATELLA;  Surgeon: Pilar Plate  Karmen Bongo, MD;  Location: [facility];  Service: Orthopedics;  Laterality: Right;  orif right patella,patellectomy    PROSTATECTOMY  02/03/2021   [facility]   Family History  Problem Relation Age of Onset   Arthritis Mother    Hearing loss Mother    Hyperlipidemia Mother    Hypertension Mother    Diabetes Mother    Cancer Father    Hypertension Father    Diabetes Brother    Hyperlipidemia Brother    Hypertension Brother    Breast cancer Neg Hx    Prostate cancer Neg Hx    Colon cancer Neg Hx    Pancreatic cancer Neg Hx    Social History   Socioeconomic History   Marital status: Married    Spouse name: [name]   Number of children: 5   Years of education: 13   Highest education level: Some college, no degree  Occupational History    Comment: retired  Tobacco Use   Smoking status: Never   Smokeless tobacco: Never  Vaping Use   Vaping Use: Never used  Substance and Sexual Activity   Alcohol use: No   Drug use: No   Sexual activity: Not Currently  Other Topics Concern   Not on file  Social History Narrative   Lives with his wife. He enjoys walking everyday.   Social Determinants of Health   Financial Resource Strain: Low Risk  (10/08/2022)   Overall Financial Resource Strain (CARDIA)    Difficulty of Paying Living Expenses: Not hard at all  Food Insecurity: No Food Insecurity (10/08/2022)   Hunger  Vital Sign    Worried About Running Out of Food in the Last Year: Never true    Ran Out of Food in the Last Year: Never true  Transportation Needs: No Transportation Needs (10/08/2022)   PRAPARE - Hydrologist (Medical): No    Lack of Transportation (Non-Medical): No  Physical Activity: Inactive (10/08/2022)   Exercise Vital Sign    Days of Exercise per Week: 0 days    Minutes of Exercise per Session: 0 min  Stress: No Stress Concern Present (10/08/2022)   Miles    Feeling of Stress : Not at all  Social Connections: Moderately Integrated (10/08/2022)   Social Connection and Isolation Panel [NHANES]    Frequency of Communication with Friends and Family: Twice a week    Frequency of Social Gatherings with Friends and Family: Twice a week    Attends Religious Services: More than 4 times per year    Active Member of [employer] or Organizations: No    Attends Archivist Meetings: Never    Marital Status: Married    Activities of Daily Living    10/08/2022    8:08 AM  In your present state of health, do you have any difficulty performing the following activities:  Hearing? 0  Vision? 0  Difficulty concentrating or making decisions? 0  Walking or climbing stairs? 0  Dressing or bathing? 0  Doing errands, shopping? 0  Preparing Food and eating ? N  Using the Toilet? N  In the past six months, have you accidently leaked urine? N  Do you have problems with loss of bowel control? N  Managing your Medications? N  Managing your Finances? N  Housekeeping or managing your Housekeeping? N    Patient Education/ Literacy How often do you need to have someone help you when  you read instructions, pamphlets, or other written materials from your doctor or pharmacy?: 1 - Never What is the last grade level you completed in school?: some college  Exercise Current Exercise Habits: Home exercise  routine, Type of exercise: walking, Time (Minutes): 60, Frequency (Times/Week): 3, Weekly Exercise (Minutes/Week): 180, Intensity: Mild, Exercise limited by: None identified  Diet Patient reports consuming 3 meals a day and 1 snack(s) a day Patient reports that his primary diet is: Regular Patient reports that she does have regular access to food.   Depression Screen    10/08/2022    8:04 AM 12/03/2021   10:55 AM 04/13/2021   10:57 AM 01/30/2020   11:00 AM 06/08/2019    8:28 AM 05/08/2018    8:50 AM 01/20/2018   10:35 AM  PHQ 2/9 Scores  PHQ - 2 Score 0 1 0 1 0 0 0  PHQ- 9 Score  '5  2  5      '$ Fall Risk    10/08/2022    8:03 AM 12/03/2021   10:35 AM 04/13/2021   10:56 AM 01/30/2020   11:00 AM 06/08/2019    8:28 AM  Fall Risk   Falls in the past year? 0 0 0 0 0  Number falls in past yr: 0 0 0 0   Injury with Fall? 0 0 0 0   Risk for fall due to : No Fall Risks No Fall Risks No Fall Risks    Follow up Falls evaluation completed Falls evaluation completed Falls evaluation completed       Objective:  Tanner Young seemed alert and oriented and he participated appropriately during our telephone visit.  Blood Pressure Weight BMI  BP Readings from Last 3 Encounters:  05/17/22 119/70  12/03/21 108/67  08/18/21 120/78   Wt Readings from Last 3 Encounters:  05/17/22 274 lb 6.4 oz (124.5 kg)  12/03/21 265 lb (120.2 kg)  08/18/21 268 lb (121.6 kg)   BMI Readings from Last 1 Encounters:  05/17/22 39.37 kg/m    *Unable to obtain current vital signs, weight, and BMI due to telephone visit type  Hearing/Vision  Henley did not seem to have difficulty with hearing/understanding during the telephone conversation Reports that he has had a formal eye exam by an eye care professional within the past year Reports that he has not had a formal hearing evaluation within the past year *Unable to fully assess hearing and vision during telephone visit type  Cognitive Function:    10/08/2022     8:09 AM 04/13/2021   11:01 AM  6CIT Screen  What Year? 0 points 0 points  What month? 0 points 0 points  What time? 0 points 0 points  Count back from 20 0 points 0 points  Months in reverse 0 points 0 points  Repeat phrase 0 points 2 points  Total Score 0 points 2 points   (Normal:0-7, Significant for Dysfunction: >8)  Normal Cognitive Function Screening: Yes   Immunization & Health Maintenance Record Immunization History  Administered Date(s) Administered   Covid-19, Mrna,Vaccine(Spikevax)55yr and older 05/12/2022   Fluad Quad(high Dose 65+) 05/01/2020   H1N1 08/22/2008   Influenza Split 04/16/2014, 04/17/2015, 04/16/2016   Influenza, High Dose Seasonal PF 04/21/2022   Influenza,inj,Quad PF,6+ Mos 05/17/2017, 05/01/2018, 04/13/2019   Influenza-Unspecified 06/29/2002, 06/12/2004, 06/10/2005, 06/24/2006, 06/28/2007, 05/20/2008, 06/08/2010, 05/11/2011, 05/15/2012, 05/24/2013, 05/29/2015, 04/01/2016, 04/24/2018, 05/24/2019, 06/04/2020, 03/16/2021   Moderna Covid-19 Vaccine Bivalent Booster 189yr& up 04/27/2021   Moderna Sars-Covid-2 Vaccination 09/04/2019, 10/03/2019,  06/14/2020, 09/03/2020, 11/19/2020   Td (Adult) 09/14/2018   Tdap 08/22/2008   Tetanus 08/22/2008   Zoster Recombinat (Shingrix) 04/13/2019, 06/15/2019    Health Maintenance  Topic Date Due   Diabetic kidney evaluation - Urine ACR  10/09/2022 (Originally 01/31/2019)   FOOT EXAM  10/09/2022 (Originally 06/07/2020)   Diabetic kidney evaluation - eGFR measurement  10/10/2022 (Originally 06/07/2020)   COVID-19 Vaccine (8 - 2023-24 season) 10/24/2022 (Originally 07/07/2022)   Pneumonia Vaccine 15+ Years old (1 of 1 - PCV) 12/04/2022 (Originally 08/07/2019)   Hepatitis C Screening  10/09/2023 (Originally 08/06/1974)   HEMOGLOBIN A1C  12/02/2022   OPHTHALMOLOGY EXAM  08/31/2023   Medicare Annual Wellness (AWV)  10/09/2023   DTaP/Tdap/Td (3 - Td or Tdap) 09/14/2028   COLONOSCOPY (Pts 45-14yr Insurance coverage  will need to be confirmed)  09/21/2028   INFLUENZA VACCINE  Completed   Zoster Vaccines- Shingrix  Completed   HPV VACCINES  Aged Out       Assessment  This is a routine wellness examination for [employer]  Health Maintenance: Due or Overdue There are no preventive care reminders to display for this patient.   KHartley Barefootdoes not need a referral for CCommercial Metals CompanyAssistance: Care Management:   no Social Work:    no Prescription Assistance:  no Nutrition/Diabetes Education:  no   Plan:  Personalized Goals  Goals Addressed               This Visit's Progress     Patient Stated (pt-stated)        Patient stated that he would like to maintain his A1C at below 6.       Personalized Health Maintenance & Screening Recommendations  Urine ACR Kidney Evaluation Foot exam Eye exam  Patient will have these records faxed to our office.   Lung Cancer Screening Recommended: no (Low Dose CT Chest recommended if Age 69-80years, 30 pack-year currently smoking OR have quit w/in past 15 years) Hepatitis C Screening recommended: yes HIV Screening recommended: no  Advanced Directives: Written information was not prepared per patient's request.  Referrals & Orders No orders of the defined types were placed in this encounter.   Follow-up Plan Follow-up with MLuetta Nutting DO as planned Medicare wellness visit in one year. Patient will access AVS on my chart.   I have personally reviewed and noted the following in the patient's chart:   Medical and social history Use of alcohol, tobacco or illicit drugs  Current medications and supplements Functional ability and status Nutritional status Physical activity Advanced directives List of other physicians Hospitalizations, surgeries, and ER visits in previous 12 months Vitals Screenings to include cognitive, depression, and falls Referrals and appointments  In addition, I have reviewed and discussed with KHartley Barefootcertain preventive protocols, quality metrics, and best practice recommendations. A written personalized care plan for preventive services as well as general preventive health recommendations is available and can be mailed to the patient at his request.      BTinnie Gens RN BSN  10/08/2022

## 2022-10-29 ENCOUNTER — Ambulatory Visit (INDEPENDENT_AMBULATORY_CARE_PROVIDER_SITE_OTHER): Payer: Medicare Other | Admitting: Clinical

## 2022-10-29 DIAGNOSIS — F4322 Adjustment disorder with anxiety: Secondary | ICD-10-CM | POA: Diagnosis not present

## 2022-10-29 NOTE — Progress Notes (Signed)
Time: 9:03 am-9:57 am CPT Code: [id number] Diagnosis Code: F43.22  Kinnith was seen remotely using secure video conferencing. He was in his home and therapist was in her office at time of appointment. He reflected upon anxiety he experiences when he notices aches and pains since receiving a cancer diagnosis. Therapist processed this with him, and he reported confidence in his doctors and in the testing he has been receiving regularly. He is scheduled to be seen again in 6 weeks.  Treatment Plan Client Abilities/Strengths  Krunal described himself as willing to complete therapy appointment. He described himself as guarded in nature but willing to try to open up in his sessions.  Client Treatment Preferences:  None. He described himself as open to virtual or in-person therapy.  Client Statement of Needs  Wilgus is seeking clarity in terms of his own emotional experience of his cancer diagnosis. He has noticed a bigger emotional response to his support group than to others groups he has participated in, and would like to unpack his emotional experience of this. Treatment Level  Monthly   Symptoms  Difficulty communicating about emotionally challenging topics, tendency to shut down and internalize Problems Addressed  Goals 1. List Goal. Add as many as needed Objective Biran would like to increase his openness to talking about emoti\onally challenging topics  Target Date: 02/26/2023 Frequency: Monthly  Progress: 0 Modality: Individual  Related Interventions 1. Lennette Bihari will be provided with opportunities to process his experiences in session 2.  Therapist will help Caryl to notice and disengage from maladaptive thoughts nd behaviors using CBT-based strategies 3. Therapist will provide emotion regulation strategies  including meditation, mindfulness, and general self-care 4. Therapist will provide referrals for additional resources as appropriate Objective Jasier would like to reduce his  anxiety related to support groups he attends regularly, as well as in relation to day-to-day events  Target Date: 02/26/2023 Frequency: Monthly  Progress: 0 Modality: individual  Related Interventions Diagnosis Axis none Adjustment Disorder with Anxiety, F43.22   Axis none    Conditions For Discharge Achievement of treatment goals and objectives       Myrtie Cruise, PhD               Myrtie Cruise, PhD

## 2022-12-24 ENCOUNTER — Ambulatory Visit (INDEPENDENT_AMBULATORY_CARE_PROVIDER_SITE_OTHER): Payer: Medicare Other | Admitting: Clinical

## 2022-12-24 DIAGNOSIS — F4322 Adjustment disorder with anxiety: Secondary | ICD-10-CM | POA: Diagnosis not present

## 2022-12-24 NOTE — Progress Notes (Signed)
Time: 9:03 am-9:57 am CPT Code: 16109U-04 Diagnosis Code: F43.22  Tanner Young was seen remotely using secure video conferencing. He was in his home and therapist was in her office at time of appointment. He reported that his numbers had been such at his most recent scan that it was determined he will need to continue with three month check ins. Session focused on processing this, exploring his understanding of remission. For homework, he will focus on self-care, including exercise and maintaining his yard.  He is scheduled to be seen again in 6 weeks, and will reach out if he needs to be seen sooner.  Treatment Plan Client Abilities/Strengths  Tanner Young described himself as willing to complete therapy appointment. He described himself as guarded in nature but willing to try to open up in his sessions.  Client Treatment Preferences:  None. He described himself as open to virtual or in-person therapy.  Client Statement of Needs  Tanner Young is seeking clarity in terms of his own emotional experience of his cancer diagnosis. He has noticed a bigger emotional response to his support group than to others groups he has participated in, and would like to unpack his emotional experience of this. Treatment Level  Monthly   Symptoms  Difficulty communicating about emotionally challenging topics, tendency to shut down and internalize Problems Addressed  Goals 1. List Goal. Add as many as needed Objective Tanner Young would like to increase his openness to talking about emoti\onally challenging topics  Target Date: 02/26/2023 Frequency: Monthly  Progress: 0 Modality: Individual  Related Interventions 1. Tanner Young will be provided with opportunities to process his experiences in session 2.  Therapist will help Tanner Young to notice and disengage from maladaptive thoughts nd behaviors using CBT-based strategies 3. Therapist will provide emotion regulation strategies  including meditation, mindfulness, and general self-care 4.  Therapist will provide referrals for additional resources as appropriate Objective Tanner Young would like to reduce his anxiety related to support groups he attends regularly, as well as in relation to day-to-day events  Target Date: 02/26/2023 Frequency: Monthly  Progress: 0 Modality: individual  Related Interventions Diagnosis Axis none Adjustment Disorder with Anxiety, F43.22   Axis none    Conditions For Discharge Achievement of treatment goals and objectives     Chrissie Noa, PhD               Chrissie Noa, PhD

## 2023-01-13 ENCOUNTER — Ambulatory Visit (INDEPENDENT_AMBULATORY_CARE_PROVIDER_SITE_OTHER): Payer: Medicare Other | Admitting: Family Medicine

## 2023-01-13 VITALS — BP 143/81 | HR 57 | Ht 70.0 in | Wt 273.0 lb

## 2023-01-13 DIAGNOSIS — Z7984 Long term (current) use of oral hypoglycemic drugs: Secondary | ICD-10-CM

## 2023-01-13 DIAGNOSIS — E119 Type 2 diabetes mellitus without complications: Secondary | ICD-10-CM | POA: Diagnosis not present

## 2023-01-13 LAB — POCT GLYCOSYLATED HEMOGLOBIN (HGB A1C): Hemoglobin A1C: 5.9 % — AB (ref 4.0–5.6)

## 2023-01-13 MED ORDER — OZEMPIC (0.25 OR 0.5 MG/DOSE) 2 MG/3ML ~~LOC~~ SOPN: PEN_INJECTOR | SUBCUTANEOUS | 0 refills | Status: DC

## 2023-01-13 NOTE — Patient Instructions (Addendum)
Start ozempic: Inject 0.25mg  once per week x4 weeks  After 4 weeks increase to 0.5mg  once per week.   Please drop off copy of labs from the [place].

## 2023-01-16 ENCOUNTER — Encounter: Payer: Self-pay | Admitting: Family Medicine

## 2023-01-16 NOTE — Progress Notes (Signed)
THEOPLIS Young - 67 y.o. male MRN [id number]  Date of birth: May 08, 1956  Subjective Chief Complaint  Patient presents with   Follow-up    HPI Tanner Young is a 67 year old male here today for follow-up visit.  He received quite a bit of his care to the [place] and reports having labs completed a few months ago.  He is unsure when his A1c was at that time.  He is not interested in adding medication to help with weight management as well as treatment of his diabetes.  Currently he is taking metformin and Jardiance for management of his diabetes.  Blood sugars have been fairly well-controlled.  ROS:  A comprehensive ROS was completed and negative except as noted per HPI  Allergies  Allergen Reactions   Shellfish Allergy Itching and Nausea And Vomiting    Shrimp    Lisinopril Cough    Past Medical History:  Diagnosis Date   Arthritis    Chronic back pain    Chronic kidney disease    Cough    Diabetes mellitus type II, controlled (HCC)    Dry skin    Floaters in visual field    GERD (gastroesophageal reflux disease)    Glaucoma, both eyes    Hay fever    Heartburn    Hyperlipemia    Hypertension    Itching    Joint pain    Kidney disease    Lactose intolerance    Leg cramp    Low back pain    Multiple food allergies    Muscle pain    Neuropathy    rt leg from DDD   Prostate cancer (HCC)    Rash    Sleep apnea    uses a cpap   Stiff neck    Swelling of lower extremity    Trouble in sleeping    Vision changes    Vitamin D deficiency    Wears glasses    Wears partial dentures    top mid partial-flipper    Past Surgical History:  Procedure Laterality Date   COLONOSCOPY     DENTAL SURGERY     extractions   EYE SURGERY     ORIF PATELLA Right 01/24/2013   Procedure: OPEN REDUCTION INTERNAL (ORIF) FIXATION PATELLA;  Surgeon: Nestor Lewandowsky, MD;  Location: [facility];  Service: Orthopedics;  Laterality: Right;  orif right patella,patellectomy     PROSTATECTOMY  02/03/2021   [facility]    Social History   Socioeconomic History   Marital status: Married    Spouse name: [name]   Number of children: 5   Years of education: 13   Highest education level: Some college, no degree  Occupational History    Comment: retired  Tobacco Use   Smoking status: Never   Smokeless tobacco: Never  Vaping Use   Vaping Use: Never used  Substance and Sexual Activity   Alcohol use: No   Drug use: No   Sexual activity: Not Currently  Other Topics Concern   Not on file  Social History Narrative   Lives with his wife. He enjoys walking everyday.   Social Determinants of Health   Financial Resource Strain: Low Risk  (01/13/2023)   Overall Financial Resource Strain (CARDIA)    Difficulty of Paying Living Expenses: Not very hard  Food Insecurity: No Food Insecurity (01/13/2023)   Hunger Vital Sign    Worried About Running Out of Food in the Last Year: Never true  Ran Out of Food in the Last Year: Never true  Transportation Needs: No Transportation Needs (01/13/2023)   PRAPARE - Administrator, Civil Service (Medical): No    Lack of Transportation (Non-Medical): No  Physical Activity: Insufficiently Active (01/13/2023)   Exercise Vital Sign    Days of Exercise per Week: 2 days    Minutes of Exercise per Session: 50 min  Stress: No Stress Concern Present (01/13/2023)   [employer] of Occupational Health - Occupational Stress Questionnaire    Feeling of Stress : Only a little  Social Connections: Socially Integrated (01/13/2023)   Social Connection and Isolation Panel [NHANES]    Frequency of Communication with Friends and Family: More than three times a week    Frequency of Social Gatherings with Friends and Family: Twice a week    Attends Religious Services: More than 4 times per year    Active Member of Clubs or Organizations: Yes    Attends Banker Meetings: 1 to 4 times per year    Marital Status: Married     Family History  Problem Relation Age of Onset   Arthritis Mother    Hearing loss Mother    Hyperlipidemia Mother    Hypertension Mother    Diabetes Mother    Cancer Father    Hypertension Father    Diabetes Brother    Hyperlipidemia Brother    Hypertension Brother    Breast cancer Neg Hx    Prostate cancer Neg Hx    Colon cancer Neg Hx    Pancreatic cancer Neg Hx     Health Maintenance  Topic Date Due   Diabetic kidney evaluation - Urine ACR  01/31/2019   Pneumonia Vaccine 43+ Years old (1 of 1 - PCV) Never done   Diabetic kidney evaluation - eGFR measurement  06/07/2020   FOOT EXAM  06/07/2020   COVID-19 Vaccine (8 - 2023-24 season) 07/07/2022   Hepatitis C Screening  10/09/2023 (Originally 08/06/1974)   INFLUENZA VACCINE  03/17/2023   HEMOGLOBIN A1C  07/16/2023   OPHTHALMOLOGY EXAM  08/31/2023   Medicare Annual Wellness (AWV)  10/09/2023   DTaP/Tdap/Td (3 - Td or Tdap) 09/14/2028   Colonoscopy  09/21/2028   Zoster Vaccines- Shingrix  Completed   HPV VACCINES  Aged Out     ----------------------------------------------------------------------------------------------------------------------------------------------------------------------------------------------------------------- Physical Exam BP (!) 143/81   Pulse (!) 57   Ht 5\' 10"  (1.778 m)   Wt 273 lb (123.8 kg)   SpO2 99%   BMI 39.17 kg/m   Physical Exam Constitutional:      Appearance: Normal appearance.  HENT:     Head: Normocephalic and atraumatic.  Eyes:     General: No scleral icterus. Neurological:     Mental Status: He is alert.  Psychiatric:        Mood and Affect: Mood normal.        Behavior: Behavior normal.     ------------------------------------------------------------------------------------------------------------------------------------------------------------------------------------------------------------------- Assessment and Plan  Type 2 diabetes mellitus without  complication, without long-term current use of insulin (HCC) Blood sugars remain pretty well-controlled.  I do think he would benefit from adding Ozempic to help with management of his diabetes as well as weight management.  I did provide him with a sample pen to try and will titrate this over the next couple months.    Ask him to provide a copy of his labs from the [place] that he had completed most recently.  Follow-up in about 3 months.   Meds  ordered this encounter  Medications   Semaglutide,0.25 or 0.5MG /DOS, (OZEMPIC, 0.25 OR 0.5 MG/DOSE,) 2 MG/3ML SOPN    Sig: Inject 0.25mg  weekly x4 weeks then increase to 0.5mg  weekly.  Lot: [id number] Exp: 06/15/24    Dispense:  3 mL    Refill:  0   Semaglutide,0.25 or 0.5MG /DOS, (OZEMPIC, 0.25 OR 0.5 MG/DOSE,) 2 MG/3ML SOPN    Sig: Inject 0.5mg  weekly (start after completion of sample pen)    Dispense:  9 mL    Refill:  0    Return in about 3 months (around 04/15/2023) for F/u Ozempic.    This visit occurred during the SARS-CoV-2 public health emergency.  Safety protocols were in place, including screening questions prior to the visit, additional usage of staff PPE, and extensive cleaning of exam room while observing appropriate contact time as indicated for disinfecting solutions.

## 2023-01-16 NOTE — Assessment & Plan Note (Addendum)
Blood sugars remain pretty well-controlled.  I do think he would benefit from adding Ozempic to help with management of his diabetes as well as weight management.  I did provide him with a sample pen to try and will titrate this over the next couple months.    Ask him to provide a copy of his labs from the [place] that he had completed most recently.  Follow-up in about 3 months.

## 2023-01-24 ENCOUNTER — Telehealth: Payer: Self-pay | Admitting: Family Medicine

## 2023-01-24 NOTE — Telephone Encounter (Signed)
Pt called. He wants script for Ozempic(3 mnths supply) sent through [facility].

## 2023-01-25 NOTE — Telephone Encounter (Signed)
In the process of contacting the pharmacy to verify if they will dispense a 3 month Rx for Ozempic.

## 2023-01-26 NOTE — Telephone Encounter (Signed)
Please advise pt that last Rx send on 01/13/23 was for 3 months. Per the pharmacy, they can only provide the amount of refills based on their supply.

## 2023-02-21 ENCOUNTER — Ambulatory Visit (INDEPENDENT_AMBULATORY_CARE_PROVIDER_SITE_OTHER): Payer: Medicare Other | Admitting: Clinical

## 2023-02-21 DIAGNOSIS — F4322 Adjustment disorder with anxiety: Secondary | ICD-10-CM

## 2023-02-21 NOTE — Progress Notes (Signed)
Time: 12:01 pm-12:55 pm CPT Code: 16109U-04 Diagnosis Code: F43.22  Tanner Young was seen remotely using secure video conferencing. He was in his home and therapist was in her office at time of appointment. Client is aware of risks of telehealth and consented to a virtual visit. He had completed his homework of exercising regularly and reported an overall stable mood since his last session. Session focused on processing recent events and considering goals going forward. He indicated that he would like to clean out his house, and therapist suggested a strategy to try. For homework, he will increase the frequency of exercise. He is scheduled to be seen again in 8 weeks.   Treatment Plan Client Abilities/Strengths  Maze described himself as willing to complete therapy appointment. He described himself as guarded in nature but willing to try to open up in his sessions.  Client Treatment Preferences:  None. He described himself as open to virtual or in-person therapy.  Client Statement of Needs  Tanner Young is seeking clarity in terms of his own emotional experience of his cancer diagnosis. He has noticed a bigger emotional response to his support group than to others groups he has participated in, and would like to unpack his emotional experience of this. Treatment Level  Monthly   Symptoms  Difficulty communicating about emotionally challenging topics, tendency to shut down and internalize Problems Addressed  Goals 1. List Goal. Add as many as needed Objective Tanner Young would like to increase his openness to talking about emoti\onally challenging topics  Target Date: 02/26/2023 Frequency: Monthly  Progress: 0 Modality: Individual  Related Interventions 1. Tanner Young will be provided with opportunities to process his experiences in session 2.  Therapist will help Tanner Young to notice and disengage from maladaptive thoughts nd behaviors using CBT-based strategies 3. Therapist will provide emotion regulation  strategies  including meditation, mindfulness, and general self-care 4. Therapist will provide referrals for additional resources as appropriate Objective Tanner Young would like to reduce his anxiety related to support groups he attends regularly, as well as in relation to day-to-day events  Target Date: 02/26/2023 Frequency: Monthly  Progress: 0 Modality: individual  Related Interventions Diagnosis Axis none Adjustment Disorder with Anxiety, F43.22   Axis none    Conditions For Discharge Achievement of treatment goals and objectives     Tanner Noa, PhD               Tanner Noa, PhD

## 2023-03-24 LAB — HM DIABETES EYE EXAM

## 2023-04-19 ENCOUNTER — Ambulatory Visit (INDEPENDENT_AMBULATORY_CARE_PROVIDER_SITE_OTHER): Payer: Medicare Other | Admitting: Family Medicine

## 2023-04-19 ENCOUNTER — Encounter: Payer: Self-pay | Admitting: Family Medicine

## 2023-04-19 VITALS — BP 122/74 | HR 56 | Ht 70.0 in | Wt 266.0 lb

## 2023-04-19 DIAGNOSIS — H6120 Impacted cerumen, unspecified ear: Secondary | ICD-10-CM | POA: Insufficient documentation

## 2023-04-19 DIAGNOSIS — I10 Essential (primary) hypertension: Secondary | ICD-10-CM | POA: Diagnosis not present

## 2023-04-19 DIAGNOSIS — H6121 Impacted cerumen, right ear: Secondary | ICD-10-CM

## 2023-04-19 DIAGNOSIS — E785 Hyperlipidemia, unspecified: Secondary | ICD-10-CM

## 2023-04-19 DIAGNOSIS — N189 Chronic kidney disease, unspecified: Secondary | ICD-10-CM | POA: Diagnosis not present

## 2023-04-19 DIAGNOSIS — E119 Type 2 diabetes mellitus without complications: Secondary | ICD-10-CM

## 2023-04-19 DIAGNOSIS — Z7984 Long term (current) use of oral hypoglycemic drugs: Secondary | ICD-10-CM

## 2023-04-19 LAB — POCT GLYCOSYLATED HEMOGLOBIN (HGB A1C): HbA1c, POC (controlled diabetic range): 5.6 % (ref 0.0–7.0)

## 2023-04-19 MED ORDER — SEMAGLUTIDE (1 MG/DOSE) 4 MG/3ML ~~LOC~~ SOPN: 1.0000 mg | PEN_INJECTOR | SUBCUTANEOUS | 1 refills | Status: DC

## 2023-04-19 NOTE — Progress Notes (Signed)
Tanner Young - 67 y.o. male MRN [id number]  Date of birth: [birth year]  Subjective Chief Complaint  Patient presents with   Diabetes   Hypertension   Ear Fullness    HPI Tanner Young is a 67 y.o. male here today for follow up visit.   He reports that he is doing well.  Continues   Continues on benazepril, maxzide and amlodipine for management of HTN.  He is taking as directed and reports that he is doing pretty well.   Remains on combination of Ozempic, jardiance and metformin.  Tolerating well at current strength.   He has not really made significant changes to diet or activity.  Blood sugars have been well controlled.  A1c at 5.6% today.    Tolerating simvastatin well at current strength.  Due for updated lipid panel.   Feels that R ear is "clogged".    ROS:  A comprehensive ROS was completed and negative except as noted per HPI  Allergies  Allergen Reactions   Shellfish Allergy Itching and Nausea And Vomiting    Shrimp    Lisinopril Cough    Past Medical History:  Diagnosis Date   Arthritis    Chronic back pain    Chronic kidney disease    Cough    Diabetes mellitus type II, controlled (HCC)    Dry skin    Floaters in visual field    GERD (gastroesophageal reflux disease)    Glaucoma, both eyes    Hay fever    Heartburn    Hyperlipemia    Hypertension    Itching    Joint pain    Kidney disease    Lactose intolerance    Leg cramp    Low back pain    Multiple food allergies    Muscle pain    Neuropathy    rt leg from DDD   Prostate cancer (HCC)    Rash    Sleep apnea    uses a cpap   Stiff neck    Swelling of lower extremity    Trouble in sleeping    Vision changes    Vitamin D deficiency    Wears glasses    Wears partial dentures    top mid partial-flipper    Past Surgical History:  Procedure Laterality Date   COLONOSCOPY     DENTAL SURGERY     extractions   EYE SURGERY     ORIF PATELLA Right 01/24/2013   Procedure: OPEN REDUCTION  INTERNAL (ORIF) FIXATION PATELLA;  Surgeon: Nestor Lewandowsky, MD;  Location: [facility];  Service: Orthopedics;  Laterality: Right;  orif right patella,patellectomy    PROSTATECTOMY  02/03/2021   [facility]    Social History   Socioeconomic History   Marital status: Married    Spouse name: [name]   Number of children: 5   Years of education: 13   Highest education level: Some college, no degree  Occupational History    Comment: retired  Tobacco Use   Smoking status: Never   Smokeless tobacco: Never  Vaping Use   Vaping status: Never Used  Substance and Sexual Activity   Alcohol use: No   Drug use: No   Sexual activity: Not Currently  Other Topics Concern   Not on file  Social History Narrative   Lives with his wife. He enjoys walking everyday.   Social Determinants of Health   Financial Resource Strain: Low Risk  (01/13/2023)   Overall Physicist, medical Strain (  CARDIA)    Difficulty of Paying Living Expenses: Not very hard  Food Insecurity: No Food Insecurity (01/13/2023)   Hunger Vital Sign    Worried About Running Out of Food in the Last Year: Never true    Ran Out of Food in the Last Year: Never true  Transportation Needs: No Transportation Needs (01/13/2023)   PRAPARE - Administrator, Civil Service (Medical): No    Lack of Transportation (Non-Medical): No  Physical Activity: Insufficiently Active (01/13/2023)   Exercise Vital Sign    Days of Exercise per Week: 2 days    Minutes of Exercise per Session: 50 min  Stress: No Stress Concern Present (01/13/2023)   [employer] of Occupational Health - Occupational Stress Questionnaire    Feeling of Stress : Only a little  Social Connections: Socially Integrated (01/13/2023)   Social Connection and Isolation Panel [NHANES]    Frequency of Communication with Friends and Family: More than three times a week    Frequency of Social Gatherings with Friends and Family: Twice a week    Attends Religious  Services: More than 4 times per year    Active Member of Clubs or Organizations: Yes    Attends Banker Meetings: 1 to 4 times per year    Marital Status: Married    Family History  Problem Relation Age of Onset   Arthritis Mother    Hearing loss Mother    Hyperlipidemia Mother    Hypertension Mother    Diabetes Mother    Cancer Father    Hypertension Father    Diabetes Brother    Hyperlipidemia Brother    Hypertension Brother    Breast cancer Neg Hx    Prostate cancer Neg Hx    Colon cancer Neg Hx    Pancreatic cancer Neg Hx     Health Maintenance  Topic Date Due   Diabetic kidney evaluation - Urine ACR  01/31/2019   Diabetic kidney evaluation - eGFR measurement  06/07/2020   COVID-19 Vaccine (8 - 2023-24 season) 07/05/2023 (Originally 04/17/2023)   Hepatitis C Screening  10/09/2023 (Originally 08/06/1974)   INFLUENZA VACCINE  11/14/2023 (Originally 03/17/2023)   Pneumonia Vaccine 14+ Years old (1 of 1 - PCV) 04/18/2024 (Originally 08/07/2019)   OPHTHALMOLOGY EXAM  08/31/2023   Medicare Annual Wellness (AWV)  10/09/2023   HEMOGLOBIN A1C  10/17/2023   FOOT EXAM  04/18/2024   DTaP/Tdap/Td (3 - Td or Tdap) 09/14/2028   Colonoscopy  09/21/2028   Zoster Vaccines- Shingrix  Completed   HPV VACCINES  Aged Out     ----------------------------------------------------------------------------------------------------------------------------------------------------------------------------------------------------------------- Physical Exam BP 122/74 (BP Location: Left Arm, Patient Position: Sitting, Cuff Size: Large)   Pulse (!) 56   Ht 5\' 10"  (1.778 m)   Wt 266 lb (120.7 kg)   SpO2 98%   BMI 38.17 kg/m   Physical Exam Constitutional:      Appearance: Normal appearance.  HENT:     Head: Normocephalic and atraumatic.     Ears:     Comments: Right ear cerumen impaction Cardiovascular:     Rate and Rhythm: Normal rate and regular rhythm.  Pulmonary:     Effort:  Pulmonary effort is normal.     Breath sounds: Normal breath sounds.  Musculoskeletal:     Cervical back: Neck supple.  Neurological:     Mental Status: He is alert.  Psychiatric:        Mood and Affect: Mood normal.  Behavior: Behavior normal.     ------------------------------------------------------------------------------------------------------------------------------------------------------------------------------------------------------------------- Assessment and Plan  Essential hypertension Blood pressure is well-controlled.  He will continue current medications for management of hypertension.  Type 2 diabetes mellitus without complication, without long-term current use of insulin (HCC) Blood sugars remain pretty well-controlled.  Doing well with Ozempic at this time.  Will continue this with titration to 1 mg.  Will drop metformin.  HLD (hyperlipidemia) -Doing well with simvastatin, continue.  Update lipid panel today.   Cerumen impaction Lavage completed today.  Tolerated well.    Meds ordered this encounter  Medications   DISCONTD: Semaglutide, 1 MG/DOSE, 4 MG/3ML SOPN    Sig: Inject 1 mg as directed once a week.    Dispense:  9 mL    Refill:  1   Semaglutide, 1 MG/DOSE, 4 MG/3ML SOPN    Sig: Inject 1 mg as directed once a week.    Dispense:  9 mL    Refill:  1    Return in about 6 months (around 10/17/2023) for HTN/T2DM.    This visit occurred during the SARS-CoV-2 public health emergency.  Safety protocols were in place, including screening questions prior to the visit, additional usage of staff PPE, and extensive cleaning of exam room while observing appropriate contact time as indicated for disinfecting solutions.

## 2023-04-19 NOTE — Assessment & Plan Note (Signed)
Blood sugars remain pretty well-controlled.  Doing well with Ozempic at this time.  Will continue this with titration to 1 mg.  Will drop metformin.

## 2023-04-19 NOTE — Patient Instructions (Signed)
Increase Ozempic to 1mg /week.  Updated prescription sent in.

## 2023-04-19 NOTE — Assessment & Plan Note (Signed)
-  Doing well with simvastatin, continue.  Update lipid panel today.

## 2023-04-19 NOTE — Assessment & Plan Note (Signed)
Blood pressure is well-controlled.  He will continue current medications for management of hypertension.

## 2023-04-19 NOTE — Assessment & Plan Note (Signed)
Lavage completed today.  Tolerated well.

## 2023-04-20 LAB — CBC WITH DIFFERENTIAL/PLATELET
Basophils Absolute: 0 10*3/uL (ref 0.0–0.2)
Basos: 1 %
EOS (ABSOLUTE): 0.2 10*3/uL (ref 0.0–0.4)
Eos: 4 %
Hematocrit: 44.3 % (ref 37.5–51.0)
Hemoglobin: 14.6 g/dL (ref 13.0–17.7)
Immature Grans (Abs): 0 10*3/uL (ref 0.0–0.1)
Immature Granulocytes: 0 %
Lymphocytes Absolute: 1.1 10*3/uL (ref 0.7–3.1)
Lymphs: 25 %
MCH: 30.7 pg (ref 26.6–33.0)
MCHC: 33 g/dL (ref 31.5–35.7)
MCV: 93 fL (ref 79–97)
Monocytes Absolute: 0.3 10*3/uL (ref 0.1–0.9)
Monocytes: 8 %
Neutrophils Absolute: 2.7 10*3/uL (ref 1.4–7.0)
Neutrophils: 62 %
Platelets: 187 10*3/uL (ref 150–450)
RBC: 4.76 x10E6/uL (ref 4.14–5.80)
RDW: 13.6 % (ref 11.6–15.4)
WBC: 4.3 10*3/uL (ref 3.4–10.8)

## 2023-04-20 LAB — CMP14+EGFR
ALT: 27 IU/L (ref 0–44)
AST: 24 IU/L (ref 0–40)
Albumin: 4.5 g/dL (ref 3.9–4.9)
Alkaline Phosphatase: 61 IU/L (ref 44–121)
BUN/Creatinine Ratio: 11 (ref 10–24)
BUN: 15 mg/dL (ref 8–27)
Bilirubin Total: 0.9 mg/dL (ref 0.0–1.2)
CO2: 22 mmol/L (ref 20–29)
Calcium: 10.2 mg/dL (ref 8.6–10.2)
Chloride: 103 mmol/L (ref 96–106)
Creatinine, Ser: 1.34 mg/dL — ABNORMAL HIGH (ref 0.76–1.27)
Globulin, Total: 2.3 g/dL (ref 1.5–4.5)
Glucose: 91 mg/dL (ref 70–99)
Potassium: 4.2 mmol/L (ref 3.5–5.2)
Sodium: 140 mmol/L (ref 134–144)
Total Protein: 6.8 g/dL (ref 6.0–8.5)
eGFR: 58 mL/min/{1.73_m2} — ABNORMAL LOW (ref >59)

## 2023-04-20 LAB — LIPID PANEL WITH LDL/HDL RATIO
Cholesterol, Total: 166 mg/dL (ref 100–199)
HDL: 46 mg/dL (ref >39)
LDL Chol Calc (NIH): 95 mg/dL (ref 0–99)
LDL/HDL Ratio: 2.1 ratio (ref 0.0–3.6)
Triglycerides: 143 mg/dL (ref 0–149)
VLDL Cholesterol Cal: 25 mg/dL (ref 5–40)

## 2023-04-20 LAB — MICROALBUMIN / CREATININE URINE RATIO
Creatinine, Urine: 163.9 mg/dL
Microalb/Creat Ratio: 185 mg/g{creat} — ABNORMAL HIGH (ref 0–29)
Microalbumin, Urine: 303.7 ug/mL

## 2023-04-21 ENCOUNTER — Ambulatory Visit: Payer: Medicare Other | Admitting: Clinical

## 2023-04-21 NOTE — Progress Notes (Unsigned)
                Jenna L Mendelson, PhD 

## 2023-05-04 ENCOUNTER — Telehealth: Payer: Self-pay | Admitting: Family Medicine

## 2023-05-04 DIAGNOSIS — H6121 Impacted cerumen, right ear: Secondary | ICD-10-CM

## 2023-05-04 NOTE — Telephone Encounter (Signed)
Patient is requesting a referral for an ENT doctor for ear issues.

## 2023-05-04 NOTE — Telephone Encounter (Signed)
Referral entered

## 2023-05-16 NOTE — Progress Notes (Unsigned)
PATIENT: Tanner Young DOB: Dec 24, 1955  REASON FOR VISIT: follow up HISTORY FROM: patient  No chief complaint on file.    HISTORY OF PRESENT ILLNESS:  05/16/23 ALL:  Tanner Young returns for follow up for OSA on CPAP.   His current machine is greater than 69 years old.   05/17/2022 ALL: Tanner Young is a 67 y.o. male here today for follow up for OSA on CPAP.  He continues to do well. He is using CPAP nightly for about 5-6 hours. He denies concerns with machine. He has noted more air leaks at home. He is using FFM. He does have facial hair. He usually changes mask about every 3 months. Machine was set up around February/March 2019.     04/28/21 ALL (mychart): Tanner Young is a 67 y.o. male here today for follow up for OSA on CPAP. He has been doing fairly well with CPAP therapy. He admits to lower compliance due to recent diagnosis of prostate cancer. He is status post robotic prostatectomy 02/03/2021 with [facility]. He is participating in pelvic floor rehab. He has follow up next month. He is getting back into a more consistent schedule and using CPAP most every night. He denies concerns with machine or supplies.     History (copied from Dr Teofilo Pod previous note)  Mr. Boehnke is a 67 year old right-handed gentleman with an underlying medical history of hypertension, reflux disease, chronic low back pain, arthritis, glaucoma, and obesity, who presents for follow-up consultation of his obstructive sleep apnea, on CPAP therapy. The patient is unaccompanied today, his wife is on speaker phone. I last saw him on 04/25/2019, at which time he was fully compliant with CPAP and doing well.  He was working on weight loss.  Since he had lost some weight, I suggested we reduce his treatment pressure from 16 cm to 14 cm at the time. He was advised to follow-up routinely in 1 year.   Today, 04/28/2020: I reviewed his CPAP compliance data from 03/25/2020 through 04/23/2020, which is a total of 30 days,  during which time he used his machine every night with percent use days greater than 4 hours at 100%, indicating superb compliance, average usage of 6 hours and 7 minutes, residual AHI borderline at 4.5/h, pressure at 14 cm with EPR of 3, 95th percentile of leak at 13.9 L/min.  He reports generally doing well.  He has had some weight fluctuation, some weight gain compared to last year.  He is compliant with treatment but is wondering if he should get reevaluated for the need of CPAP therapy.  His wife is on the speaker phone.  He is up-to-date with his supplies, he uses a full facemask, has changed his supplies on a regular basis.  He has had no new changes in his medication list or medical history.  Had a checkup with his primary care physician for his yearly physical.   REVIEW OF SYSTEMS: Out of a complete 14 system review of symptoms, the patient complains only of the following symptoms, none and all other reviewed systems are negative.    ALLERGIES: Allergies  Allergen Reactions   Shellfish Allergy Itching and Nausea And Vomiting    Shrimp    Lisinopril Cough    HOME MEDICATIONS: Outpatient Medications Prior to Visit  Medication Sig Dispense Refill   amLODipine (NORVASC) 5 MG tablet Take 5 mg by mouth daily.     benazepril (LOTENSIN) 40 MG tablet Take 40 mg by mouth daily.  blood glucose meter kit and supplies KIT Dispense based on patient and insurance preference. Use daily as directed to monitor blood sugar. (FOR ICD-9 250.00, 250.01). 1 each 0   brimonidine (ALPHAGAN) 0.15 % ophthalmic solution Place 1 drop into both eyes 2 (two) times daily at 10 AM and 5 PM.     cetirizine (ZYRTEC) 10 MG tablet Take 10 mg by mouth daily. As needed.     cholecalciferol (VITAMIN D) 1000 UNITS tablet Take 1,000 Units by mouth daily.     desonide (DESOWEN) 0.05 % cream Apply topically 2 (two) times daily.     diazepam (VALIUM) 10 MG tablet TAKE 1 TABLET BY MOUTH ONCE FOR 1 DOSE. TAKE 1 HOURS PRIOR  TO DENTAL PROCEDURE 5 tablet 0   dorzolamide-timolol (COSOPT) 22.3-6.8 MG/ML ophthalmic solution Place 1 drop into both eyes 2 (two) times daily.      Easy Touch Lancets 26G MISC Use as directed daily 100 each 1   empagliflozin (JARDIANCE) 25 MG TABS tablet Take 0.5 tablets by mouth daily.     famotidine (PEPCID) 20 MG tablet TAKE ONE TABLET BY MOUTH DAILY FOR GERD     fluocinonide (LIDEX) 0.05 % external solution APPLY SMALL AMOUNT TO AFFECTED AREA EVERY 7 DAYS AS NEEDED FOR SCALP ITCH     fluticasone (FLONASE) 50 MCG/ACT nasal spray INSTILL 1 SPRAY IN EACH NOSTRIL TWICE A DAY     gabapentin (NEURONTIN) 300 MG capsule Take 600 mg by mouth. Takes 2 in the morning and 2 at bedtime     glucose blood (PRECISION XTRA TEST STRIPS) test strip Use as directed daily. 100 each 1   ketoconazole (NIZORAL) 2 % cream Apply 1 application topically daily.     Latanoprostene Bunod 0.024 % SOLN Only uses for left eye once a day     metFORMIN (GLUCOPHAGE-XR) 500 MG 24 hr tablet Take 500 mg by mouth daily with breakfast.     Netarsudil Dimesylate 0.02 % SOLN Apply to eye.     nystatin-triamcinolone ointment (MYCOLOG) APPLY MODERATE AMOUNT TO AFFECTED AREA TWICE A DAY     omeprazole (PRILOSEC) 20 MG capsule Take 20 mg by mouth at bedtime.     PRESCRIPTION MEDICATION Apply 1 application topically daily. Cream to feet      PRESCRIPTION MEDICATION Apply 1 application topically daily. Cream to back      Semaglutide, 1 MG/DOSE, 4 MG/3ML SOPN Inject 1 mg as directed once a week. 9 mL 1   sildenafil (VIAGRA) 100 MG tablet Take 100 mg by mouth daily as needed for erectile dysfunction.     simvastatin (ZOCOR) 20 MG tablet Take 10 mg by mouth at bedtime.     terbinafine (LAMISIL) 1 % cream Apply 1 application topically 2 (two) times daily.     triamcinolone ointment (KENALOG) 0.1 % Apply 1 application topically 2 (two) times daily.     triamterene-hydrochlorothiazide (MAXZIDE) 75-50 MG per tablet Take 0.5 tablets by mouth  daily.     No facility-administered medications prior to visit.    PAST MEDICAL HISTORY: Past Medical History:  Diagnosis Date   Arthritis    Chronic back pain    Chronic kidney disease    Cough    Diabetes mellitus type II, controlled (HCC)    Dry skin    Floaters in visual field    GERD (gastroesophageal reflux disease)    Glaucoma, both eyes    Hay fever    Heartburn    Hyperlipemia  Hypertension    Itching    Joint pain    Kidney disease    Lactose intolerance    Leg cramp    Low back pain    Multiple food allergies    Muscle pain    Neuropathy    rt leg from DDD   Prostate cancer (HCC)    Rash    Sleep apnea    uses a cpap   Stiff neck    Swelling of lower extremity    Trouble in sleeping    Vision changes    Vitamin D deficiency    Wears glasses    Wears partial dentures    top mid partial-flipper    PAST SURGICAL HISTORY: Past Surgical History:  Procedure Laterality Date   COLONOSCOPY     DENTAL SURGERY     extractions   EYE SURGERY     ORIF PATELLA Right 01/24/2013   Procedure: OPEN REDUCTION INTERNAL (ORIF) FIXATION PATELLA;  Surgeon: Nestor Lewandowsky, MD;  Location: [facility];  Service: Orthopedics;  Laterality: Right;  orif right patella,patellectomy    PROSTATECTOMY  02/03/2021   [facility]    FAMILY HISTORY: Family History  Problem Relation Age of Onset   Arthritis Mother    Hearing loss Mother    Hyperlipidemia Mother    Hypertension Mother    Diabetes Mother    Cancer Father    Hypertension Father    Diabetes Brother    Hyperlipidemia Brother    Hypertension Brother    Breast cancer Neg Hx    Prostate cancer Neg Hx    Colon cancer Neg Hx    Pancreatic cancer Neg Hx     SOCIAL HISTORY: Social History   Socioeconomic History   Marital status: Married    Spouse name: [name]   Number of children: 5   Years of education: 13   Highest education level: Some Young, no degree  Occupational History    Comment:  retired  Tobacco Use   Smoking status: Never   Smokeless tobacco: Never  Vaping Use   Vaping status: Never Used  Substance and Sexual Activity   Alcohol use: No   Drug use: No   Sexual activity: Not Currently  Other Topics Concern   Not on file  Social History Narrative   Lives with his wife. He enjoys walking everyday.   Social Determinants of Health   Financial Resource Strain: Low Risk  (01/13/2023)   Overall Financial Resource Strain (CARDIA)    Difficulty of Paying Living Expenses: Not very hard  Food Insecurity: No Food Insecurity (01/13/2023)   Hunger Vital Sign    Worried About Running Out of Food in the Last Year: Never true    Ran Out of Food in the Last Year: Never true  Transportation Needs: No Transportation Needs (01/13/2023)   PRAPARE - Administrator, Civil Service (Medical): No    Lack of Transportation (Non-Medical): No  Physical Activity: Insufficiently Active (01/13/2023)   Exercise Vital Sign    Days of Exercise per Week: 2 days    Minutes of Exercise per Session: 50 min  Stress: No Stress Concern Present (01/13/2023)   [employer] of Occupational Health - Occupational Stress Questionnaire    Feeling of Stress : Only a little  Social Connections: Socially Integrated (01/13/2023)   Social Connection and Isolation Panel [NHANES]    Frequency of Communication with Friends and Family: More than three times a week  Frequency of Social Gatherings with Friends and Family: Twice a week    Attends Religious Services: More than 4 times per year    Active Member of [employer] or Organizations: Yes    Attends Banker Meetings: 1 to 4 times per year    Marital Status: Married  Catering manager Violence: Not At Risk (10/08/2022)   Humiliation, Afraid, Rape, and Kick questionnaire    Fear of Current or Ex-Partner: No    Emotionally Abused: No    Physically Abused: No    Sexually Abused: No     PHYSICAL EXAM  There were no vitals filed  for this visit.  There is no height or weight on file to calculate BMI.  Generalized: Well developed, in no acute distress  Cardiology: normal rate and rhythm, no murmur noted Respiratory: clear to auscultation bilaterally  Neurological examination  Mentation: Alert oriented to time, place, history taking. Follows all commands speech and language fluent Cranial nerve II-XII: Pupils were equal round reactive to light. Extraocular movements were full, visual field were full  Motor: The motor testing reveals 5 over 5 strength of all 4 extremities. Good symmetric motor tone is noted throughout.  Gait and station: Gait is normal.    DIAGNOSTIC DATA (LABS, IMAGING, TESTING) - I reviewed patient records, labs, notes, testing and imaging myself where available.      No data to display           Lab Results  Component Value Date   WBC 4.3 04/19/2023   HGB 14.6 04/19/2023   HCT 44.3 04/19/2023   MCV 93 04/19/2023   PLT 187 04/19/2023      Component Value Date/Time   NA 140 04/19/2023 1000   K 4.2 04/19/2023 1000   CL 103 04/19/2023 1000   CO2 22 04/19/2023 1000   GLUCOSE 91 04/19/2023 1000   GLUCOSE 101 (H) 06/08/2019 0850   BUN 15 04/19/2023 1000   CREATININE 1.34 (H) 04/19/2023 1000   CALCIUM 10.2 04/19/2023 1000   PROT 6.8 04/19/2023 1000   ALBUMIN 4.5 04/19/2023 1000   AST 24 04/19/2023 1000   ALT 27 04/19/2023 1000   ALKPHOS 61 04/19/2023 1000   BILITOT 0.9 04/19/2023 1000   Lab Results  Component Value Date   CHOL 166 04/19/2023   HDL 46 04/19/2023   LDLCALC 95 04/19/2023   TRIG 143 04/19/2023   CHOLHDL 3 06/08/2019   Lab Results  Component Value Date   HGBA1C 5.6 04/19/2023   No results found for: "VITAMINB12" Lab Results  Component Value Date   TSH 2.28 09/13/2018     ASSESSMENT AND PLAN 67 y.o. year old male  has a past medical history of Arthritis, Chronic back pain, Chronic kidney disease, Cough, Diabetes mellitus type II, controlled (HCC), Dry  skin, Floaters in visual field, GERD (gastroesophageal reflux disease), Glaucoma, both eyes, Hay fever, Heartburn, Hyperlipemia, Hypertension, Itching, Joint pain, Kidney disease, Lactose intolerance, Leg cramp, Low back pain, Multiple food allergies, Muscle pain, Neuropathy, Prostate cancer (HCC), Rash, Sleep apnea, Stiff neck, Swelling of lower extremity, Trouble in sleeping, Vision changes, Vitamin D deficiency, Wears glasses, and Wears partial dentures. here with   No diagnosis found.    Tanner Young is doing well on CPAP therapy. Compliance report reveals excellent compliance. He was encouraged to continue using CPAP nightly and for greater than 4 hours each night. He will monitor for leak at home. I have encouraged him to change out mask every 30 days.  AHI slightly elevated, today. Will monitor closely. He will be eligible for a new machine early 2024. He will reach out when ready to get a new machine. We will update supply orders as indicated. Risks of untreated sleep apnea review and education materials provided. Healthy lifestyle habits encouraged. He will follow up in 1 year, sooner if needed. He verbalizes understanding and agreement with this plan.    No orders of the defined types were placed in this encounter.    No orders of the defined types were placed in this encounter.     Shawnie Dapper, FNP-C 05/16/2023, 3:45 PM [facility] [address] [place], [place] [address] [phone]

## 2023-05-16 NOTE — Patient Instructions (Signed)

## 2023-05-18 ENCOUNTER — Encounter: Payer: Self-pay | Admitting: Family Medicine

## 2023-05-18 ENCOUNTER — Ambulatory Visit (INDEPENDENT_AMBULATORY_CARE_PROVIDER_SITE_OTHER): Payer: Medicare Other | Admitting: Family Medicine

## 2023-05-18 VITALS — BP 127/73 | HR 58 | Ht 70.0 in | Wt 263.4 lb

## 2023-05-18 DIAGNOSIS — G4733 Obstructive sleep apnea (adult) (pediatric): Secondary | ICD-10-CM

## 2023-06-08 ENCOUNTER — Ambulatory Visit: Payer: Medicare Other | Admitting: Clinical

## 2023-06-08 DIAGNOSIS — F4323 Adjustment disorder with mixed anxiety and depressed mood: Secondary | ICD-10-CM

## 2023-06-08 DIAGNOSIS — F4322 Adjustment disorder with anxiety: Secondary | ICD-10-CM | POA: Diagnosis not present

## 2023-06-08 NOTE — Progress Notes (Signed)
Time: 12:01 pm-12:55 pm CPT Code: [id number] Diagnosis Code: F43.22  [name] was seen remotely using secure video conferencing. He was in his home and therapist was in her office at time of appointment. Client is aware of risks of telehealth and consented to a virtual visit. He had continued to attend support groups and reported that he has become more comfortable going. He is awaiting his next round of bloodwork, and considering next steps based on the results. Therapist offered an opportunity to process. Therapist also engaged him in a review and update of his treatment plan, and Tanner Young provided input into and verbal consent to all goals and interventions. He is scheduled to be seen again in two months.   Treatment Plan Client Abilities/Strengths  Tanner Young described himself as willing to complete therapy appointment. He described himself as guarded in nature but willing to try to open up in his sessions.  Client Treatment Preferences:  None. He described himself as open to virtual or in-person therapy.  Client Statement of Needs  Tanner Young is seeking clarity in terms of his own emotional experience of his cancer diagnosis. He has noticed a bigger emotional response to his support group than to others groups he has participated in, and would like to unpack his emotional experience of this. Treatment Level  Monthly   Symptoms  Difficulty communicating about emotionally challenging topics, tendency to shut down and internalize Problems Addressed  Goals 1. List Goal. Add as many as needed Objective [name] would like to increase his openness to talking about emotionally challenging topics  Target Date: 02/26/2024 Frequency: Monthly  Progress: 70% Modality: Individual  Related Interventions 1. Caryn Bee will be provided with opportunities to process his experiences in session 2.  Therapist will help Tanner Young to notice and disengage from maladaptive thoughts nd behaviors using CBT-based strategies 3.  Therapist will provide emotion regulation strategies  including meditation, mindfulness, and general self-care 4. Therapist will provide referrals for additional resources as appropriate Objective Tanner Young would like to reduce his anxiety related to support groups he attends regularly, as well as in relation to day-to-day events  Target Date: 02/26/2024 Frequency: Monthly  Progress: 80% Modality: individual  Related Interventions Diagnosis Axis none Adjustment Disorder with Anxiety, F43.22   Axis none    Conditions For Discharge Achievement of treatment goals and objectives  Intake  Presenting Problem  Tanner Young shared that he was diagnosed with prostate cancer in February of 2022. He underwent surgery in June of 2022. This was reported to have gone well. He has participated in a support group for cancer survivors at [facility] once monthly. He reported that he has completed chemo and attends follow-up appointments every three months. He reported that he has felt overwhelmed hearing the accounts of fellow group members. He also experiences a long-standing back injury. He is not officially in remission, and is monitoring his status with medical providers. Symptoms  Social withdrawal, feelings of overwhelm, "what-if" thoughts, difficulty relaxing when scans are approaching, feelings of restlessness History of Problem   Tanner Young would like to process emotions related to his cancer diagnosis in February of 2022. He shared that he has experienced episodes of anxiety prior to this occasionally. Recent Trigger   Tanner Young was referred by his PCP, Dr. Everrett Coombe, after he shared feelings of overwhelm in relation to his cancer support group.   Marital and Family Information  Tanner Young lives with his wife of 36 years. He has five adult children, the youngest of whom is 82 and the oldest of  whom is 48.    Present family concerns/problems: Tanner Young shared that his adult daughter has had some psychiatric  difficulties. All of his children live independently and maintain employment. He also has a one-year-old grandchild born to his 41 year-old daughter.   Strengths/resources in the family/friends:  [name] described a supportive relationship with his wife, and he sometimes feels concerned for her stress levels. He has several close friendships and described them as a tight knit group.   Marital/sexual history patterns:    Family of Origin Tanner Young's mother is alive but his father has passed. He is the oldest of three, and has a brother and sister. He has several nephews and two nieces. [place] and joined the [employer] at 55.   Problems in family of origin: Tanner Young's father passed of brain cancer at 65 years of age. He was healthy until then. Family background / ethnic factors: Tanner Young has lived in [place] his whole life, but traveled during his time in the service. He was raised in the [facility].   No needs/concerns related to ethnicity reported when asked: No  Education/Vocation   Interpersonal concerns/problems: None.  Personal strengths:  Ability to get along with others Military/work problems/concerns: Tykese is retired. 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 Prior to that time, he had been working for the post office. Leisure Activities/Daily Functioning   Legal Status  No Legal Problems: No Medical/Nutritional Concerns   Prostate cancer diagnosed Feb of 2022, back problems, kidney problems. Tanner Young was diagnosed with kidney problems in 1999. He broke his kneecap several years prior to the initial intake. He has also undergone extensive dental work.   Comments:    Substance use/abuse/dependence: Occasional glass of wine, otherwise no substances   Comments:    Religion/Spirituality: Chritopher described himself as Ephriam Knuckles in the sense of wanting to treat others well and follow the golden rule. However, he reported that he does  not strictly adhere to anyone religious dogma.   General Behavior: WNL Attire: WNL Gait: not observed-telehealth Motor Activity: WNL Stream of Thought - Productivity: WNL Stream of thought - Progression: WNL Stream of thought - Language:  WNL Emotional tone and reactions - Mood: WNL  Emotional tone and reactions - Affect: WNL Mental trend/Content of thoughts - Perception: WNL  Mental trend/Content of thoughts - Orientation: WNL Mental trend/Content of thoughts - Memory: WNL Mental trend/Content of thoughts - General knowledge: WNL  Insight: Good Judgment: Good Intelligence: Average Mental Status Comment: Good Diagnostic Summary: F43.22, adjustment disorder with anxiety       Chrissie Noa, PhD               Chrissie Noa, PhD               Chrissie Noa, PhD

## 2023-06-14 ENCOUNTER — Telehealth: Payer: Self-pay | Admitting: Family Medicine

## 2023-06-14 NOTE — Telephone Encounter (Signed)
HST - [employer] medicare no auth req   Patient is scheduled at [facility] for 06/22/23 at 8:30 AM  Mailed packet to the patient.

## 2023-06-22 ENCOUNTER — Ambulatory Visit: Payer: Medicare Other | Admitting: Neurology

## 2023-06-22 DIAGNOSIS — G4733 Obstructive sleep apnea (adult) (pediatric): Secondary | ICD-10-CM | POA: Diagnosis not present

## 2023-06-23 NOTE — Progress Notes (Signed)
See procedure note.

## 2023-06-24 NOTE — Progress Notes (Signed)
Pharmacy Quality Measure Review  This patient is appearing on report for being at risk of failing the measure for Statin Use in Persons with Diabetes (SUPD) medications this calendar year.   Simvastatin 10 mg currently on patient's medication list. Last PCP note reported he has remained adherent to statin therapy. Unable to confirm fill history in EMR. I called the last 3 pharmacies he filled at at none had a fill history for simvastatin. Unclear where patient is getting supply. Attempted to call patient to discuss and confirm therapy but unable to reach him. Left voicemail.   The 10-year ASCVD risk score (Arnett DK, et al., 2019) is: 30%   Values used to calculate the score:     Age: 67 years     Sex: Male     Is Non-Hispanic African American: Yes     Diabetic: Yes     Tobacco smoker: No     Systolic Blood Pressure: 127 mmHg     Is BP treated: Yes     HDL Cholesterol: 46 mg/dL     Total Cholesterol: 166 mg/dL  Lipid Panel     Component Value Date/Time   CHOL 166 04/19/2023 1000   TRIG 143 04/19/2023 1000   HDL 46 04/19/2023 1000   CHOLHDL 3 06/08/2019 0850   VLDL 11.0 06/08/2019 0850   LDLCALC 95 04/19/2023 1000   LABVLDL 25 04/19/2023 1000     Sofie Rower, PharmD [employer] PGY-1

## 2023-06-27 NOTE — Procedures (Signed)
[facility]  HOME SLEEP TEST (Watch PAT) REPORT  STUDY DATE: 06/22/2023  DOB: 05-08-1956  MRN: [id number]  ORDERING CLINICIAN: Huston Foley, MD, PhD   REFERRING CLINICIAN: Shawnie Dapper, NP  CLINICAL INFORMATION/HISTORY: 67 year old male with an underlying medical history of hypertension, reflux disease, chronic low back pain, arthritis, glaucoma, and obesity, who presents for reevaluation of his obstructive sleep apnea.  He has been on PAP therapy for years.  He is compliant with treatment, currently on CPAP of 14 cm with EPR of 3 with good apnea control and good tolerance.  He should be eligible for new equipment.  Epworth sleepiness score: 7/24.  BMI: 37.6 kg/m  FINDINGS:   Sleep Summary:   Total Recording Time (hours, min): 8 hours, 34 min  Total Sleep Time (hours, min):  7 hours, 54 min  Percent REM (%):    38.6%   Respiratory Indices:   Calculated pAHI (per hour):  19.6/hour         REM pAHI:    31.8/hour       NREM pAHI: 11.9/hour  Central pAHI: 0/hour  Oxygen Saturation Statistics:    Oxygen Saturation (%) Mean: 93%   Minimum oxygen saturation (%):                 84%   O2 Saturation Range (%): 84 - 99%    O2 Saturation (minutes) <=88%: 4.2 min  Pulse Rate Statistics:   Pulse Mean (bpm):    58/min    Pulse Range (46 - 93/min)   IMPRESSION: OSA (obstructive sleep apnea), moderate   RECOMMENDATION:  This home sleep test demonstrates moderate obstructive sleep apnea with a total AHI of 19.6/hour and O2 nadir of 84%.  Intermittent and variable snoring was detected.  Ongoing treatment with a positive airway pressure (PAP) device is recommended. The patient has been on CPAP of 14 cm with EPR of 3 with good tolerance and ongoing good results, good compliance and good apnea control.  He should be eligible for new equipment, I recommend continuing with CPAP of 14 cm, prescription for a new machine and new supplies. A full night titration  study may be considered to optimize treatment settings, monitor proper oxygen saturations and aid with improvement of tolerance and adherence, if needed down the road. Alternative treatment options may include a dental device through dentistry or orthodontics in selected patients or Inspire (hypoglossal nerve stimulator) in carefully selected patients (meeting inclusion criteria).  Concomitant weight loss is recommended (where clinically appropriate). Please note that untreated obstructive sleep apnea may carry additional perioperative morbidity. Patients with significant obstructive sleep apnea should receive perioperative PAP therapy and the surgeons and particularly the anesthesiologist should be informed of the diagnosis and the severity of the sleep disordered breathing. The patient should be cautioned not to drive, work at heights, or operate dangerous or heavy equipment when tired or sleepy. Review and reiteration of good sleep hygiene measures should be pursued with any patient. Other causes of the patient's symptoms, including circadian rhythm disturbances, an underlying mood disorder, medication effect and/or an underlying medical problem cannot be ruled out based on this test. Clinical correlation is recommended.  The patient and his referring provider will be notified of the test results. The patient will be seen in follow up in sleep clinic at [facility].  I certify that I have reviewed the raw data recording prior to the issuance of this report in accordance with the standards of the American Academy of Sleep Medicine (  AASM).    INTERPRETING PHYSICIAN:   Huston Foley, MD, PhD Medical Director, [facility]) Diplomat, ABPN (Neurology and Sleep)   [facility] [address] [place], [place] [address] [phone]

## 2023-06-28 ENCOUNTER — Other Ambulatory Visit: Payer: Self-pay | Admitting: Family Medicine

## 2023-06-28 DIAGNOSIS — G4733 Obstructive sleep apnea (adult) (pediatric): Secondary | ICD-10-CM

## 2023-07-12 ENCOUNTER — Telehealth: Payer: Self-pay | Admitting: Family Medicine

## 2023-07-12 DIAGNOSIS — G4733 Obstructive sleep apnea (adult) (pediatric): Secondary | ICD-10-CM

## 2023-07-12 NOTE — Telephone Encounter (Signed)
autoPAP ordered, please send to DME.

## 2023-07-12 NOTE — Telephone Encounter (Signed)
Called pt. He spoke w/ Amy Lomax,NP at visit 05/18/23 about changing from set pressure 14cm to range of pressure with new machine. He would like to switch to this. Aware Amy out and will send to Dr. Frances Furbish to review to see if ok to change to range of pressure and if so, what settings.    He got new CPAP machine yesterday. Scheduled initial CPAP f/u w/ new machine 10/05/23 at 9am with AL,NP.

## 2023-07-12 NOTE — Telephone Encounter (Signed)
Called pt back. LVM letting him know Dr. Frances Furbish ordered autopap pressure setting of 9-15cm. I have sent community message to [employer] that order placed.

## 2023-07-12 NOTE — Telephone Encounter (Signed)
Pt said [facility] said the prescription came in to be a set pressure. I was under to impression that new CPAP at variant pressure. Wouild like for CPAP to  be variant pressure. Would like  a call from the nurse.

## 2023-07-21 ENCOUNTER — Ambulatory Visit: Payer: Medicare Other | Admitting: Clinical

## 2023-07-21 NOTE — Progress Notes (Unsigned)
                Casie Sturgeon L Tank Difiore, PhD 

## 2023-07-27 ENCOUNTER — Ambulatory Visit (INDEPENDENT_AMBULATORY_CARE_PROVIDER_SITE_OTHER): Payer: Medicare Other | Admitting: Clinical

## 2023-07-27 DIAGNOSIS — F4322 Adjustment disorder with anxiety: Secondary | ICD-10-CM

## 2023-07-27 NOTE — Progress Notes (Signed)
Time: 9:01 am-9:56 am CPT Code: 56213Y-86 Diagnosis Code: F43.22  Yechiel was seen remotely using secure video conferencing. He was in his home and therapist was in her office at time of appointment. Client is aware of risks of telehealth and consented to a virtual visit. He reported that he had missed his last appointment due to having learned that he would need dental and eye surgery. Session focused on processing this development, including working with him to consider his best options and cope with the upcoming surgeries. He is scheduled to be seen again in 6 weeks and will reach out if he needs to be seen sooner.  Treatment Plan Client Abilities/Strengths  Haran described himself as willing to complete therapy appointment. He described himself as guarded in nature but willing to try to open up in his sessions.  Client Treatment Preferences:  None. He described himself as open to virtual or in-person therapy.  Client Statement of Needs  Joesph is seeking clarity in terms of his own emotional experience of his cancer diagnosis. He has noticed a bigger emotional response to his support group than to others groups he has participated in, and would like to unpack his emotional experience of this. Treatment Level  Monthly   Symptoms  Difficulty communicating about emotionally challenging topics, tendency to shut down and internalize Problems Addressed  Goals 1. List Goal. Add as many as needed Objective Selestino would like to increase his openness to talking about emotionally challenging topics  Target Date: 02/26/2024 Frequency: Monthly  Progress: 70% Modality: Individual  Related Interventions 1. Caryn Bee will be provided with opportunities to process his experiences in session 2.  Therapist will help Grayer to notice and disengage from maladaptive thoughts nd behaviors using CBT-based strategies 3. Therapist will provide emotion regulation strategies  including meditation, mindfulness, and  general self-care 4. Therapist will provide referrals for additional resources as appropriate Objective Jasson would like to reduce his anxiety related to support groups he attends regularly, as well as in relation to day-to-day events  Target Date: 02/26/2024 Frequency: Monthly  Progress: 80% Modality: individual  Related Interventions Diagnosis Axis none Adjustment Disorder with Anxiety, F43.22   Axis none    Conditions For Discharge Achievement of treatment goals and objectives  Intake  Presenting Problem  Nikolas shared that he was diagnosed with prostate cancer in February of 2022. He underwent surgery in June of 2022. This was reported to have gone well. He has participated in a support group for cancer survivors at [facility] once monthly. He reported that he has completed chemo and attends follow-up appointments every three months. He reported that he has felt overwhelmed hearing the accounts of fellow group members. He also experiences a long-standing back injury. He is not officially in remission, and is monitoring his status with medical providers. Symptoms  Social withdrawal, feelings of overwhelm, "what-if" thoughts, difficulty relaxing when scans are approaching, feelings of restlessness History of Problem   Kush would like to process emotions related to his cancer diagnosis in February of 2022. He shared that he has experienced episodes of anxiety prior to this occasionally. Recent Trigger   Daric was referred by his PCP, Dr. Everrett Coombe, after he shared feelings of overwhelm in relation to his cancer support group.   Marital and Family Information  Yacqub lives with his wife of 36 years. He has five adult children, the youngest of whom is 11 and the oldest of whom is 42.    Present family concerns/problems: Clary shared that  his adult daughter has had some psychiatric difficulties. All of his children live independently and maintain employment. He also has a  one-year-old grandchild born to his 18 year-old daughter.   Strengths/resources in the family/friends:  Kaison described a supportive relationship with his wife, and he sometimes feels concerned for her stress levels. He has several close friendships and described them as a tight knit group.   Marital/sexual history patterns:    Family of Origin Izaih's mother is alive but his father has passed. He is the oldest of three, and has a brother and sister. He has several nephews and two nieces. [place] and joined the [employer] at 43.   Problems in family of origin: Koy's father passed of brain cancer at 25 years of age. He was healthy until then. Family background / ethnic factors: Zishan has lived in [place] his whole life, but traveled during his time in the service. He was raised in the [facility].   No needs/concerns related to ethnicity reported when asked: No  Education/Vocation   Interpersonal concerns/problems: None.  Personal strengths:  Ability to get along with others Military/work problems/concerns: Santi is retired. 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 Prior to that time, he had been working for the post office. Leisure Activities/Daily Functioning   Legal Status  No Legal Problems: No Medical/Nutritional Concerns   Prostate cancer diagnosed Feb of 2022, back problems, kidney problems. Torre was diagnosed with kidney problems in 1999. He broke his kneecap several years prior to the initial intake. He has also undergone extensive dental work.   Comments:    Substance use/abuse/dependence: Occasional glass of wine, otherwise no substances   Comments:    Religion/Spirituality: Ankit described himself as Ephriam Knuckles in the sense of wanting to treat others well and follow the golden rule. However, he reported that he does not strictly adhere to anyone religious dogma.   General Behavior: WNL Attire: WNL Gait:  not observed-telehealth Motor Activity: WNL Stream of Thought - Productivity: WNL Stream of thought - Progression: WNL Stream of thought - Language:  WNL Emotional tone and reactions - Mood: WNL  Emotional tone and reactions - Affect: WNL Mental trend/Content of thoughts - Perception: WNL  Mental trend/Content of thoughts - Orientation: WNL Mental trend/Content of thoughts - Memory: WNL Mental trend/Content of thoughts - General knowledge: WNL  Insight: Good Judgment: Good Intelligence: Average Mental Status Comment: Good Diagnostic Summary: F43.22, adjustment disorder with anxiety       Chrissie Noa, PhD                Chrissie Noa, PhD               Chrissie Noa, PhD

## 2023-08-08 ENCOUNTER — Encounter: Payer: Self-pay | Admitting: Family Medicine

## 2023-09-07 ENCOUNTER — Telehealth: Payer: Self-pay | Admitting: Family Medicine

## 2023-09-07 NOTE — Telephone Encounter (Signed)
 Pt called to confirm appointment details

## 2023-09-13 ENCOUNTER — Ambulatory Visit: Payer: Medicare Other | Admitting: Clinical

## 2023-09-13 DIAGNOSIS — F4322 Adjustment disorder with anxiety: Secondary | ICD-10-CM | POA: Diagnosis not present

## 2023-09-13 NOTE — Progress Notes (Signed)
Time: 1:01 pm-1:50 pm CPT Code: 84696E-95 Diagnosis Code: F43.22  Nathaneal was seen remotely using secure video conferencing. He was in his home and therapist was in her office at time of appointment. Client is aware of risks of telehealth and consented to a virtual visit. He reported that he has been doing well overall. He and his wife have been focused on cleaning out their house, and are considering moving to [place]. He had sought second opinions regarding the two surgeries he had been advised to pursue, and had opted to postpone both based on what he had learned. He reported a stable mood overall, with some anxiety related to his upcoming appointment at [facility]. He continues to attend support group meetings. He is scheduled to be seen again in 6 weeks.  Treatment Plan Client Abilities/Strengths  Arnel described himself as willing to complete therapy appointment. He described himself as guarded in nature but willing to try to open up in his sessions.  Client Treatment Preferences:  None. He described himself as open to virtual or in-person therapy.  Client Statement of Needs  Dontavis is seeking clarity in terms of his own emotional experience of his cancer diagnosis. He has noticed a bigger emotional response to his support group than to others groups he has participated in, and would like to unpack his emotional experience of this. Treatment Level  Monthly   Symptoms  Difficulty communicating about emotionally challenging topics, tendency to shut down and internalize Problems Addressed  Goals 1. List Goal. Add as many as needed Objective Natan would like to increase his openness to talking about emotionally challenging topics  Target Date: 02/26/2024 Frequency: Monthly  Progress: 70% Modality: Individual  Related Interventions 1. Caryn Bee will be provided with opportunities to process his experiences in session 2.  Therapist will help Reon to notice and disengage from maladaptive thoughts  nd behaviors using CBT-based strategies 3. Therapist will provide emotion regulation strategies  including meditation, mindfulness, and general self-care 4. Therapist will provide referrals for additional resources as appropriate Objective Vedanth would like to reduce his anxiety related to support groups he attends regularly, as well as in relation to day-to-day events  Target Date: 02/26/2024 Frequency: Monthly  Progress: 80% Modality: individual  Related Interventions Diagnosis Axis none Adjustment Disorder with Anxiety, F43.22   Axis none    Conditions For Discharge Achievement of treatment goals and objectives  Intake  Presenting Problem  Taji shared that he was diagnosed with prostate cancer in February of 2022. He underwent surgery in June of 2022. This was reported to have gone well. He has participated in a support group for cancer survivors at [facility] once monthly. He reported that he has completed chemo and attends follow-up appointments every three months. He reported that he has felt overwhelmed hearing the accounts of fellow group members. He also experiences a long-standing back injury. He is not officially in remission, and is monitoring his status with medical providers. Symptoms  Social withdrawal, feelings of overwhelm, "what-if" thoughts, difficulty relaxing when scans are approaching, feelings of restlessness History of Problem   Treavor would like to process emotions related to his cancer diagnosis in February of 2022. He shared that he has experienced episodes of anxiety prior to this occasionally. Recent Trigger   Sherrel was referred by his PCP, Dr. Everrett Coombe, after he shared feelings of overwhelm in relation to his cancer support group.   Marital and Family Information  Dvante lives with his wife of 36 years. He has five  adult children, the youngest of whom is 47 and the oldest of whom is 63.    Present family concerns/problems: Jayleon shared that his adult  daughter has had some psychiatric difficulties. All of his children live independently and maintain employment. He also has a one-year-old grandchild born to his 60 year-old daughter.   Strengths/resources in the family/friends:  Breanna described a supportive relationship with his wife, and he sometimes feels concerned for her stress levels. He has several close friendships and described them as a tight knit group.   Marital/sexual history patterns:    Family of Origin Grantham's mother is alive but his father has passed. He is the oldest of three, and has a brother and sister. He has several nephews and two nieces. [place] and joined the [employer] at 43.   Problems in family of origin: Ahsan's father passed of brain cancer at 80 years of age. He was healthy until then. Family background / ethnic factors: Jordyn has lived in [place] his whole life, but traveled during his time in the service. He was raised in the [facility].   No needs/concerns related to ethnicity reported when asked: No  Education/Vocation   Interpersonal concerns/problems: None.  Personal strengths:  Ability to get along with others Military/work problems/concerns: Sonny is retired. 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 Prior to that time, he had been working for the post office. Leisure Activities/Daily Functioning   Legal Status  No Legal Problems: No Medical/Nutritional Concerns   Prostate cancer diagnosed Feb of 2022, back problems, kidney problems. Thanh was diagnosed with kidney problems in 1999. He broke his kneecap several years prior to the initial intake. He has also undergone extensive dental work.   Comments:    Substance use/abuse/dependence: Occasional glass of wine, otherwise no substances   Comments:    Religion/Spirituality: Tykeem described himself as Ephriam Knuckles in the sense of wanting to treat others well and follow the golden rule.  However, he reported that he does not strictly adhere to anyone religious dogma.   General Behavior: WNL Attire: WNL Gait: not observed-telehealth Motor Activity: WNL Stream of Thought - Productivity: WNL Stream of thought - Progression: WNL Stream of thought - Language:  WNL Emotional tone and reactions - Mood: WNL  Emotional tone and reactions - Affect: WNL Mental trend/Content of thoughts - Perception: WNL  Mental trend/Content of thoughts - Orientation: WNL Mental trend/Content of thoughts - Memory: WNL Mental trend/Content of thoughts - General knowledge: WNL  Insight: Good Judgment: Good Intelligence: Average Mental Status Comment: Good Diagnostic Summary: F43.22, adjustment disorder with anxiety           Chrissie Noa, PhD               Chrissie Noa, PhD

## 2023-10-04 ENCOUNTER — Encounter: Payer: Self-pay | Admitting: *Deleted

## 2023-10-04 ENCOUNTER — Telehealth: Payer: Self-pay | Admitting: Family Medicine

## 2023-10-04 NOTE — Telephone Encounter (Signed)
Confirmed we can pull CPAP report. Snipped in chart for appt tomorrow. Also sent mychart to pt to complete ESS.

## 2023-10-04 NOTE — Progress Notes (Unsigned)
 [name]

## 2023-10-04 NOTE — Progress Notes (Unsigned)
   PATIENT: Tanner Young DOB: [birth year]  REASON FOR VISIT: follow up HISTORY FROM: patient  Virtual Visit via Telephone Note  I connected with Tanner Young on 10/05/23 at  9:00 AM EST by telephone and verified that I am speaking with the correct person using two identifiers.   I discussed the limitations, risks, security and privacy concerns of performing an evaluation and management service by telephone and the availability of in person appointments. I also discussed with the patient that there may be a patient responsible charge related to this service. The patient expressed understanding and agreed to proceed.   History of Present Illness:  10/04/23 ALL:  Tanner Young returns for follow up for OSA on CPAP. He was last seen 05/2023 and doing well but eligible for new machine. Repeat HST 06/2023 showed moderate obstructive sleep apnea with a total AHI of 19.6/hour and O2 nadir of 84%. He received new CPAP 07/2023. He reports doing well. He is using is machine nightly for about 5.5 hours, on average. He denies concerns with machine or supplies.     ESS 10/24  Observations/Objective:  Generalized: Well developed, in no acute distress  Mentation: Alert oriented to time, place, history taking. Follows all commands speech and language fluent   Assessment and Plan:  68 y.o. year old male  has a past medical history of Arthritis, Chronic back pain, Chronic kidney disease, Cough, Diabetes mellitus type II, controlled (HCC), Dry skin, Floaters in visual field, GERD (gastroesophageal reflux disease), Glaucoma, both eyes, Hay fever, Heartburn, Hyperlipemia, Hypertension, Itching, Joint pain, Kidney disease, Lactose intolerance, Leg cramp, Low back pain, Multiple food allergies, Muscle pain, Neuropathy, Prostate cancer (HCC), Rash, Sleep apnea, Stiff neck, Swelling of lower extremity, Trouble in sleeping, Vision changes, Vitamin D deficiency, Wears glasses, and Wears partial dentures. here  with    ICD-10-CM   1. OSA on CPAP  G47.33      Tanner Young is doing well with his new machine. Compliance report reveals excellent compliance. He was encouraged to continue using CPAP nightly for at least 4 hours. He will follow up with me in 1 year, sooner if needed.    No orders of the defined types were placed in this encounter.   No orders of the defined types were placed in this encounter.    Follow Up Instructions:  I discussed the assessment and treatment plan with the patient. The patient was provided an opportunity to ask questions and all were answered. The patient agreed with the plan and demonstrated an understanding of the instructions.   The patient was advised to call back or seek an in-person evaluation if the symptoms worsen or if the condition fails to improve as anticipated.  I provided 15 minutes of non-face-to-face time during this encounter. Patient located at their place of residence during Mychart visit. Provider is in the office.    Shawnie Dapper, NP

## 2023-10-04 NOTE — Patient Instructions (Signed)

## 2023-10-04 NOTE — Telephone Encounter (Signed)
 ..  Pt understands that although there may be some limitations with this type of visit, we will take all precautions to reduce any security or privacy concerns.  Pt understands that this will be treated like an in office visit and we will file with pt's insurance, and there may be a patient responsible charge related to this service. ? ?

## 2023-10-05 ENCOUNTER — Encounter: Payer: Self-pay | Admitting: Family Medicine

## 2023-10-05 ENCOUNTER — Telehealth: Payer: Self-pay | Admitting: Family Medicine

## 2023-10-05 ENCOUNTER — Telehealth: Payer: Medicare Other | Admitting: Family Medicine

## 2023-10-05 ENCOUNTER — Encounter: Payer: Self-pay | Admitting: *Deleted

## 2023-10-05 DIAGNOSIS — G4733 Obstructive sleep apnea (adult) (pediatric): Secondary | ICD-10-CM

## 2023-10-05 NOTE — Telephone Encounter (Signed)
Scheduled for 1 year f/u per notes from today's VV.

## 2023-10-11 ENCOUNTER — Ambulatory Visit (INDEPENDENT_AMBULATORY_CARE_PROVIDER_SITE_OTHER): Payer: Medicare Other

## 2023-10-11 VITALS — BP 108/62 | HR 61 | Ht 70.0 in | Wt 257.0 lb

## 2023-10-11 DIAGNOSIS — Z0000 Encounter for general adult medical examination without abnormal findings: Secondary | ICD-10-CM

## 2023-10-11 MED ORDER — SEMAGLUTIDE (1 MG/DOSE) 4 MG/3ML ~~LOC~~ SOPN: 1.0000 mg | PEN_INJECTOR | SUBCUTANEOUS | 1 refills | Status: DC

## 2023-10-11 NOTE — Progress Notes (Addendum)
 Subjective:   Tanner Young is a 68 y.o. male who presents for Medicare Annual/Subsequent preventive examination.  Visit Complete: In person  Patient Medicare AWV questionnaire was completed by the patient on 09/26/2023; I have confirmed that all information answered by patient is correct and no changes since this date.  Cardiac Risk Factors include: advanced age (>70men, >39 women);diabetes mellitus;male gender;obesity (BMI >30kg/m2);hypertension;dyslipidemia;family history of premature cardiovascular disease     Objective:    Today's Vitals   10/11/23 0809  BP: 108/62  Pulse: 61  Weight: 257 lb (116.6 kg)  Height: 5\' 10"  (1.778 m)   Body mass index is 36.88 kg/m.     10/11/2023    8:19 AM 10/08/2022    8:03 AM 04/13/2021   10:56 AM 10/28/2020    8:24 AM 01/23/2013    5:02 PM  Advanced Directives  Does Patient Have a Medical Advance Directive? Yes No No No Patient has advance directive, copy not in chart  Type of Advance Directive Healthcare Power of [place];Living will      Does patient want to make changes to medical advance directive? No - Patient declined      Copy of Healthcare Power of Attorney in Chart? No - copy requested      Would patient like information on creating a medical advance directive?  No - Patient declined No - Patient declined No - Patient declined     Current Medications (verified) Outpatient Encounter Medications as of 10/11/2023  Medication Sig   amLODipine (NORVASC) 5 MG tablet Take 5 mg by mouth daily.   benazepril (LOTENSIN) 40 MG tablet Take 40 mg by mouth daily.   blood glucose meter kit and supplies KIT Dispense based on patient and insurance preference. Use daily as directed to monitor blood sugar. (FOR ICD-9 250.00, 250.01).   brimonidine (ALPHAGAN) 0.15 % ophthalmic solution Place 1 drop into both eyes 2 (two) times daily at 10 AM and 5 PM.   cetirizine (ZYRTEC) 10 MG tablet Take 10 mg by mouth daily. As needed.   cholecalciferol  (VITAMIN D) 1000 UNITS tablet Take 1,000 Units by mouth daily.   desonide (DESOWEN) 0.05 % cream Apply topically 2 (two) times daily.   diazepam (VALIUM) 10 MG tablet TAKE 1 TABLET BY MOUTH ONCE FOR 1 DOSE. TAKE 1 HOURS PRIOR TO DENTAL PROCEDURE   dorzolamide-timolol (COSOPT) 22.3-6.8 MG/ML ophthalmic solution Place 1 drop into both eyes 2 (two) times daily.    Easy Touch Lancets 26G MISC Use as directed daily   empagliflozin (JARDIANCE) 25 MG TABS tablet Take 0.5 tablets by mouth daily.   famotidine (PEPCID) 20 MG tablet TAKE ONE TABLET BY MOUTH DAILY FOR GERD   fluocinonide (LIDEX) 0.05 % external solution APPLY SMALL AMOUNT TO AFFECTED AREA EVERY 7 DAYS AS NEEDED FOR SCALP ITCH   fluticasone (FLONASE) 50 MCG/ACT nasal spray INSTILL 1 SPRAY IN EACH NOSTRIL TWICE A DAY   gabapentin (NEURONTIN) 300 MG capsule Take 600 mg by mouth. Takes 2 in the morning and 2 at bedtime   glucose blood (PRECISION XTRA TEST STRIPS) test strip Use as directed daily.   ketoconazole (NIZORAL) 2 % cream Apply 1 application topically daily.   Latanoprostene Bunod 0.024 % SOLN Only uses for left eye once a day   Netarsudil Dimesylate 0.02 % SOLN Apply to eye.   nystatin-triamcinolone ointment (MYCOLOG) APPLY MODERATE AMOUNT TO AFFECTED AREA TWICE A DAY   omeprazole (PRILOSEC) 20 MG capsule Take 20 mg by mouth at bedtime.  PRESCRIPTION MEDICATION Apply 1 application topically daily. Cream to feet    PRESCRIPTION MEDICATION Apply 1 application topically daily. Cream to back    Semaglutide, 1 MG/DOSE, 4 MG/3ML SOPN Inject 1 mg as directed once a week.   sildenafil (VIAGRA) 100 MG tablet Take 100 mg by mouth daily as needed for erectile dysfunction.   simvastatin (ZOCOR) 20 MG tablet Take 10 mg by mouth at bedtime.   terbinafine (LAMISIL) 1 % cream Apply 1 application topically 2 (two) times daily.   triamcinolone ointment (KENALOG) 0.1 % Apply 1 application topically 2 (two) times daily.    triamterene-hydrochlorothiazide (MAXZIDE) 75-50 MG per tablet Take 0.5 tablets by mouth daily.   metFORMIN (GLUCOPHAGE-XR) 500 MG 24 hr tablet Take 500 mg by mouth daily with breakfast. (Patient not taking: Reported on 10/11/2023)   No facility-administered encounter medications on file as of 10/11/2023.    Allergies (verified) Shellfish allergy and Lisinopril   History: Past Medical History:  Diagnosis Date   Arthritis    Chronic back pain    Chronic kidney disease    Cough    Diabetes mellitus type II, controlled (HCC)    Dry skin    Floaters in visual field    GERD (gastroesophageal reflux disease)    Glaucoma, both eyes    Hay fever    Heartburn    Hyperlipemia    Hypertension    Itching    Joint pain    Kidney disease    Lactose intolerance    Leg cramp    Low back pain    Multiple food allergies    Muscle pain    Neuropathy    rt leg from DDD   Prostate cancer (HCC)    Rash    Sleep apnea    uses a cpap   Stiff neck    Swelling of lower extremity    Trouble in sleeping    Vision changes    Vitamin D deficiency    Wears glasses    Wears partial dentures    top mid partial-flipper   Past Surgical History:  Procedure Laterality Date   COLONOSCOPY     DENTAL SURGERY     extractions   EYE SURGERY     ORIF PATELLA Right 01/24/2013   Procedure: OPEN REDUCTION INTERNAL (ORIF) FIXATION PATELLA;  Surgeon: Nestor Lewandowsky, MD;  Location: [facility];  Service: Orthopedics;  Laterality: Right;  orif right patella,patellectomy    PROSTATECTOMY  02/03/2021   [facility]   Family History  Problem Relation Age of Onset   Arthritis Mother    Hearing loss Mother    Hyperlipidemia Mother    Hypertension Mother    Diabetes Mother    Cancer Father    Hypertension Father    Diabetes Brother    Hyperlipidemia Brother    Hypertension Brother    Breast cancer Neg Hx    Prostate cancer Neg Hx    Colon cancer Neg Hx    Pancreatic cancer Neg Hx    Sleep apnea  Neg Hx    Social History   Socioeconomic History   Marital status: Married    Spouse name: [name]   Number of children: 5   Years of education: 13   Highest education level: Some college, no degree  Occupational History    Comment: retired  Tobacco Use   Smoking status: Never   Smokeless tobacco: Never  Vaping Use   Vaping status: Never Used  Substance  and Sexual Activity   Alcohol use: No   Drug use: No   Sexual activity: Not Currently  Other Topics Concern   Not on file  Social History Narrative   Lives with his wife. He enjoys walking everyday.   Social Drivers of Corporate investment banker Strain: Low Risk  (10/11/2023)   Overall Financial Resource Strain (CARDIA)    Difficulty of Paying Living Expenses: Not hard at all  Food Insecurity: No Food Insecurity (10/11/2023)   Hunger Vital Sign    Worried About Running Out of Food in the Last Year: Never true    Ran Out of Food in the Last Year: Never true  Transportation Needs: No Transportation Needs (10/11/2023)   PRAPARE - Administrator, Civil Service (Medical): No    Lack of Transportation (Non-Medical): No  Physical Activity: Insufficiently Active (10/11/2023)   Exercise Vital Sign    Days of Exercise per Week: 2 days    Minutes of Exercise per Session: 50 min  Stress: No Stress Concern Present (10/11/2023)   [employer] of Occupational Health - Occupational Stress Questionnaire    Feeling of Stress : Only a little  Social Connections: Socially Integrated (10/11/2023)   Social Connection and Isolation Panel [NHANES]    Frequency of Communication with Friends and Family: More than three times a week    Frequency of Social Gatherings with Friends and Family: Twice a week    Attends Religious Services: More than 4 times per year    Active Member of [employer] or Organizations: Yes    Attends Engineer, structural: More than 4 times per year    Marital Status: Married    Tobacco  Counseling Counseling given: Not Answered   Clinical Intake:  Pre-visit preparation completed: Yes  Pain : No/denies pain     BMI - recorded: 36.88 Nutritional Status: BMI > 30  Obese Nutritional Risks: None Diabetes: Yes CBG done?: No Did pt. bring in CBG monitor from home?: No  How often do you need to have someone help you when you read instructions, pamphlets, or other written materials from your doctor or pharmacy?: 1 - Never What is the last grade level you completed in school?: 14  Interpreter Needed?: No      Activities of Daily Living    10/11/2023    8:11 AM 10/10/2023    8:00 PM  In your present state of health, do you have any difficulty performing the following activities:  Hearing? 0 0  Vision? 0 0  Difficulty concentrating or making decisions? 0 0  Walking or climbing stairs? 1 1  Dressing or bathing? 0 0  Doing errands, shopping? 0 0  Preparing Food and eating ? N N  Using the Toilet? N N  In the past six months, have you accidently leaked urine? N N  Do you have problems with loss of bowel control? N N  Managing your Medications? N N  Managing your Finances? N N  Housekeeping or managing your Housekeeping? N N    Patient Care Team: Everrett Coombe, DO as PCP - General (Family Medicine) Nahser, Deloris Ping, MD as PCP - Cardiology (Cardiology) Felicita Gage, RN as Oncology Nurse Navigator Daisy Lazar, MD as Consulting Physician (Ophthalmology)  Indicate any recent Medical Services you may have received from other than [facility] providers in the past year (date may be approximate).     Assessment:   This is a routine wellness examination for Alicia.  Hearing/Vision  screen Hearing Screening - Comments:: Not tested Vision Screening - Comments:: Not tested   Goals Addressed             This Visit's Progress    Activity and Exercise Increased       He would like to increase his activity.       Depression Screen    10/11/2023    8:18 AM  04/19/2023   10:01 AM 10/08/2022    8:04 AM 12/03/2021   10:55 AM 04/13/2021   10:57 AM 01/30/2020   11:00 AM 06/08/2019    8:28 AM  PHQ 2/9 Scores  PHQ - 2 Score 0 0 0 1 0 1 0  PHQ- 9 Score    5  2     Fall Risk    10/11/2023    8:20 AM 10/10/2023    8:00 PM 04/19/2023   10:01 AM 10/08/2022    8:03 AM 12/03/2021   10:35 AM  Fall Risk   Falls in the past year? 0 0 0 0 0  Number falls in past yr: 0 0 0 0 0  Injury with Fall? 0 0 0 0 0  Risk for fall due to : No Fall Risks  No Fall Risks No Fall Risks No Fall Risks  Follow up Falls evaluation completed  Falls evaluation completed Falls evaluation completed Falls evaluation completed    MEDICARE RISK AT HOME: Medicare Risk at Home Any stairs in or around the home?: No If so, are there any without handrails?: No Home free of loose throw rugs in walkways, pet beds, electrical cords, etc?: Yes Adequate lighting in your home to reduce risk of falls?: Yes Life alert?: No Use of a cane, walker or w/c?: (Patient-Rptd) No Grab bars in the bathroom?: No Shower chair or bench in shower?: Yes Elevated toilet seat or a handicapped toilet?: No  TIMED UP AND GO:  Was the test performed?  Yes  Length of time to ambulate 10 feet: 11 sec Gait steady and fast without use of assistive device    Cognitive Function:        10/11/2023    8:20 AM 10/08/2022    8:09 AM 04/13/2021   11:01 AM  6CIT Screen  What Year? 0 points 0 points 0 points  What month? 0 points 0 points 0 points  What time? 0 points 0 points 0 points  Count back from 20 0 points 0 points 0 points  Months in reverse 0 points 0 points 0 points  Repeat phrase 2 points 0 points 2 points  Total Score 2 points 0 points 2 points    Immunizations Immunization History  Administered Date(s) Administered   Fluad Quad(high Dose 65+) 05/01/2020   H1N1 08/22/2008   Influenza Split 04/16/2014, 04/17/2015, 04/16/2016   Influenza, High Dose Seasonal PF 04/21/2022, 04/20/2023    Influenza,inj,Quad PF,6+ Mos 05/17/2017, 05/01/2018, 04/13/2019   Influenza-Unspecified 06/29/2002, 06/12/2004, 06/10/2005, 06/24/2006, 06/28/2007, 05/20/2008, 06/08/2010, 05/11/2011, 05/15/2012, 05/24/2013, 05/29/2015, 04/01/2016, 04/24/2018, 05/24/2019, 06/04/2020, 03/16/2021, 04/21/2022   Moderna Covid-19 Fall Seasonal Vaccine 46yrs & older 05/12/2022, 10/19/2022, 04/22/2023, 04/25/2023   Moderna Covid-19 Vaccine Bivalent Booster 65yrs & up 04/27/2021   Moderna Sars-Covid-2 Vaccination 09/04/2019, 10/03/2019, 06/14/2020, 09/03/2020, 11/19/2020   PNEUMOCOCCAL CONJUGATE-20 04/20/2023   Rsv, Bivalent, Protein Subunit Rsvpref,pf Verdis Frederickson) 06/28/2022, 04/17/2023   Td (Adult) 09/14/2018   Tdap 08/22/2008   Tetanus 08/22/2008   Zoster Recombinant(Shingrix) 04/13/2019, 06/15/2019    TDAP status: Up to date  Flu Vaccine status: Up to date  Pneumococcal vaccine status: Up to date  Covid-19 vaccine status: Information provided on how to obtain vaccines.   Qualifies for Shingles Vaccine? Yes   Zostavax completed No   Shingrix Completed?: Yes  Screening Tests Health Maintenance  Topic Date Due   Hepatitis C Screening  Never done   COVID-19 Vaccine (10 - 2024-25 season) 06/20/2023   HEMOGLOBIN A1C  10/17/2023   OPHTHALMOLOGY EXAM  03/23/2024   Diabetic kidney evaluation - eGFR measurement  04/18/2024   Diabetic kidney evaluation - Urine ACR  04/18/2024   FOOT EXAM  04/18/2024   Medicare Annual Wellness (AWV)  10/10/2024   DTaP/Tdap/Td (3 - Td or Tdap) 09/14/2028   Colonoscopy  09/21/2028   Pneumonia Vaccine 79+ Years old  Completed   INFLUENZA VACCINE  Completed   Zoster Vaccines- Shingrix  Completed   HPV VACCINES  Aged Out    Health Maintenance  Health Maintenance Due  Topic Date Due   Hepatitis C Screening  Never done   COVID-19 Vaccine (10 - 2024-25 season) 06/20/2023    Colorectal cancer screening: Type of screening: Colonoscopy. Completed 09/21/2018. Repeat every 10  years  Lung Cancer Screening: (Low Dose CT Chest recommended if Age 52-80 years, 20 pack-year currently smoking OR have quit w/in 15years.) does not qualify.   Lung Cancer Screening Referral: n/a  Additional Screening:  Hepatitis C Screening: does qualify; Completed Not completed  Vision Screening: Recommended annual ophthalmology exams for early detection of glaucoma and other disorders of the eye. Is the patient up to date with their annual eye exam?  Yes  Who is the provider or what is the name of the office in which the patient attends annual eye exams? Dr Durene Cal at [facility] If pt is not established with a provider, would they like to be referred to a provider to establish care?  N/a .   Dental Screening: Recommended annual dental exams for proper oral hygiene  Diabetic Foot Exam: Diabetic Foot Exam: Completed 04/19/2023  Community Resource Referral / Chronic Care Management: CRR required this visit?  No   CCM required this visit?  No     Plan:     I have personally reviewed and noted the following in the patient's chart:   Medical and social history Use of alcohol, tobacco or illicit drugs  Current medications and supplements including opioid prescriptions. Patient is not currently taking opioid prescriptions. Functional ability and status Nutritional status Physical activity Advanced directives List of other physicians Hospitalizations, surgeries, and ER visits in previous 12 months Vitals Screenings to include cognitive, depression, and falls Referrals and appointments  In addition, I have reviewed and discussed with patient certain preventive protocols, quality metrics, and best practice recommendations. A written personalized care plan for preventive services as well as general preventive health recommendations were provided to patient.     Esmond Harps, [place]   10/11/2023   After Visit Summary: (In Person-Printed) AVS printed and given to the patient  Nurse  Notes:   SAJAN CHEATWOOD is a 68 y.o. male patient of Everrett Coombe, DO who had a Medicare Annual Wellness Visit today via telephone. Raiden is Retired and lives with their spouse. He has 5 children. He reports that he is socially active and does interact with friends/family regularly. He is moderately physically active and enjoys walking and being in the water.   He may have done a Hep C screening with the [place].

## 2023-10-11 NOTE — Patient Instructions (Signed)
  Tanner Young , Thank you for taking time to come for your Medicare Wellness Visit. I appreciate your ongoing commitment to your health goals. Please review the following plan we discussed and let me know if I can assist you in the future.   These are the goals we discussed:  Goals       Activity and Exercise Increased      He would like to increase his activity.       Patient Stated (pt-stated)      04/13/2021 AWV Goal: Exercise for General Health  Patient will verbalize understanding of the benefits of increased physical activity: Exercising regularly is important. It will improve your overall fitness, flexibility, and endurance. Regular exercise also will improve your overall health. It can help you control your weight, reduce stress, and improve your bone density. Over the next year, patient will increase physical activity as tolerated with a goal of at least 150 minutes of moderate physical activity per week.  You can tell that you are exercising at a moderate intensity if your heart starts beating faster and you start breathing faster but can still hold a conversation. Moderate-intensity exercise ideas include: Walking 1 mile (1.6 km) in about 15 minutes Biking Hiking Golfing Dancing Water aerobics Patient will verbalize understanding of everyday activities that increase physical activity by providing examples like the following: Yard work, such as: Insurance underwriter Gardening Washing windows or floors Patient will be able to explain general safety guidelines for exercising:  Before you start a new exercise program, talk with your health care provider. Do not exercise so much that you hurt yourself, feel dizzy, or get very short of breath. Wear comfortable clothes and wear shoes with good support. Drink plenty of water while you exercise to prevent dehydration or heat stroke. Work out until your  breathing and your heartbeat get faster.       Patient Stated (pt-stated)      Patient stated that he would like to maintain his A1C at below 6.        This is a list of the screening recommended for you and due dates:  Health Maintenance  Topic Date Due   Hepatitis C Screening  Never done   COVID-19 Vaccine (10 - 2024-25 season) 06/20/2023   Hemoglobin A1C  10/17/2023   Eye exam for diabetics  03/23/2024   Yearly kidney function blood test for diabetes  04/18/2024   Yearly kidney health urinalysis for diabetes  04/18/2024   Complete foot exam   04/18/2024   Medicare Annual Wellness Visit  10/10/2024   DTaP/Tdap/Td vaccine (3 - Td or Tdap) 09/14/2028   Colon Cancer Screening  09/21/2028   Pneumonia Vaccine  Completed   Flu Shot  Completed   Zoster (Shingles) Vaccine  Completed   HPV Vaccine  Aged Out

## 2023-10-17 ENCOUNTER — Ambulatory Visit: Payer: Medicare Other | Admitting: Family Medicine

## 2023-11-01 ENCOUNTER — Inpatient Hospital Stay
Admission: RE | Admit: 2023-11-01 | Discharge: 2023-11-01 | Disposition: A | Discharge status: Home / Self Care | Payer: Self-pay | Source: Ambulatory Visit | Attending: Radiation Oncology | Admitting: Radiation Oncology

## 2023-11-01 ENCOUNTER — Telehealth: Payer: Self-pay | Admitting: Radiation Oncology

## 2023-11-01 ENCOUNTER — Other Ambulatory Visit: Payer: Self-pay | Admitting: Radiation Oncology

## 2023-11-01 ENCOUNTER — Ambulatory Visit: Payer: Medicare Other | Admitting: Clinical

## 2023-11-01 DIAGNOSIS — C61 Malignant neoplasm of prostate: Secondary | ICD-10-CM

## 2023-11-01 DIAGNOSIS — F4322 Adjustment disorder with anxiety: Secondary | ICD-10-CM

## 2023-11-01 NOTE — Telephone Encounter (Signed)
 Left message for patient to call back to schedule consult per 3/12 referral.

## 2023-11-01 NOTE — Progress Notes (Signed)
 Time: 8:59 am-9:29 am CPT Code: [id number] Diagnosis Code: F43.22  Lindwood was seen remotely using secure video conferencing. He was a passenger in a car in [place] and therapist was in her office at time of appointment. Client is aware of risks of telehealth and consented to a virtual visit. He shared that he had undergone eye surgery the week prior, and it had gone well. He had also learned that he would need to undergo radiation treatment for his prostate cancer. Session focused on his processing of this and the impact on his and his wife's plans. He reported doing well overall, and requested to be seen again in 6 weeks.   Treatment Plan Client Abilities/Strengths  Kiko described himself as willing to complete therapy appointment. He described himself as guarded in nature but willing to try to open up in his sessions.  Client Treatment Preferences:  None. He described himself as open to virtual or in-person therapy.  Client Statement of Needs  Bing is seeking clarity in terms of his own emotional experience of his cancer diagnosis. He has noticed a bigger emotional response to his support group than to others groups he has participated in, and would like to unpack his emotional experience of this. Treatment Level  Monthly   Symptoms  Difficulty communicating about emotionally challenging topics, tendency to shut down and internalize Problems Addressed  Goals 1. List Goal. Add as many as needed Objective Tyqwan would like to increase his openness to talking about emotionally challenging topics  Target Date: 02/26/2024 Frequency: Monthly  Progress: 70% Modality: Individual  Related Interventions 1. Caryn Bee will be provided with opportunities to process his experiences in session 2.  Therapist will help Mckade to notice and disengage from maladaptive thoughts nd behaviors using CBT-based strategies 3. Therapist will provide emotion regulation strategies  including meditation, mindfulness,  and general self-care 4. Therapist will provide referrals for additional resources as appropriate Objective Jethro would like to reduce his anxiety related to support groups he attends regularly, as well as in relation to day-to-day events  Target Date: 02/26/2024 Frequency: Monthly  Progress: 80% Modality: individual  Related Interventions Diagnosis Axis none Adjustment Disorder with Anxiety, F43.22   Axis none    Conditions For Discharge Achievement of treatment goals and objectives  Intake  Presenting Problem  Taelyn shared that he was diagnosed with prostate cancer in February of 2022. He underwent surgery in June of 2022. This was reported to have gone well. He has participated in a support group for cancer survivors at [facility] once monthly. He reported that he has completed chemo and attends follow-up appointments every three months. He reported that he has felt overwhelmed hearing the accounts of fellow group members. He also experiences a long-standing back injury. He is not officially in remission, and is monitoring his status with medical providers. Symptoms  Social withdrawal, feelings of overwhelm, "what-if" thoughts, difficulty relaxing when scans are approaching, feelings of restlessness History of Problem   Thom would like to process emotions related to his cancer diagnosis in February of 2022. He shared that he has experienced episodes of anxiety prior to this occasionally. Recent Trigger   Eliah was referred by his PCP, Dr. Everrett Coombe, after he shared feelings of overwhelm in relation to his cancer support group.   Marital and Family Information  Alvy lives with his wife of 36 years. He has five adult children, the youngest of whom is 67 and the oldest of whom is 42.    Present family  concerns/problems: Shae shared that his adult daughter has had some psychiatric difficulties. All of his children live independently and maintain employment. He also has a  one-year-old grandchild born to his 46 year-old daughter.   Strengths/resources in the family/friends:  Jaice described a supportive relationship with his wife, and he sometimes feels concerned for her stress levels. He has several close friendships and described them as a tight knit group.   Marital/sexual history patterns:    Family of Origin Kasin's mother is alive but his father has passed. He is the oldest of three, and has a brother and sister. He has several nephews and two nieces. [place] reported [place] and joined the [employer] at 44.   Problems in family of origin: Anthonny's father passed of brain cancer at 104 years of age. He was healthy until then. Family background / ethnic factors: Saulo has lived in [place] his whole life, but traveled during his time in the service. He was raised in the [facility].   No needs/concerns related to ethnicity reported when asked: No  Education/Vocation   Interpersonal concerns/problems: None.  Personal strengths:  Ability to get along with others Military/work problems/concerns: Perry is retired. 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 Prior to that time, he had been working for the post [employer]. Leisure Activities/Daily Functioning   Legal Status  No Legal Problems: No Medical/Nutritional Concerns   Prostate cancer diagnosed Feb of 2022, back problems, kidney problems. Eldar was diagnosed with kidney problems in 1999. He broke his kneecap several years prior to the initial intake. He has also undergone extensive dental work.   Comments:    Substance use/abuse/dependence: Occasional glass of wine, otherwise no substances   Comments:    Religion/Spirituality: Neftaly described himself as Ephriam Knuckles in the sense of wanting to treat others well and follow the golden rule. However, he reported that he does not strictly adhere to anyone religious dogma.   General Behavior: WNL Attire: WNL Gait:  not observed-telehealth Motor Activity: WNL Stream of Thought - Productivity: WNL Stream of thought - Progression: WNL Stream of thought - Language:  WNL Emotional tone and reactions - Mood: WNL  Emotional tone and reactions - Affect: WNL Mental trend/Content of thoughts - Perception: WNL  Mental trend/Content of thoughts - Orientation: WNL Mental trend/Content of thoughts - Memory: WNL Mental trend/Content of thoughts - General knowledge: WNL  Insight: Good Judgment: Good Intelligence: Average Mental Status Comment: Good Diagnostic Summary: F43.22, adjustment disorder with anxiety         Chrissie Noa, PhD               Chrissie Noa, PhD

## 2023-11-02 ENCOUNTER — Ambulatory Visit: Payer: Medicare Other | Admitting: Family Medicine

## 2023-11-03 ENCOUNTER — Encounter: Payer: Self-pay | Admitting: Radiation Oncology

## 2023-11-03 NOTE — Progress Notes (Signed)
 GU Location of Tumor / Histology: Prostate Ca  PSA 0.17 on 09/26/2023 PSA 0.14 on 06/28/2023 PSA  0.09 on 03/25/2023  Radical Prostatectomy (02/03/2021, by Dr. Harold Barban).  Delynn Flavin presented as referral form Dr. Carolanne Grumbling [facility]) elevated PSA.  09/18/2020 Biopsy      Past/Anticipated interventions by urology, if any:   Jessica C. Chestine Spore, PA   Past/Anticipated interventions by medical oncology, if any: NA  Weight changes, if any: No  IPSS:  5 SHIM:  7  Bowel/Bladder complaints, if any:  No  Nausea/Vomiting, if any:  No  Pain issues, if any:  0/10  SAFETY ISSUES: Prior radiation? No Pacemaker/ICD? No Possible current pregnancy? Male Is the patient on methotrexate? No  Current Complaints / other details:

## 2023-11-09 ENCOUNTER — Encounter: Payer: Self-pay | Admitting: Physician Assistant

## 2023-11-14 NOTE — Progress Notes (Signed)
 Radiation Oncology         [phone] ________________________________  Initial Outpatient Consultation  Name: Tanner Young MRN: [id number]  Date: 11/15/2023  DOB: 04-Jan-1956  WU:[id number], Ivin Booty, MD  Carolanne Grumbling, MD   REFERRING PHYSICIAN: Carolanne Grumbling, MD  DIAGNOSIS: 68 y.o. gentleman with biochemical recurrence of prostate cancer as indicated by a rising, detectable PSA of 0.17 s/p RALP in 01/2021 for Stage pT3aN0, Gleason 4+3 prostate cancer with pre-treatment PSA of 5.78.    ICD-10-CM   1. Biochemically recurrent malignant neoplasm of prostate [facility])  C61    R97.21       HISTORY OF PRESENT ILLNESS: Tanner Young is a 68 y.o. male with a diagnosis of biochemically recurrent prostate cancer. He was initially referred to Dr. Cardell Peach in 08/2020 for an elevated PSA of 5.78. He was subsequently diagnosed with Gleason 4+4 prostate cancer involving 1 of 5 positive cores found on TRUSPBx performed on 09/18/20. We met the patient on 10/28/20 via MyChart visit to discuss potential treatment options and he also met with Dr. Carin Primrose and Dr. Nedra Hai at [facility]. Staging CT A/P and bone scan performed at [facility] on 10/31/20 were negative for metastatic disease. He ultimately elected to proceed with RALP on 02/03/21 under the care of of Dr. Harold Barban at [facility].  Final surgical pathology revealed Gleason 4+3 prostatic adenocarcinoma with extraprostatic extension at the left posterior mid gland with a positive margin at the right base and a suspected positive margin at the right bladder neck. Seminal vesicles and all 8 sampled lymph nodes were negative and  his initial postoperative PSA was undetectable.  His PSA remained undetectable through 2023 but became very low detectable at 0.03 on labs in February 2024. It has continued to gradually increase, most recently reaching 0.17 on 09/26/23. A restaging PSMA PET scan was performed on 09/26/23 showing no evidence of PSMA-avid local recurrence or nodal,  visceral, or osseous metastatic disease.  The patient reviewed the pathology, imaging and PSA results with his urologist and he has kindly been referred today for discussion of potential salvage radiation treatment options. He also met with Dr. Hilda Blades at [facility] and genomic testing on the surgical specimen was sent in hopes that it will inform recommendations for or against concurrent ADT in his case.  The results remain pending.  PREVIOUS RADIATION THERAPY: No  PAST MEDICAL HISTORY:  Past Medical History:  Diagnosis Date   Arthritis    Chronic back pain    Chronic kidney disease    Cough    Diabetes mellitus type II, controlled (HCC)    Dry skin    ED (erectile dysfunction)    Elevated PSA    Floaters in visual field    GERD (gastroesophageal reflux disease)    Glaucoma, both eyes    Hay fever    Heartburn    Hyperlipemia    Hypertension    Itching    Joint pain    Kidney disease    Lactose intolerance    Leg cramp    Low back pain    Multiple food allergies    Muscle pain    Neuropathy    rt leg from DDD   Prostate cancer (HCC)    Rash    Sleep apnea    uses a cpap   Stiff neck    Swelling of lower extremity    Trouble in sleeping    Vision changes    Vitamin D deficiency  Wears glasses    Wears partial dentures    top mid partial-flipper      PAST SURGICAL HISTORY: Past Surgical History:  Procedure Laterality Date   COLONOSCOPY     DENTAL SURGERY     extractions   EYE SURGERY     ORIF PATELLA Right 01/24/2013   Procedure: OPEN REDUCTION INTERNAL (ORIF) FIXATION PATELLA;  Surgeon: Nestor Lewandowsky, MD;  Location: [facility];  Service: Orthopedics;  Laterality: Right;  orif right patella,patellectomy    PROSTATE BIOPSY     PROSTATECTOMY  02/03/2021   [facility]    FAMILY HISTORY:  Family History  Problem Relation Age of Onset   Arthritis Mother    Hearing loss Mother    Hyperlipidemia Mother    Hypertension Mother    Diabetes Mother     Cancer Father    Hypertension Father    Diabetes Brother    Hyperlipidemia Brother    Hypertension Brother    Breast cancer Neg Hx    Prostate cancer Neg Hx    Colon cancer Neg Hx    Pancreatic cancer Neg Hx    Sleep apnea Neg Hx     SOCIAL HISTORY:  Social History   Socioeconomic History   Marital status: Married    Spouse name: [name]   Number of children: 5   Years of education: 13   Highest education level: Some college, no degree  Occupational History    Comment: retired  Tobacco Use   Smoking status: Never   Smokeless tobacco: Never  Vaping Use   Vaping status: Never Used  Substance and Sexual Activity   Alcohol use: No   Drug use: No   Sexual activity: Not Currently  Other Topics Concern   Not on file  Social History Narrative   Lives with his wife. He enjoys walking everyday.   Social Drivers of Corporate investment banker Strain: Low Risk  (10/11/2023)   Overall Financial Resource Strain (CARDIA)    Difficulty of Paying Living Expenses: Not hard at all  Food Insecurity: No Food Insecurity (10/11/2023)   Hunger Vital Sign    Worried About Running Out of Food in the Last Year: Never true    Ran Out of Food in the Last Year: Never true  Transportation Needs: No Transportation Needs (10/11/2023)   PRAPARE - Administrator, Civil Service (Medical): No    Lack of Transportation (Non-Medical): No  Physical Activity: Insufficiently Active (10/11/2023)   Exercise Vital Sign    Days of Exercise per Week: 2 days    Minutes of Exercise per Session: 50 min  Stress: No Stress Concern Present (10/11/2023)   [employer] of Occupational Health - Occupational Stress Questionnaire    Feeling of Stress : Only a little  Social Connections: Socially Integrated (10/11/2023)   Social Connection and Isolation Panel [NHANES]    Frequency of Communication with Friends and Family: More than three times a week    Frequency of Social Gatherings with Friends and  Family: Twice a week    Attends Religious Services: More than 4 times per year    Active Member of [employer] or Organizations: Yes    Attends Engineer, structural: More than 4 times per year    Marital Status: Married  Catering manager Violence: Not At Risk (10/11/2023)   Humiliation, Afraid, Rape, and Kick questionnaire    Fear of Current or Ex-Partner: No    Emotionally Abused: No  Physically Abused: No    Sexually Abused: No    ALLERGIES: Shellfish allergy and Lisinopril  MEDICATIONS:  Current Outpatient Medications  Medication Sig Dispense Refill   amLODipine (NORVASC) 10 MG tablet Take 0.5 tablets by mouth daily.     atropine 1 % ophthalmic solution Apply to eye.     ciprofloxacin-dexamethasone (CIPRODEX) OTIC suspension Place 4 drops into both ears 2 (two) times daily as needed.     erythromycin ophthalmic ointment Apply to eye.     methazolamide (NEPTAZANE) 25 MG tablet Take 50 mg by mouth 2 (two) times daily.     ofloxacin (OCUFLOX) 0.3 % ophthalmic solution Apply to eye.     prednisoLONE acetate (PRED FORTE) 1 % ophthalmic suspension Apply to eye.     albuterol (VENTOLIN HFA) 108 (90 Base) MCG/ACT inhaler Inhale into the lungs.     benazepril (LOTENSIN) 40 MG tablet Take 40 mg by mouth daily.     blood glucose meter kit and supplies KIT Dispense based on patient and insurance preference. Use daily as directed to monitor blood sugar. (FOR ICD-9 250.00, 250.01). 1 each 0   brimonidine (ALPHAGAN) 0.15 % ophthalmic solution Place 1 drop into both eyes 2 (two) times daily at 10 AM and 5 PM.     cetirizine (ZYRTEC) 10 MG tablet Take 10 mg by mouth daily. As needed.     cholecalciferol (VITAMIN D) 1000 UNITS tablet Take 1,000 Units by mouth daily.     desonide (DESOWEN) 0.05 % cream Apply topically 2 (two) times daily.     diazepam (VALIUM) 10 MG tablet TAKE 1 TABLET BY MOUTH ONCE FOR 1 DOSE. TAKE 1 HOURS PRIOR TO DENTAL PROCEDURE 5 tablet 0   dorzolamide-timolol (COSOPT)  22.3-6.8 MG/ML ophthalmic solution Place 1 drop into both eyes 2 (two) times daily.      Easy Touch Lancets 26G MISC Use as directed daily 100 each 1   empagliflozin (JARDIANCE) 25 MG TABS tablet Take 0.5 tablets by mouth daily.     famotidine (PEPCID) 20 MG tablet TAKE ONE TABLET BY MOUTH DAILY FOR GERD     fluocinonide (LIDEX) 0.05 % external solution APPLY SMALL AMOUNT TO AFFECTED AREA EVERY 7 DAYS AS NEEDED FOR SCALP ITCH     fluticasone (FLONASE) 50 MCG/ACT nasal spray INSTILL 1 SPRAY IN EACH NOSTRIL TWICE A DAY     fluticasone (VERAMYST) 27.5 MCG/SPRAY nasal spray two sprays by Both Nostrils route daily as needed for Allergies.     gabapentin (NEURONTIN) 300 MG capsule Take 600 mg by mouth. Takes 2 in the morning and 2 at bedtime     glucose blood (PRECISION XTRA TEST STRIPS) test strip Use as directed daily. 100 each 1   ketoconazole (NIZORAL) 2 % cream Apply 1 application topically daily.     ketotifen (ZADITOR) 0.035 % ophthalmic solution Apply to eye.     Latanoprostene Bunod 0.024 % SOLN Only uses for left eye once a day     meloxicam (MOBIC) 15 MG tablet Take by mouth.     NEFFY 2 MG/0.1ML SOLN Place into both nostrils.     Netarsudil Dimesylate 0.02 % SOLN Apply to eye.     nystatin-triamcinolone ointment (MYCOLOG) APPLY MODERATE AMOUNT TO AFFECTED AREA TWICE A DAY     omeprazole (PRILOSEC) 20 MG capsule Take 20 mg by mouth at bedtime.     PRESCRIPTION MEDICATION Apply 1 application topically daily. Cream to feet      PRESCRIPTION MEDICATION Apply 1 application  topically daily. Cream to back      Semaglutide, 1 MG/DOSE, 4 MG/3ML SOPN Inject 1 mg as directed once a week. 9 mL 1   sildenafil (VIAGRA) 100 MG tablet Take 100 mg by mouth daily as needed for erectile dysfunction.     simvastatin (ZOCOR) 20 MG tablet Take 10 mg by mouth at bedtime.     terbinafine (LAMISIL) 1 % cream Apply 1 application topically 2 (two) times daily.     triamcinolone ointment (KENALOG) 0.1 % Apply 1  application topically 2 (two) times daily.     triamterene-hydrochlorothiazide (MAXZIDE) 75-50 MG per tablet Take 0.5 tablets by mouth daily.     Turmeric Curcumin 12-998 MG CAPS Take 2 capsules by mouth every morning.     No current facility-administered medications for this encounter.    REVIEW OF SYSTEMS:  On review of systems, the patient reports that he is doing well overall. He denies any chest pain, shortness of breath, cough, fevers, chills, night sweats, unintended weight changes. He denies any bowel disturbances, and denies abdominal pain, nausea or vomiting. He denies any new musculoskeletal or joint aches or pains. His IPSS was 5, indicating mild urinary symptoms. His SHIM was 7, indicating he likely has severe postoperative erectile dysfunction. A complete review of systems is obtained and is otherwise negative.    PHYSICAL EXAM:  Wt Readings from Last 3 Encounters:  11/15/23 261 lb (118.4 kg)  10/11/23 257 lb (116.6 kg)  05/18/23 263 lb 6.4 oz (119.5 kg)   Temp Readings from Last 3 Encounters:  11/15/23 (!) 96.9 F (36.1 C) (Temporal)  04/15/21 98.8 F (37.1 C) (Oral)  06/08/19 98.2 F (36.8 C) (Oral)   BP Readings from Last 3 Encounters:  11/15/23 (!) 147/96  10/11/23 108/62  05/18/23 127/73   Pulse Readings from Last 3 Encounters:  11/15/23 71  10/11/23 61  05/18/23 (!) 58    /10  In general this is a well appearing African-American man in no acute distress. He's alert and oriented x4 and appropriate throughout the examination. Cardiopulmonary assessment is negative for acute distress, and he exhibits normal effort.     KPS = 100  100 - Normal; no complaints; no evidence of disease. 90   - Able to carry on normal activity; minor signs or symptoms of disease. 80   - Normal activity with effort; some signs or symptoms of disease. 51   - Cares for self; unable to carry on normal activity or to do active work. 60   - Requires occasional assistance, but is able  to care for most of his personal needs. 50   - Requires considerable assistance and frequent medical care. 40   - Disabled; requires special care and assistance. 30   - Severely disabled; hospital admission is indicated although death not imminent. 20   - Very sick; hospital admission necessary; active supportive treatment necessary. 10   - Moribund; fatal processes progressing rapidly. 0     - Dead  Karnofsky DA, Abelmann WH, Craver LS and Burchenal [facility] [phone]) The use of the nitrogen mustards in the palliative treatment of carcinoma: with particular reference to bronchogenic carcinoma Cancer 1 634-56  LABORATORY DATA:  Lab Results  Component Value Date   WBC 4.3 04/19/2023   HGB 14.6 04/19/2023   HCT 44.3 04/19/2023   MCV 93 04/19/2023   PLT 187 04/19/2023   Lab Results  Component Value Date   NA 140 04/19/2023   K 4.2 04/19/2023  CL 103 04/19/2023   CO2 22 04/19/2023   Lab Results  Component Value Date   ALT 27 04/19/2023   AST 24 04/19/2023   ALKPHOS 61 04/19/2023   BILITOT 0.9 04/19/2023     RADIOGRAPHY: No results found.    IMPRESSION/PLAN: 1. 68 y.o. gentleman with biochemical recurrence of prostate cancer as indicated by a rising, detectable PSA of 0.17 s/p RALP in 01/2021 for Stage pT3aN0, Gleason 4+3 prostate cancer with pre-treatment PSA of 5.78.  Today, we reviewed the findings and workup thus far.  We discussed the natural history of prostate cancer.  We reviewed the the implications of positive margins, extracapsular extension, and seminal vesicle involvement on the risk of prostate cancer recurrence. In his case, extraprostatic extension and positive margins were present along with a rising, detectable postoperative PSA. We reviewed some of the evidence suggesting an advantage for patients who undergo salvage radiotherapy in the setting in terms of disease control and overall survival. We discussed radiation treatment directed to the prostatic fossa with regard to  the logistics and delivery of external beam radiation treatment.  He and his wife were encouraged to ask questions were answered to their stated satisfaction.  At the conclusion of our conversation, the patient is interested in moving forward with the recommended 7.5 week course of daily salvage external beam therapy but is unsure whether he will have the treatments here in [place] or at [facility].  He and his wife are in the process of relocating to the [place] to be closer to their son and daughter. He also prefers to wait for the genomic testing results to inform any recommendation for ADT, prior to starting treatments. The patient appears to have a good understanding of his disease and our treatment recommendations which are of curative intent.  We will share our discussion with Dr. Harold Barban and his team at [facility], so that once he reaches a final decision, we can move forward with treatment planning accordingly, in anticipation of beginning his salvage IMRT in the near future.  We personally spent 70 minutes in this encounter including chart review, reviewing radiological studies, meeting face-to-face with the patient, entering orders, coordinating care and completing documentation.     Marguarite Arbour, PA-C    Margaretmary Dys, MD  [facility]  Radiation Oncology Direct Dial: [phone]  Fax: [phone] [email or web address]  Skype  [email or web address]   This document serves as a record of services personally performed by Margaretmary Dys, MD and Marcello Fennel, PA-C. It was created on their behalf by Mickie Bail, a trained medical scribe. The creation of this record is based on the scribe's personal observations and the provider's statements to them. This document has been checked and approved by the attending provider.

## 2023-11-15 ENCOUNTER — Ambulatory Visit
Admission: RE | Admit: 2023-11-15 | Discharge: 2023-11-15 | Disposition: A | Discharge status: Home / Self Care | Source: Ambulatory Visit | Attending: Radiation Oncology | Admitting: Radiation Oncology

## 2023-11-15 ENCOUNTER — Encounter: Payer: Self-pay | Admitting: Radiation Oncology

## 2023-11-15 VITALS — BP 147/96 | HR 71 | Temp 96.9°F | Resp 18 | Ht 70.0 in | Wt 261.0 lb

## 2023-11-15 DIAGNOSIS — E1122 Type 2 diabetes mellitus with diabetic chronic kidney disease: Secondary | ICD-10-CM | POA: Insufficient documentation

## 2023-11-15 DIAGNOSIS — G4730 Sleep apnea, unspecified: Secondary | ICD-10-CM | POA: Diagnosis not present

## 2023-11-15 DIAGNOSIS — E559 Vitamin D deficiency, unspecified: Secondary | ICD-10-CM | POA: Insufficient documentation

## 2023-11-15 DIAGNOSIS — M129 Arthropathy, unspecified: Secondary | ICD-10-CM | POA: Insufficient documentation

## 2023-11-15 DIAGNOSIS — E785 Hyperlipidemia, unspecified: Secondary | ICD-10-CM | POA: Diagnosis not present

## 2023-11-15 DIAGNOSIS — C61 Malignant neoplasm of prostate: Secondary | ICD-10-CM | POA: Diagnosis present

## 2023-11-15 DIAGNOSIS — Z7985 Long-term (current) use of injectable non-insulin antidiabetic drugs: Secondary | ICD-10-CM | POA: Insufficient documentation

## 2023-11-15 DIAGNOSIS — Z7984 Long term (current) use of oral hypoglycemic drugs: Secondary | ICD-10-CM | POA: Diagnosis not present

## 2023-11-15 DIAGNOSIS — Z79899 Other long term (current) drug therapy: Secondary | ICD-10-CM | POA: Diagnosis not present

## 2023-11-15 DIAGNOSIS — E1140 Type 2 diabetes mellitus with diabetic neuropathy, unspecified: Secondary | ICD-10-CM | POA: Diagnosis not present

## 2023-11-15 DIAGNOSIS — I10 Essential (primary) hypertension: Secondary | ICD-10-CM | POA: Insufficient documentation

## 2023-11-15 DIAGNOSIS — G8929 Other chronic pain: Secondary | ICD-10-CM | POA: Insufficient documentation

## 2023-11-15 DIAGNOSIS — K219 Gastro-esophageal reflux disease without esophagitis: Secondary | ICD-10-CM | POA: Diagnosis not present

## 2023-11-15 HISTORY — DX: Male erectile dysfunction, unspecified: N52.9

## 2023-11-15 HISTORY — DX: Elevated prostate specific antigen (PSA): R97.20

## 2023-11-15 NOTE — Progress Notes (Signed)
 Introduced myself to the patient, and his wife, as the prostate nurse navigator.  No barriers to care identified at this time.  He is here to discuss his radiation treatment options and is still deciding on treatment locations.  I gave him my business card and asked him to call me with questions or concerns.  Verbalized understanding.

## 2023-11-22 NOTE — Progress Notes (Signed)
 RN spoke with patient to follow up to review treatment decision location.  Patient is still taking some time to debate location, and is hopeful to finalize treatment decision this week.  Patient agreeable for follow up next week.

## 2023-12-02 NOTE — Progress Notes (Signed)
 Patient will proceed with treatment at [facility] for ADT and salvage radiation.  No additional needs at this time.

## 2023-12-12 ENCOUNTER — Ambulatory Visit (INDEPENDENT_AMBULATORY_CARE_PROVIDER_SITE_OTHER): Admitting: Clinical

## 2023-12-12 DIAGNOSIS — F4322 Adjustment disorder with anxiety: Secondary | ICD-10-CM

## 2023-12-12 NOTE — Progress Notes (Signed)
 Time: 12:00 pm-12:54 pm CPT Code: [id number] Diagnosis Code: F43.22  Tanner Young was seen remotely using secure video conferencing. He was in a parked car in his driveway and therapist was in her office at time of appointment. Client is aware of risks of telehealth and consented to a virtual visit. He reported that he and his wife had put their house on the market and put an offer on a new home, but were still deciding whether they actually want to move. Session focused on exploring the pros and cons of his options. He has started hormone treatment, with plans to initiate radiation therapy in late May. He is scheduled to be seen again in 5 weeks.  Treatment Plan Client Abilities/Strengths  Tanner Young described himself as willing to complete therapy appointment. He described himself as guarded in nature but willing to try to open up in his sessions.  Client Treatment Preferences:  None. He described himself as open to virtual or in-person therapy.  Client Statement of Needs  Tanner Young is seeking clarity in terms of his own emotional experience of his cancer diagnosis. He has noticed a bigger emotional response to his support group than to others groups he has participated in, and would like to unpack his emotional experience of this. Treatment Level  Monthly   Symptoms  Difficulty communicating about emotionally challenging topics, tendency to shut down and internalize Problems Addressed  Goals 1. List Goal. Add as many as needed Objective Tanner Young would like to increase his openness to talking about emotionally challenging topics  Target Date: 02/26/2024 Frequency: Monthly  Progress: 70% Modality: Individual  Related Interventions 1. Kevin will be provided with opportunities to process his experiences in session 2.  Therapist will help [name] to notice and disengage from maladaptive thoughts nd behaviors using CBT-based strategies 3. Therapist will provide emotion regulation strategies  including  meditation, mindfulness, and general self-care 4. Therapist will provide referrals for additional resources as appropriate Objective Tanner Young would like to reduce his anxiety related to support groups he attends regularly, as well as in relation to day-to-day events  Target Date: 02/26/2024 Frequency: Monthly  Progress: 80% Modality: individual  Related Interventions Diagnosis Axis none Adjustment Disorder with Anxiety, F43.22   Axis none    Conditions For Discharge Achievement of treatment goals and objectives  Intake  Presenting Problem  Tanner Young shared that he was diagnosed with prostate cancer in February of 2022. He underwent surgery in June of 2022. This was reported to have gone well. He has participated in a support group for cancer survivors at [facility] once monthly. He reported that he has completed chemo and attends follow-up appointments every three months. He reported that he has felt overwhelmed hearing the accounts of fellow group members. He also experiences a long-standing back injury. He is not officially in remission, and is monitoring his status with medical providers. Symptoms  Social withdrawal, feelings of overwhelm, "what-if" thoughts, difficulty relaxing when scans are approaching, feelings of restlessness History of Problem   Tanner Young would like to process emotions related to his cancer diagnosis in February of 2022. He shared that he has experienced episodes of anxiety prior to this occasionally. Recent Trigger   Tanner Young was referred by his PCP, Dr. [name], after he shared feelings of overwhelm in relation to his cancer support group.   Marital and Family Information  Tanner Young lives with his wife of 36 years. He has five adult children, the youngest of whom is 29 and the oldest of whom is 13.  Present family concerns/problems: Tanner Young shared that his adult daughter has had some psychiatric difficulties. All of his children live independently and maintain  employment. He also has a one-year-old grandchild born to his 45 year-old daughter.   Strengths/resources in the family/friends:  Cleven described a supportive relationship with his wife, and he sometimes feels concerned for her stress levels. He has several close friendships and described them as a tight knit group.   Marital/sexual history patterns:    Family of Origin Tanner Young's mother is alive but his father has passed. He is the oldest of three, and has a brother and sister. He has several nephews and two nieces. [place] joined the [employer] at 73.   Problems in family of origin: Tanner Young's father passed of brain cancer at 81 years of age. He was healthy until then. Family background / ethnic factors: Tanner Young has lived in [place]  his whole life, but traveled during his time in the service. He was raised in the [facility].   No needs/concerns related to ethnicity reported when asked: No  Education/Vocation   Interpersonal concerns/problems: None.  Personal strengths:  Ability to get along with others Military/work problems/concerns: [employer] is retired. 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 Prior to that time, he had been working for the [employer]. Leisure Activities/Daily Functioning   Legal Status  No Legal Problems: No Medical/Nutritional Concerns   Prostate cancer diagnosed Feb of 2022, back problems, kidney problems. Tanner Young was diagnosed with kidney problems in 1999. He broke his kneecap several years prior to the initial intake. He has also undergone extensive dental work.   Comments:    Substance use/abuse/dependence: Occasional glass of wine, otherwise no substances   Comments:    Religion/Spirituality: Tanner Young described himself as Tanner Young in the sense of wanting to treat others well and follow the golden rule. However, he reported that he does not strictly adhere to anyone religious dogma.   General Behavior:  WNL Attire: WNL Gait: not observed-telehealth Motor Activity: WNL Stream of Thought - Productivity: WNL Stream of thought - Progression: WNL Stream of thought - Language:  WNL Emotional tone and reactions - Mood: WNL  Emotional tone and reactions - Affect: WNL Mental trend/Content of thoughts - Perception: WNL  Mental trend/Content of thoughts - Orientation: WNL Mental trend/Content of thoughts - Memory: WNL Mental trend/Content of thoughts - General knowledge: WNL  Insight: Good Judgment: Good Intelligence: Average Mental Status Comment: Good Diagnostic Summary: F43.22, adjustment disorder with anxiety         Arlene Lacy, PhD               Arlene Lacy, PhD

## 2024-01-19 ENCOUNTER — Ambulatory Visit: Admitting: Clinical

## 2024-01-23 ENCOUNTER — Ambulatory Visit: Admitting: Clinical

## 2024-01-23 NOTE — Progress Notes (Unsigned)
   Chrissie Noa, PhD

## 2024-04-06 ENCOUNTER — Other Ambulatory Visit: Payer: Self-pay | Admitting: Family Medicine

## 2024-04-17 ENCOUNTER — Encounter: Payer: Self-pay | Admitting: Sports Medicine

## 2024-06-11 NOTE — Progress Notes (Signed)
   06/11/2024  Patient ID: Tanner Young, male   DOB: [birth year], 68 y.o.   MRN: [id number]  This patient is appearing on a report for being at risk of failing the adherence measure for diabetes medications this calendar year.   Medication: Ozempic  1 mg/dose 4 mg/32mL pen Last fill date: 04/10/24 for 28 day supply  MyChart message sent to patient.  Rowena Moilanen C. Afia Messenger [facility] PharmD Candidate Class of [phone]

## 2024-06-18 NOTE — Progress Notes (Signed)
   06/18/2024  Patient ID: Tanner Young, male   DOB: 1955-08-21, 68 y.o.   MRN: [id number]  Appears Ozempic  is now prescribed by Dr. Roxane outside of [facility], and patient is now followed by this provider for primary care.  [name], PharmD, DPLA

## 2024-06-19 NOTE — Progress Notes (Signed)
   06/19/2024  Patient ID: Tanner Young, male   DOB: 1956-04-13, 68 y.o.   MRN: [id number]  Ozempic  has been stopped due to pancreatitis, and patient is now followed by PCP with [facility].  [name], PharmD, DPLA

## 2024-09-14 ENCOUNTER — Telehealth: Payer: Self-pay

## 2024-09-14 NOTE — Progress Notes (Unsigned)
 Pharmacy Quality Measure Review  This patient is appearing on a report for being at risk of failing the adherence measure for diabetes medications this calendar year.   Medication: Ozempic  1mg  Last fill date: 04/10/2024 for 28 day supply  Ozempic  has been discontinued due to pancreatitis, and patient is now followed by PCP with [facility].   Lisle Slocumb Student - PharmD.

## 2024-10-04 ENCOUNTER — Ambulatory Visit: Payer: Medicare Other | Admitting: Family Medicine

## 2024-10-04 ENCOUNTER — Ambulatory Visit: Admitting: Neurology

## 2024-10-15 ENCOUNTER — Ambulatory Visit: Payer: Medicare Other

## 2024-10-18 ENCOUNTER — Encounter
